# Patient Record
Sex: Female | Born: 1960 | Race: White | Hispanic: No | Smoking: Never smoker
Health system: Southern US, Community
[De-identification: ages and names within clinical notes are randomized; demographics above are authoritative.]

## PROBLEM LIST (undated history)

## (undated) DIAGNOSIS — R32 Unspecified urinary incontinence: Secondary | ICD-10-CM

## (undated) DIAGNOSIS — I499 Cardiac arrhythmia, unspecified: Secondary | ICD-10-CM

## (undated) DIAGNOSIS — R928 Other abnormal and inconclusive findings on diagnostic imaging of breast: Secondary | ICD-10-CM

## (undated) DIAGNOSIS — E039 Hypothyroidism, unspecified: Secondary | ICD-10-CM

## (undated) DIAGNOSIS — I4891 Unspecified atrial fibrillation: Secondary | ICD-10-CM

## (undated) DIAGNOSIS — C801 Malignant (primary) neoplasm, unspecified: Secondary | ICD-10-CM

## (undated) HISTORY — DX: Unspecified urinary incontinence: R32

## (undated) HISTORY — PX: MAXIMUM ACCESS (MAS)POSTERIOR LUMBAR INTERBODY FUSION (PLIF) 3 LEVEL: SHX6370

## (undated) HISTORY — DX: Hypothyroidism, unspecified: E03.9

## (undated) HISTORY — DX: Malignant (primary) neoplasm, unspecified: C80.1

## (undated) HISTORY — PX: COLONOSCOPY: SHX174

## (undated) HISTORY — PX: URETHRAL SLING: SHX2621

## (undated) HISTORY — PX: BASAL CELL CARCINOMA EXCISION: SHX1214

---
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
## 1998-08-10 ENCOUNTER — Ambulatory Visit (HOSPITAL_COMMUNITY): Admission: RE | Admit: 1998-08-10 | Discharge: 1998-08-10 | Payer: Self-pay | Admitting: Gastroenterology

## 1999-02-02 ENCOUNTER — Ambulatory Visit (HOSPITAL_COMMUNITY): Admission: RE | Admit: 1999-02-02 | Discharge: 1999-02-02 | Payer: Self-pay | Admitting: Gynecology

## 2001-02-06 ENCOUNTER — Encounter: Payer: Self-pay | Admitting: Family Medicine

## 2001-02-06 ENCOUNTER — Encounter: Admission: RE | Admit: 2001-02-06 | Discharge: 2001-02-06 | Payer: Self-pay | Admitting: Family Medicine

## 2003-11-26 ENCOUNTER — Other Ambulatory Visit: Admission: RE | Admit: 2003-11-26 | Discharge: 2003-11-26 | Payer: Self-pay | Admitting: Gynecology

## 2003-11-29 HISTORY — PX: ENDOMETRIAL ABLATION: SHX621

## 2004-04-28 HISTORY — PX: PELVIC LAPAROSCOPY: SHX162

## 2004-05-04 ENCOUNTER — Ambulatory Visit (HOSPITAL_COMMUNITY): Admission: RE | Admit: 2004-05-04 | Discharge: 2004-05-04 | Payer: Self-pay | Admitting: Gynecology

## 2004-05-04 ENCOUNTER — Encounter (INDEPENDENT_AMBULATORY_CARE_PROVIDER_SITE_OTHER): Payer: Self-pay | Admitting: Specialist

## 2004-05-04 ENCOUNTER — Ambulatory Visit (HOSPITAL_BASED_OUTPATIENT_CLINIC_OR_DEPARTMENT_OTHER): Admission: RE | Admit: 2004-05-04 | Discharge: 2004-05-04 | Payer: Self-pay | Admitting: Gynecology

## 2004-11-26 ENCOUNTER — Other Ambulatory Visit: Admission: RE | Admit: 2004-11-26 | Discharge: 2004-11-26 | Payer: Self-pay | Admitting: Gynecology

## 2006-06-22 ENCOUNTER — Other Ambulatory Visit: Admission: RE | Admit: 2006-06-22 | Discharge: 2006-06-22 | Payer: Self-pay | Admitting: Gynecology

## 2007-06-27 ENCOUNTER — Other Ambulatory Visit: Admission: RE | Admit: 2007-06-27 | Discharge: 2007-06-27 | Payer: Self-pay | Admitting: Gynecology

## 2008-01-19 ENCOUNTER — Emergency Department (HOSPITAL_COMMUNITY): Admission: EM | Admit: 2008-01-19 | Discharge: 2008-01-19 | Payer: Self-pay | Admitting: Emergency Medicine

## 2008-11-27 ENCOUNTER — Emergency Department (HOSPITAL_BASED_OUTPATIENT_CLINIC_OR_DEPARTMENT_OTHER): Admission: EM | Admit: 2008-11-27 | Discharge: 2008-11-27 | Payer: Self-pay | Admitting: Emergency Medicine

## 2008-11-27 ENCOUNTER — Ambulatory Visit: Payer: Self-pay | Admitting: Diagnostic Radiology

## 2009-06-10 ENCOUNTER — Encounter: Payer: Self-pay | Admitting: Gynecology

## 2009-06-10 ENCOUNTER — Ambulatory Visit: Payer: Self-pay | Admitting: Gynecology

## 2009-06-10 ENCOUNTER — Other Ambulatory Visit: Admission: RE | Admit: 2009-06-10 | Discharge: 2009-06-10 | Payer: Self-pay | Admitting: Gynecology

## 2009-07-02 ENCOUNTER — Ambulatory Visit: Payer: Self-pay | Admitting: Gynecology

## 2010-10-05 ENCOUNTER — Ambulatory Visit: Payer: Self-pay | Admitting: Gynecology

## 2010-10-05 ENCOUNTER — Other Ambulatory Visit: Admission: RE | Admit: 2010-10-05 | Discharge: 2010-10-05 | Payer: Self-pay | Admitting: Gynecology

## 2010-11-28 DIAGNOSIS — C801 Malignant (primary) neoplasm, unspecified: Secondary | ICD-10-CM

## 2010-11-28 HISTORY — DX: Malignant (primary) neoplasm, unspecified: C80.1

## 2010-12-19 ENCOUNTER — Encounter: Payer: Self-pay | Admitting: Gynecology

## 2011-04-15 NOTE — H&P (Signed)
NAME:  Emily Nunez, Emily Nunez                         ACCOUNT NO.:  [id number]   MEDICAL RECORD NO.:  [id number]                   PATIENT TYPE:  AMB   LOCATION:  [facility]                                 FACILITY:  [facility]   PHYSICIAN:  Timothy P. Fontaine, M.D.           DATE OF BIRTH:  Jan 21, 1961   DATE OF ADMISSION:  05/04/2004  DATE OF DISCHARGE:                                HISTORY & PHYSICAL   CHIEF COMPLAINT:  Left-sided pain.   HISTORY OF PRESENT ILLNESS:  A 50 year old, G3, P3 female, vasectomy birth  control, history of endometrial ablation with left-sided pelvic pain.  The  patient notes intermittent sharp stabbing to aching pain since the fall, has  been evaluated with several ultrasounds showing normal pelvic pathology, no  significant ovarian disease.  She underwent abdominopelvic ultrasound which  was normal.  The patient is admitted at this time for diagnostic laparoscopy  for her pain.   PAST MEDICAL HISTORY:  Hypothyroidism.   PAST SURGICAL HISTORY:  1. Cryosurgery of the cervix.  2. Spontaneous vaginal delivery x 2.  3. Hysteroscopic myomectomy.  4. Endometrial ablation.   MEDICATIONS:  Synthroid.   ALLERGIES:  None.   REVIEW OF SYSTEMS:  Noncontributory.   SOCIAL HISTORY:  Noncontributory.   FAMILY HISTORY:  Noncontributory.   ADMISSION PHYSICAL EXAMINATION:  VITAL SIGNS:  Afebrile.  Vital signs are  stable.  HEENT:  Normal.  LUNGS:  Clear.  CARDIAC:  Regular rate without rubs, murmurs, or gallops.  ABDOMEN:  Benign.  PELVIC:  External BUS vagina normal.  Cervix normal.  Uterus grossly normal  size, nontender.  Adnexa without masses or tenderness.   ASSESSMENT AND PLAN:  A 50 year old, G3, P3 female, vasectomy birth control,  status post endometrial ablation, left-sided abdominopelvic pain since the  fall, normal ultrasound, normal CT for diagnostic laparoscopy.  The risks,  benefits, indications, and alternatives for the procedure were reviewed with  the  patient to include the expected intraoperative, postoperative courses.  The patient understands there are no guarantees as far as pain relief is  concerned.  The pain may persist, worsen, or change following the procedure,  and she understands and accepts this.  I reviewed instrumentation,  insufflation, trocar placement, multiple port site, use of sharp, blunt  dissection, electrocautery, and the use of laser.  The risks of infection  both incisional requiring opening and draining of incisions as well as  internal requiring prolonged antibiotics and abscess formation were all  discussed, understood, and accepted.  The risks of hemorrhage necessitating  transfusion and the risk of transfusion including transfusion reaction,  hepatitis, HIV, mad cow disease, and other unknown entities were all  discussed, understood, and accepted.  The risks of inadvertent injury to  internal organs could involve bladder, ureters, vessels, and nerves  necessitating major exploratory reparative surgeries and future reparative  surgeries including ostomy formation were all discussed, understood, and  accepted.  I discussed immediate  recognition versus delayed recognition of  complications and, again, we discussed there were no guarantees as far as  pain relief, and she understands and accepts this.  The patient's questions  are answered to her satisfaction.  She is ready to proceed with surgery.                                               Timothy P. Audie Box, M.D.    TPF/MEDQ  D:  05/02/2004  T:  05/02/2004  Job:  [id number]

## 2011-04-15 NOTE — Op Note (Signed)
NAME:  Emily Nunez, Emily Nunez                         ACCOUNT NO.:  [id number]   MEDICAL RECORD NO.:  [id number]                   PATIENT TYPE:  AMB   LOCATION:  [facility]                                 FACILITY:  [facility]   PHYSICIAN:  Timothy P. Fontaine, M.D.           DATE OF BIRTH:  01/10/1961   DATE OF PROCEDURE:  05/04/2004  DATE OF DISCHARGE:                                 OPERATIVE REPORT   PREOPERATIVE DIAGNOSIS:  Pelvic pain.   POSTOPERATIVE DIAGNOSES:  1. Endometriosis.  2. Right ovarian cyst, rule out endometrioma.   PROCEDURE:  Laparoscopic right ovarian cystectomy, biopsy fulguration of  endometriosis.   SURGEON:  Timothy P. Fontaine, M.D.   ANESTHESIA:  General.   ESTIMATED BLOOD LOSS:  Minimal.   COMPLICATIONS:  None.   SPECIMENS:  1. Peritoneal biopsy.  2. Right ovarian cyst.   FINDINGS:  CUL-DE-SAC:  Anterior cul-de-sac normal.  Posterior cul-de-sac  with four areas classic, shaggy, red superficial endometriosis; right side,  medial to the uterosacral ligament -- represented area of biopsy and  subsequent all visible areas bipolar cauterized.  FALLOPIAN TUBES:  Right and left fallopian tubes normal in caliber to  fimbriated ends.  OVARIES:  Left ovary grossly normal, free and mobile.  Right ovary with 2 cm  dark, hemorrhagic-appearing cyst -- excised and filled with dark clot.  Rule  out endometrioma.  UTERUS:  Normal size, shape and contour.  No pelvic adhesions noted, and no  other areas of endometriosis.  UPPER ABDOMINAL EXAMINATION:  Appendix grossly normal, free and mobile.  Liver smooth with no abnormalities.  Gallbladder not visualized, but no  upper abdominal abnormalities grossly visualized.   DESCRIPTION OF PROCEDURE:  The patient was taken to the operating room and  underwent general anesthesia.  She was placed in the dorsal lithotomy  position and received an abdominal, perineal, vaginal preparation with  Betadine solution.  The bladder emptied  with an in/out Foley  catheterization.  EUA performed.   The Hulka tenaculum was placed on the cervix, and the patient was then  draped in the usual fashion.  A vertical infraumbilical incision was then  made.  The Veress needle placed and position verified with water;  approximately 3 L of carbon dioxide gas were infused without difficulty.  The 10 mm laparoscopic trocar was then placed without difficulty and its  position verified visually.  A midline 5 mm suprapubic port was then placed  under direct visualization, after transillumination for the vessels without  difficulty.  Examination of the pelvic organs and an upper abdominal  examination was carried out, with findings noted above.  A representative  area of the posterior cul-de-sac endometriosis was biopsied, and  subsequently all visible areas were then bipolar coagulated superficially.   The right ovary was then grasped and stabilized, and at the junction of the  cyst and the normal-appearing ovary, the capsule was sharply incised and  subsequently the cyst  was excised.  The cyst was ruptured and noted to be  filled with clot, consistent either with endometrioma versus a clotted  corpus luteal.  The cyst wall was then removed and sent to pathology.  The  pelvis was irrigated and bipolar cautery was applied to the ovarian edge  surrounding the excision area, to achieve hemostasis.  The base of the  ovarian stroma was also bipolar cauterized in a brushing technique -- both  for hemostasis and to ablate any remaining cyst wall.   The pelvis was copiously irrigated.  Adequate hemostasis was visualized at  all areas.  The suprapubic port was then removed.  The gas allowed to  escape.  All sites were reinspected under __________ situation, showing  adequate hemostasis.  The infraumbilical port was backed out under direct  visualization, showing adequate hemostasis and no evidence of hernia  formation.  A 0 Vicryl interrupted  subcutaneous fascial stitch was placed  infraumbilically.  Both port sites were injected with 0.25% Marcaine.  The  skin was reapproximated using Dermabond skin adhesive.  The Hulka tenaculum  was removed.  The patient placed in supine position and awakened without  difficulty.  She went to the recovery room in good condition, having  tolerated the procedure well.                                               Timothy P. Audie Box, M.D.    TPF/MEDQ  D:  05/04/2004  T:  05/04/2004  Job:  [id number]

## 2011-09-02 LAB — BASIC METABOLIC PANEL
BUN: 16 mg/dL (ref 6–23)
CO2: 30 mEq/L (ref 19–32)
Calcium: 9.6 mg/dL (ref 8.4–10.5)
Chloride: 103 mEq/L (ref 96–112)
Creatinine, Ser: 1 mg/dL (ref 0.4–1.2)
GFR calc Af Amer: >60 mL/min (ref >60)
GFR calc non Af Amer: 59 mL/min — ABNORMAL LOW (ref >60)
Glucose, Bld: 79 mg/dL (ref 70–99)
Potassium: 4 mEq/L (ref 3.5–5.1)
Sodium: 142 mEq/L (ref 135–145)

## 2011-09-02 LAB — URINALYSIS, ROUTINE W REFLEX MICROSCOPIC
Bilirubin Urine: NEGATIVE
Glucose, UA: NEGATIVE mg/dL
Ketones, ur: NEGATIVE mg/dL
Nitrite: NEGATIVE
Protein, ur: 30 mg/dL — AB
Specific Gravity, Urine: 1.022 (ref 1.005–1.030)
Urobilinogen, UA: 0.2 mg/dL (ref 0.0–1.0)
pH: 5.5 (ref 5.0–8.0)

## 2011-09-02 LAB — CBC
HCT: 36.9 % (ref 36.0–46.0)
Hemoglobin: 12.5 g/dL (ref 12.0–15.0)
MCHC: 33.8 g/dL (ref 30.0–36.0)
MCV: 87.9 fL (ref 78.0–100.0)
Platelets: 217 10*3/uL (ref 150–400)
RBC: 4.2 MIL/uL (ref 3.87–5.11)
RDW: 12.7 % (ref 11.5–15.5)
WBC: 7.5 10*3/uL (ref 4.0–10.5)

## 2011-09-02 LAB — URINE MICROSCOPIC-ADD ON

## 2011-09-02 LAB — PREGNANCY, URINE: Preg Test, Ur: NEGATIVE

## 2011-09-02 LAB — DIFFERENTIAL
Basophils Absolute: 0 10*3/uL (ref 0.0–0.1)
Basophils Relative: 0 % (ref 0–1)
Eosinophils Absolute: 0 10*3/uL (ref 0.0–0.7)
Eosinophils Relative: 1 % (ref 0–5)
Lymphocytes Relative: 12 % (ref 12–46)
Lymphs Abs: 0.9 10*3/uL (ref 0.7–4.0)
Monocytes Absolute: 0.5 10*3/uL (ref 0.1–1.0)
Monocytes Relative: 7 % (ref 3–12)
Neutro Abs: 6.1 10*3/uL (ref 1.7–7.7)
Neutrophils Relative %: 81 % — ABNORMAL HIGH (ref 43–77)

## 2011-09-02 LAB — URINE CULTURE: Colony Count: >=100000

## 2011-10-10 ENCOUNTER — Other Ambulatory Visit: Payer: Self-pay | Admitting: *Deleted

## 2011-10-10 MED ORDER — ESTRADIOL 0.1 MG/24HR TD PTTW: 1.0000 | MEDICATED_PATCH | TRANSDERMAL | Status: DC

## 2011-11-14 DIAGNOSIS — C801 Malignant (primary) neoplasm, unspecified: Secondary | ICD-10-CM | POA: Insufficient documentation

## 2011-11-14 DIAGNOSIS — E559 Vitamin D deficiency, unspecified: Secondary | ICD-10-CM | POA: Insufficient documentation

## 2011-11-14 DIAGNOSIS — E039 Hypothyroidism, unspecified: Secondary | ICD-10-CM | POA: Insufficient documentation

## 2011-11-23 ENCOUNTER — Ambulatory Visit (INDEPENDENT_AMBULATORY_CARE_PROVIDER_SITE_OTHER): Payer: BC Managed Care – PPO | Admitting: Gynecology

## 2011-11-23 ENCOUNTER — Encounter: Payer: Self-pay | Admitting: Gynecology

## 2011-11-23 VITALS — BP 118/74 | Ht 65.25 in | Wt 224.0 lb

## 2011-11-23 DIAGNOSIS — Z79890 Hormone replacement therapy (postmenopausal): Secondary | ICD-10-CM

## 2011-11-23 DIAGNOSIS — Z01419 Encounter for gynecological examination (general) (routine) without abnormal findings: Secondary | ICD-10-CM

## 2011-11-23 MED ORDER — PROGESTERONE MICRONIZED 100 MG PO CAPS: 100.0000 mg | ORAL_CAPSULE | Freq: Every day | ORAL | Status: DC

## 2011-11-23 MED ORDER — ESTRADIOL 0.1 MG/24HR TD PTTW: 1.0000 | MEDICATED_PATCH | TRANSDERMAL | Status: DC

## 2011-11-23 NOTE — Patient Instructions (Signed)
Continue on hormone replacement as discussed. Increase calcium vitamin D. Follow up in one year or sooner if any issues.

## 2011-11-23 NOTE — Progress Notes (Signed)
Emily Nunez 21-Mar-1961 [id number]        50 y.o.  for annual exam.  Doing well on HRT.  Past medical history,surgical history, medications, allergies, family history and social history were all reviewed and documented in the EPIC chart. ROS:  Was performed and pertinent positives and negatives are included in the history.  Exam: chaperone present Filed Vitals:   11/23/11 1438  BP: 118/74   General appearance  Normal Skin grossly normal Head/Neck normal with no cervical or supraclavicular adenopathy thyroid normal Lungs  clear Cardiac RR, without RMG Abdominal  soft, nontender, without masses, organomegaly or hernia Breasts  examined lying and sitting without masses, retractions, discharge or axillary adenopathy. Pelvic  Ext/BUS/vagina  normal   Cervix  normal    Uterus  A Deaver did, normal size, shape and contour, midline and mobile nontender   Adnexa  Without masses or tenderness    Anus and perineum  normal   Rectovaginal  normal sphincter tone without palpated masses or tenderness.    Assessment/Plan:  50 y.o. female for annual exam.    1. HRT. Patient is doing well on HRT  Vivelle-Dot 0.1 mg and Prometrium 100 mg nightly. I again reviewed the WHI study. Increased risk of stroke, heart attack, and DVT and breast cancer risk reviewed. ACOG and NAMS statements the lowest dose for shortest period of time reviewed. Possibly decreasing her dose was also reviewed. She's not interested in doing it right now andshe'll prefer to continue another year accepting the risks. I refilled her times a year. 2. Breast health. SBE monthly reviewed. She's due for mammogram now and knows to schedule it and agrees to do so. 3. Pap smear. I did not do a Pap smear today. She has no history of significantly abnormal Pap smears with a number of normal reports in her chart the last one November 2011. We'll plan on every three-year screening she's comfortable with this. 4. Bone health. Increase calcium  vitamin D reviewed. She is known to have low vitamin D level and I encouraged her to make sure she's taking her extra vitamin D. I did not recheck her vitamin D level today as she does get blood work done through her primaryand no blood work was done today. I did remind her to follow up on this when they do blood work. 5. Colonoscopy. She had a colonoscopy in 2011 with planned repeat at 5 year interval. 6. Health maintenance. Again no blood work was done today as it is done through her primary she actively sees who follows her for her cholesterol, glucose, thyroid and general health maintenance.Dara Lords MD, 3:38 PM 11/23/2011

## 2011-11-24 ENCOUNTER — Encounter: Payer: Self-pay | Admitting: Gynecology

## 2012-05-29 ENCOUNTER — Other Ambulatory Visit: Payer: Self-pay | Admitting: Gynecology

## 2012-05-29 DIAGNOSIS — Z1231 Encounter for screening mammogram for malignant neoplasm of breast: Secondary | ICD-10-CM

## 2012-06-04 ENCOUNTER — Ambulatory Visit: Payer: BC Managed Care – PPO

## 2012-06-08 ENCOUNTER — Ambulatory Visit
Admission: RE | Admit: 2012-06-08 | Discharge: 2012-06-08 | Disposition: A | Discharge status: Home / Self Care | Payer: BC Managed Care – PPO | Source: Ambulatory Visit | Attending: Gynecology | Admitting: Gynecology

## 2012-06-08 DIAGNOSIS — Z1231 Encounter for screening mammogram for malignant neoplasm of breast: Secondary | ICD-10-CM

## 2012-12-29 ENCOUNTER — Other Ambulatory Visit: Payer: Self-pay | Admitting: Gynecology

## 2012-12-31 NOTE — Telephone Encounter (Signed)
Pt will be called to schedule annual exam. KW

## 2013-02-26 ENCOUNTER — Other Ambulatory Visit (HOSPITAL_COMMUNITY)
Admission: RE | Admit: 2013-02-26 | Discharge: 2013-02-26 | Disposition: A | Discharge status: Home / Self Care | Payer: BC Managed Care – PPO | Source: Ambulatory Visit | Attending: Gynecology | Admitting: Gynecology

## 2013-02-26 ENCOUNTER — Encounter: Payer: Self-pay | Admitting: Gynecology

## 2013-02-26 ENCOUNTER — Ambulatory Visit (INDEPENDENT_AMBULATORY_CARE_PROVIDER_SITE_OTHER): Payer: BC Managed Care – PPO | Admitting: Gynecology

## 2013-02-26 VITALS — BP 124/76 | Ht 65.0 in | Wt 222.0 lb

## 2013-02-26 DIAGNOSIS — Z79890 Hormone replacement therapy (postmenopausal): Secondary | ICD-10-CM

## 2013-02-26 DIAGNOSIS — Z01419 Encounter for gynecological examination (general) (routine) without abnormal findings: Secondary | ICD-10-CM

## 2013-02-26 DIAGNOSIS — Z1151 Encounter for screening for human papillomavirus (HPV): Secondary | ICD-10-CM | POA: Insufficient documentation

## 2013-02-26 DIAGNOSIS — N951 Menopausal and female climacteric states: Secondary | ICD-10-CM

## 2013-02-26 MED ORDER — ESTRADIOL 0.1 MG/24HR TD PTTW: MEDICATED_PATCH | TRANSDERMAL | Status: DC

## 2013-02-26 MED ORDER — PROGESTERONE MICRONIZED 100 MG PO CAPS: 100.0000 mg | ORAL_CAPSULE | Freq: Every day | ORAL | Status: DC

## 2013-02-26 NOTE — Progress Notes (Signed)
Emily Nunez 21-Oct-1961 [id number]        52 y.o.  G3P3003 for annual exam.  Several issues noted below  Past medical history,surgical history, medications, allergies, family history and social history were all reviewed and documented in the EPIC chart. ROS:  Was performed and pertinent positives and negatives are included in the history.  Exam: Kim assistant Filed Vitals:   02/26/13 1603  BP: 124/76  Height: 5\' 5"  (1.651 m)  Weight: 222 lb (100.699 kg)   General appearance  Normal Skin grossly normal Head/Neck normal with no cervical or supraclavicular adenopathy thyroid normal Lungs  clear Cardiac RR, without RMG Abdominal  soft, nontender, without masses, organomegaly or hernia Breasts  examined lying and sitting without masses, retractions, discharge or axillary adenopathy. Pelvic  Ext/BUS/vagina  normal   Cervix  normal Pap/HPV  Uterus  axial, normal size, shape and contour, midline and mobile nontender   Adnexa  Without masses or tenderness    Anus and perineum  normal   Rectovaginal  normal sphincter tone without palpated masses or tenderness.    Assessment/Plan:  52 y.o. G22P3003 female for annual exam.   1. HRT. Patient on Vivelle 0.1 mg patch and Prometrium 100 mg nightly. She ran out of her medication and notes that she's having hot flashes, sweats, fatigue and just overall not feeling as well. Options to stay off the medication versus reinitiating were reviewed. I again discussed the risks to include the WHI study with increased risk of stroke heart attack DVT and breast cancer. ACOG and NAMS statements for lowest dose for shortest period of time reviewed. Patient wants to reinitiate understanding and accepting the risks. Vivelle 0.1 mg patch and from Prometrium 100 mg nightly prescribed times one year. Patient knows to report any bleeding 2. Pap smear 2011. Pap/HPV done today. No history of significant abnormal Pap smears. Assuming negative plan less frequent screening  interval. 3. Mammography 05/2012. Continue with annual mammography. SBE monthly reviewed. 4. Colonoscopy 2 years ago. Repeat at their recommended interval. 5. DEXA. We'll plan DEXA baseline closer to 60. Increase calcium vitamin D reviewed. 6. Health maintenance. Actively sees her primary physician and is due to see them. No blood work done and she will have this done through their office. I did recommend that she have a vitamin D checked when she has her blood drawn she has been noted to be low previously. Patient will followup in one year, sooner as needed.    Dara Lords MD, 5:08 PM 02/26/2013

## 2013-02-26 NOTE — Patient Instructions (Addendum)
Follow up in one year for annual exam Follow up sooner in any issues Have Dr Foy [place] check a Vitamin D level when he does blood work

## 2013-02-27 ENCOUNTER — Encounter: Payer: Self-pay | Admitting: Gynecology

## 2013-02-27 LAB — URINALYSIS W MICROSCOPIC + REFLEX CULTURE
Bilirubin Urine: NEGATIVE
Casts: NONE SEEN
Crystals: NONE SEEN
Glucose, UA: NEGATIVE mg/dL
Hgb urine dipstick: NEGATIVE
Ketones, ur: NEGATIVE mg/dL
Leukocytes, UA: NEGATIVE
Nitrite: NEGATIVE
Protein, ur: NEGATIVE mg/dL
Specific Gravity, Urine: 1.023 (ref 1.005–1.030)
Urobilinogen, UA: 0.2 mg/dL (ref 0.0–1.0)
pH: 5 (ref 5.0–8.0)

## 2013-02-28 LAB — URINE CULTURE
Colony Count: NO GROWTH
Organism ID, Bacteria: NO GROWTH

## 2013-06-03 ENCOUNTER — Other Ambulatory Visit: Payer: Self-pay | Admitting: Family Medicine

## 2013-06-04 ENCOUNTER — Other Ambulatory Visit: Payer: Self-pay | Admitting: Family Medicine

## 2013-06-04 DIAGNOSIS — Z1231 Encounter for screening mammogram for malignant neoplasm of breast: Secondary | ICD-10-CM

## 2013-06-24 ENCOUNTER — Ambulatory Visit: Payer: BC Managed Care – PPO

## 2013-06-26 ENCOUNTER — Ambulatory Visit: Payer: BC Managed Care – PPO

## 2013-07-26 ENCOUNTER — Ambulatory Visit: Payer: BC Managed Care – PPO

## 2013-08-29 ENCOUNTER — Other Ambulatory Visit: Payer: Self-pay | Admitting: Family Medicine

## 2013-08-29 DIAGNOSIS — G930 Cerebral cysts: Secondary | ICD-10-CM

## 2013-09-06 ENCOUNTER — Other Ambulatory Visit: Payer: BC Managed Care – PPO

## 2014-03-26 ENCOUNTER — Other Ambulatory Visit: Payer: Self-pay | Admitting: Gynecology

## 2014-05-19 ENCOUNTER — Encounter: Payer: Self-pay | Admitting: Gynecology

## 2014-05-19 ENCOUNTER — Ambulatory Visit (INDEPENDENT_AMBULATORY_CARE_PROVIDER_SITE_OTHER): Payer: BC Managed Care – PPO | Admitting: Gynecology

## 2014-05-19 VITALS — BP 110/70 | Ht 66.0 in | Wt 214.0 lb

## 2014-05-19 DIAGNOSIS — Z79890 Hormone replacement therapy (postmenopausal): Secondary | ICD-10-CM

## 2014-05-19 DIAGNOSIS — Z01419 Encounter for gynecological examination (general) (routine) without abnormal findings: Secondary | ICD-10-CM

## 2014-05-19 MED ORDER — ESTRADIOL 0.1 MG/24HR TD PTTW: MEDICATED_PATCH | TRANSDERMAL | Status: DC

## 2014-05-19 MED ORDER — PROGESTERONE MICRONIZED 100 MG PO CAPS: 100.0000 mg | ORAL_CAPSULE | Freq: Every day | ORAL | Status: DC

## 2014-05-19 NOTE — Progress Notes (Signed)
Emily Nunez [birth year] [id number]        52 y.o.  G3P3003 for annual exam.  Several issues noted below.  Past medical history,surgical history, problem list, medications, allergies, family history and social history were all reviewed and documented as reviewed in the EPIC chart.  ROS:  12 system ROS performed with pertinent positives and negatives included in the history, assessment and plan.  Included Systems: General, HEENT, Neck, Cardiovascular, Pulmonary, Gastrointestinal, Genitourinary, Musculoskeletal, Dermatologic, Endocrine, Hematological, Neurologic, Psychiatric Additional significant findings :  None   Exam: Kim assistant Filed Vitals:   05/19/14 1144  BP: 110/70  Height: 5\' 6"  (1.676 m)  Weight: 214 lb (97.07 kg)   General appearance:  Normal affect, orientation and appearance. Skin: Grossly normal HEENT: Without gross lesions.  No cervical or supraclavicular adenopathy. Thyroid normal.  Lungs:  Clear without wheezing, rales or rhonchi Cardiac: RR, without RMG Abdominal:  Soft, nontender, without masses, guarding, rebound, organomegaly or hernia Breasts:  Examined lying and sitting without masses, retractions, discharge or axillary adenopathy. Pelvic:  Ext/BUS/vagina normal  Cervix normal  Uterus anteverted, normal size, shape and contour, midline and mobile nontender   Adnexa  Without masses or tenderness    Anus and perineum  Normal   Rectovaginal  Normal sphincter tone without palpated masses or tenderness.    Assessment/Plan:  53 y.o. G55P3003 female for annual exam.   1. Postmenopausal/HRT. Status post endometrial ablation. Patient continues on Vivelle 0.1 mg patches and Prometrium 100 mg nightly. No vaginal bleeding. Good relief of her hot flushes and night sweats.  I again reviewed the whole issue of HRT with her to include the WHI study with increased risk of stroke, heart attack, DVT and breast cancer. The ACOG and NAMS statements for lowest dose for the  shortest period of time reviewed. Options to try to wean now versus continuing reviewed. Patient wants to continue at this point and I refilled her for both medications x1 year. Patient knows to call if any vaginal bleeding. 2. Pap smear/HPV negative 2014. No Pap smear done today. No history of significant abnormal Pap smears. Plan repeat Pap smear in 3-5 year interval per current screening guidelines. 3. Mammography 05/2012. Patient knows she is overdue. Agrees to schedule now. SBE monthly reviewed. 4. Colonoscopy 2013. Repeat at their recommended interval. 5. DEXA never. Will plan at age 3. Increase calcium vitamin D reviewed. 6. Health maintenance. No routine blood work done as patient has this done through her primary physician's office. Followup one year, sooner as needed.   Note: This document was prepared with digital dictation and possible smart phrase technology. Any transcriptional errors that result from this process are unintentional.   Anastasio Auerbach MD, 12:36 PM 05/19/2014

## 2014-05-19 NOTE — Patient Instructions (Signed)
Followup in one year for annual exam. Call if any vaginal bleeding or issues with your hormone replacement.  You may obtain a copy of any labs that were done today by logging onto MyChart as outlined in the instructions provided with your AVS (after visit summary). The office will not call with normal lab results but certainly if there are any significant abnormalities then we will contact you.   Health Maintenance, Female A healthy lifestyle and preventative care can promote health and wellness.  Maintain regular health, dental, and eye exams.  Eat a healthy diet. Foods like vegetables, fruits, whole grains, low-fat dairy products, and lean protein foods contain the nutrients you need without too many calories. Decrease your intake of foods high in solid fats, added sugars, and salt. Get information about a proper diet from your caregiver, if necessary.  Regular physical exercise is one of the most important things you can do for your health. Most adults should get at least 150 minutes of moderate-intensity exercise (any activity that increases your heart rate and causes you to sweat) each week. In addition, most adults need muscle-strengthening exercises on 2 or more days a week.   Maintain a healthy weight. The body mass index (BMI) is a screening tool to identify possible weight problems. It provides an estimate of body fat based on height and weight. Your caregiver can help determine your BMI, and can help you achieve or maintain a healthy weight. For adults 20 years and older:  A BMI below 18.5 is considered underweight.  A BMI of 18.5 to 24.9 is normal.  A BMI of 25 to 29.9 is considered overweight.  A BMI of 30 and above is considered obese.  Maintain normal blood lipids and cholesterol by exercising and minimizing your intake of saturated fat. Eat a balanced diet with plenty of fruits and vegetables. Blood tests for lipids and cholesterol should begin at age 54 and be repeated every  5 years. If your lipid or cholesterol levels are high, you are over 50, or you are a high risk for heart disease, you may need your cholesterol levels checked more frequently.Ongoing high lipid and cholesterol levels should be treated with medicines if diet and exercise are not effective.  If you smoke, find out from your caregiver how to quit. If you do not use tobacco, do not start.  Lung cancer screening is recommended for adults aged 12 80 years who are at high risk for developing lung cancer because of a history of smoking. Yearly low-dose computed tomography (CT) is recommended for people who have at least a 30-pack-year history of smoking and are a current smoker or have quit within the past 15 years. A pack year of smoking is smoking an average of 1 pack of cigarettes a day for 1 year (for example: 1 pack a day for 30 years or 2 packs a day for 15 years). Yearly screening should continue until the smoker has stopped smoking for at least 15 years. Yearly screening should also be stopped for people who develop a health problem that would prevent them from having lung cancer treatment.  If you are pregnant, do not drink alcohol. If you are breastfeeding, be very cautious about drinking alcohol. If you are not pregnant and choose to drink alcohol, do not exceed 1 drink per day. One drink is considered to be 12 ounces (355 mL) of beer, 5 ounces (148 mL) of wine, or 1.5 ounces (44 mL) of liquor.  Avoid use of  street drugs. Do not share needles with anyone. Ask for help if you need support or instructions about stopping the use of drugs.  High blood pressure causes heart disease and increases the risk of stroke. Blood pressure should be checked at least every 1 to 2 years. Ongoing high blood pressure should be treated with medicines, if weight loss and exercise are not effective.  If you are 55 to 53 years old, ask your caregiver if you should take aspirin to prevent strokes.  Diabetes screening  involves taking a blood sample to check your fasting blood sugar level. This should be done once every 3 years, after age 45, if you are within normal weight and without risk factors for diabetes. Testing should be considered at a younger age or be carried out more frequently if you are overweight and have at least 1 risk factor for diabetes.  Breast cancer screening is essential preventative care for women. You should practice "breast self-awareness." This means understanding the normal appearance and feel of your breasts and may include breast self-examination. Any changes detected, no matter how small, should be reported to a caregiver. Women in their 20s and 30s should have a clinical breast exam (CBE) by a caregiver as part of a regular health exam every 1 to 3 years. After age 40, women should have a CBE every year. Starting at age 40, women should consider having a mammogram (breast X-ray) every year. Women who have a family history of breast cancer should talk to their caregiver about genetic screening. Women at a high risk of breast cancer should talk to their caregiver about having an MRI and a mammogram every year.  Breast cancer gene (BRCA)-related cancer risk assessment is recommended for women who have family members with BRCA-related cancers. BRCA-related cancers include breast, ovarian, tubal, and peritoneal cancers. Having family members with these cancers may be associated with an increased risk for harmful changes (mutations) in the breast cancer genes BRCA1 and BRCA2. Results of the assessment will determine the need for genetic counseling and BRCA1 and BRCA2 testing.  The Pap test is a screening test for cervical cancer. Women should have a Pap test starting at age 21. Between ages 21 and 29, Pap tests should be repeated every 2 years. Beginning at age 30, you should have a Pap test every 3 years as long as the past 3 Pap tests have been normal. If you had a hysterectomy for a problem that  was not cancer or a condition that could lead to cancer, then you no longer need Pap tests. If you are between ages 65 and 70, and you have had normal Pap tests going back 10 years, you no longer need Pap tests. If you have had past treatment for cervical cancer or a condition that could lead to cancer, you need Pap tests and screening for cancer for at least 20 years after your treatment. If Pap tests have been discontinued, risk factors (such as a new sexual partner) need to be reassessed to determine if screening should be resumed. Some women have medical problems that increase the chance of getting cervical cancer. In these cases, your caregiver may recommend more frequent screening and Pap tests.  The human papillomavirus (HPV) test is an additional test that may be used for cervical cancer screening. The HPV test looks for the virus that can cause the cell changes on the cervix. The cells collected during the Pap test can be tested for HPV. The HPV test could   be used to screen women aged 67 years and older, and should be used in women of any age who have unclear Pap test results. After the age of 12, women should have HPV testing at the same frequency as a Pap test.  Colorectal cancer can be detected and often prevented. Most routine colorectal cancer screening begins at the age of 26 and continues through age 56. However, your caregiver may recommend screening at an earlier age if you have risk factors for colon cancer. On a yearly basis, your caregiver may provide home test kits to check for hidden blood in the stool. Use of a small camera at the end of a tube, to directly examine the colon (sigmoidoscopy or colonoscopy), can detect the earliest forms of colorectal cancer. Talk to your caregiver about this at age 6, when routine screening begins. Direct examination of the colon should be repeated every 5 to 10 years through age 67, unless early forms of pre-cancerous polyps or small growths are  found.  Hepatitis C blood testing is recommended for all people born from [birth year] through 1965 and any individual with known risks for hepatitis C.  Practice safe sex. Use condoms and avoid high-risk sexual practices to reduce the spread of sexually transmitted infections (STIs). Sexually active women aged 8 and younger should be checked for Chlamydia, which is a common sexually transmitted infection. Older women with new or multiple partners should also be tested for Chlamydia. Testing for other STIs is recommended if you are sexually active and at increased risk.  Osteoporosis is a disease in which the bones lose minerals and strength with aging. This can result in serious bone fractures. The risk of osteoporosis can be identified using a bone density scan. Women ages 69 and over and women at risk for fractures or osteoporosis should discuss screening with their caregivers. Ask your caregiver whether you should be taking a calcium supplement or vitamin D to reduce the rate of osteoporosis.  Menopause can be associated with physical symptoms and risks. Hormone replacement therapy is available to decrease symptoms and risks. You should talk to your caregiver about whether hormone replacement therapy is right for you.  Use sunscreen. Apply sunscreen liberally and repeatedly throughout the day. You should seek shade when your shadow is shorter than you. Protect yourself by wearing long sleeves, pants, a wide-brimmed hat, and sunglasses year round, whenever you are outdoors.  Notify your caregiver of new moles or changes in moles, especially if there is a change in shape or color. Also notify your caregiver if a mole is larger than the size of a pencil eraser.  Stay current with your immunizations. Document Released: 05/30/2011 Document Revised: 03/11/2013 Document Reviewed: 05/30/2011 [facility] Patient Information 2014 [place].

## 2014-05-20 LAB — URINALYSIS W MICROSCOPIC + REFLEX CULTURE
Bilirubin Urine: NEGATIVE
Casts: NONE SEEN
Crystals: NONE SEEN
Glucose, UA: NEGATIVE mg/dL
Hgb urine dipstick: NEGATIVE
Ketones, ur: NEGATIVE mg/dL
Leukocytes, UA: NEGATIVE
Nitrite: NEGATIVE
Protein, ur: NEGATIVE mg/dL
Specific Gravity, Urine: 1.028 (ref 1.005–1.030)
Urobilinogen, UA: 0.2 mg/dL (ref 0.0–1.0)
pH: 6 (ref 5.0–8.0)

## 2014-05-21 ENCOUNTER — Other Ambulatory Visit: Payer: Self-pay | Admitting: Gynecology

## 2014-05-21 MED ORDER — CIPROFLOXACIN HCL 250 MG PO TABS: 250.0000 mg | ORAL_TABLET | Freq: Two times a day (BID) | ORAL | Status: DC

## 2014-05-22 LAB — URINE CULTURE: Colony Count: 30000

## 2014-06-10 ENCOUNTER — Other Ambulatory Visit: Payer: Self-pay

## 2014-06-10 DIAGNOSIS — Z1231 Encounter for screening mammogram for malignant neoplasm of breast: Secondary | ICD-10-CM

## 2014-06-12 ENCOUNTER — Ambulatory Visit: Payer: BC Managed Care – PPO

## 2014-06-16 ENCOUNTER — Other Ambulatory Visit: Payer: Self-pay | Admitting: Physician Assistant

## 2014-06-19 ENCOUNTER — Ambulatory Visit (HOSPITAL_BASED_OUTPATIENT_CLINIC_OR_DEPARTMENT_OTHER)
Admission: RE | Admit: 2014-06-19 | Payer: BC Managed Care – PPO | Source: Ambulatory Visit | Admitting: Orthopedic Surgery

## 2014-06-19 ENCOUNTER — Encounter (HOSPITAL_BASED_OUTPATIENT_CLINIC_OR_DEPARTMENT_OTHER): Admission: RE | Payer: Self-pay | Source: Ambulatory Visit

## 2014-06-19 SURGERY — EXCISION, GANGLION CYST, WRIST
Anesthesia: General | Site: Wrist | Laterality: Right

## 2014-06-27 ENCOUNTER — Ambulatory Visit
Admission: RE | Admit: 2014-06-27 | Discharge: 2014-06-27 | Disposition: A | Discharge status: Home / Self Care | Payer: BC Managed Care – PPO | Source: Ambulatory Visit

## 2014-06-27 ENCOUNTER — Encounter (INDEPENDENT_AMBULATORY_CARE_PROVIDER_SITE_OTHER): Payer: Self-pay

## 2014-06-27 DIAGNOSIS — Z1231 Encounter for screening mammogram for malignant neoplasm of breast: Secondary | ICD-10-CM

## 2014-07-03 ENCOUNTER — Other Ambulatory Visit: Payer: Self-pay | Admitting: Family Medicine

## 2014-09-29 ENCOUNTER — Encounter: Payer: Self-pay | Admitting: Gynecology

## 2015-06-06 ENCOUNTER — Other Ambulatory Visit: Payer: Self-pay | Admitting: Gynecology

## 2015-06-16 ENCOUNTER — Other Ambulatory Visit: Payer: Self-pay

## 2015-06-16 MED ORDER — ESTRADIOL 0.1 MG/24HR TD PTTW: MEDICATED_PATCH | TRANSDERMAL | Status: DC

## 2015-06-16 NOTE — Telephone Encounter (Signed)
Has CE scheduled 08/17/15.

## 2015-08-14 ENCOUNTER — Encounter: Payer: Self-pay | Admitting: Gynecology

## 2015-08-14 ENCOUNTER — Ambulatory Visit (INDEPENDENT_AMBULATORY_CARE_PROVIDER_SITE_OTHER): Payer: BC Managed Care – PPO | Admitting: Gynecology

## 2015-08-14 VITALS — BP 118/76 | Ht 66.0 in | Wt 220.0 lb

## 2015-08-14 DIAGNOSIS — Z01419 Encounter for gynecological examination (general) (routine) without abnormal findings: Secondary | ICD-10-CM | POA: Diagnosis not present

## 2015-08-14 DIAGNOSIS — Z79890 Hormone replacement therapy (postmenopausal): Secondary | ICD-10-CM | POA: Diagnosis not present

## 2015-08-14 MED ORDER — PROGESTERONE MICRONIZED 100 MG PO CAPS: 100.0000 mg | ORAL_CAPSULE | Freq: Every day | ORAL | Status: DC

## 2015-08-14 MED ORDER — ESTRADIOL 0.1 MG/24HR TD PTTW: MEDICATED_PATCH | TRANSDERMAL | Status: DC

## 2015-08-14 NOTE — Patient Instructions (Signed)
Call to Schedule your mammogram  Facilities in [place]: 1)  [facility], [place] Phone: [phone] 2)  [facility]. [address] [place] Phone: [phone] 3)  Dr. Isaiah Blakes at [facility] [address] Phone: [phone]     Mammogram A mammogram is an X-ray test to find changes in a woman's breast. You should get a mammogram if:  You are 54 years of age or older  You have risk factors.   Your doctor recommends that you have one.  BEFORE THE TEST  Do not schedule the test the week before your period, especially if your breasts are sore during this time.  On the day of your mammogram:  Wash your breasts and armpits well. After washing, do not put on any deodorant or talcum powder on until after your test.   Eat and drink as you usually do.   Take your medicines as usual.   If you are diabetic and take insulin, make sure you:   Eat before coming for your test.   Take your insulin as usual.   If you cannot keep your appointment, call before the appointment to cancel. Schedule another appointment.  TEST  You will need to undress from the waist up. You will put on a hospital gown.   Your breast will be put on the mammogram machine, and it will press firmly on your breast with a piece of plastic called a compression paddle. This will make your breast flatter so that the machine can X-ray all parts of your breast.   Both breasts will be X-rayed. Each breast will be X-rayed from above and from the side. An X-ray might need to be taken again if the picture is not good enough.   The mammogram will last about 15 to 30 minutes.  AFTER THE TEST Finding out the results of your test Ask when your test results will be ready. Make sure you get your test results.  Document Released: 02/10/2009 Document Revised: 11/03/2011 Document Reviewed: 02/10/2009 [facility] Patient  Information 2012 [place].  You may obtain a copy of any labs that were done today by logging onto MyChart as outlined in the instructions provided with your AVS (after visit summary). The office will not call with normal lab results but certainly if there are any significant abnormalities then we will contact you.   Health Maintenance Adopting a healthy lifestyle and getting preventive care can go a long way to promote health and wellness. Talk with your health care provider about what schedule of regular examinations is right for you. This is a good chance for you to check in with your provider about disease prevention and staying healthy. In between checkups, there are plenty of things you can do on your own. Experts have done a lot of research about which lifestyle changes and preventive measures are most likely to keep you healthy. Ask your health care provider for more information. WEIGHT AND DIET  Eat a healthy diet  Be sure to include plenty of vegetables, fruits, low-fat dairy products, and lean protein.  Do not eat a lot of foods high in solid fats, added sugars, or salt.  Get regular exercise. This is one of the most important things you can do for your health.  Most adults should exercise for at least 150 minutes each week. The exercise should increase your heart rate and make you sweat (moderate-intensity exercise).  Most adults should also do strengthening exercises at least twice a week. This is in addition to the moderate-intensity exercise.  Maintain a healthy weight  Body mass index (BMI) is a measurement that can be used to identify possible weight problems. It estimates body fat based on height and weight. Your health care provider can help determine your BMI and help you achieve or maintain a healthy weight.  For females 93 years of age and older:   A BMI below 18.5 is considered underweight.  A BMI of 18.5 to 24.9 is normal.  A BMI of 25 to 29.9 is  considered overweight.  A BMI of 30 and above is considered obese.  Watch levels of cholesterol and blood lipids  You should start having your blood tested for lipids and cholesterol at 54 years of age, then have this test every 5 years.  You may need to have your cholesterol levels checked more often if:  Your lipid or cholesterol levels are high.  You are older than 54 years of age.  You are at high risk for heart disease.  CANCER SCREENING   Lung Cancer  Lung cancer screening is recommended for adults 71-65 years old who are at high risk for lung cancer because of a history of smoking.  A yearly low-dose CT scan of the lungs is recommended for people who:  Currently smoke.  Have quit within the past 15 years.  Have at least a 30-pack-year history of smoking. A pack year is smoking an average of one pack of cigarettes a day for 1 year.  Yearly screening should continue until it has been 15 years since you quit.  Yearly screening should stop if you develop a health problem that would prevent you from having lung cancer treatment.  Breast Cancer  Practice breast self-awareness. This means understanding how your breasts normally appear and feel.  It also means doing regular breast self-exams. Let your health care provider know about any changes, no matter how small.  If you are in your 20s or 30s, you should have a clinical breast exam (CBE) by a health care provider every 1-3 years as part of a regular health exam.  If you are 77 or older, have a CBE every year. Also consider having a breast X-ray (mammogram) every year.  If you have a family history of breast cancer, talk to your health care provider about genetic screening.  If you are at high risk for breast cancer, talk to your health care provider about having an MRI and a mammogram every year.  Breast cancer gene (BRCA) assessment is recommended for women who have family members with BRCA-related cancers.  BRCA-related cancers include:  Breast.  Ovarian.  Tubal.  Peritoneal cancers.  Results of the assessment will determine the need for genetic counseling and BRCA1 and BRCA2 testing. Cervical Cancer Routine pelvic examinations to screen for cervical cancer are no longer recommended for nonpregnant women who are considered low risk for cancer of the pelvic organs (ovaries, uterus, and vagina) and who do not have symptoms. A pelvic examination may be necessary if you have symptoms including those associated with pelvic infections. Ask your health care provider if a screening pelvic exam is right for you.   The Pap test is the screening test for cervical cancer for women who are considered at risk.  If you had a hysterectomy for a problem that was not cancer or a condition that could lead to cancer, then you no longer need  Pap tests.  If you are older than 65 years, and you have had normal Pap tests for the past 10 years, you no longer need to have Pap tests.  If you have had past treatment for cervical cancer or a condition that could lead to cancer, you need Pap tests and screening for cancer for at least 20 years after your treatment.  If you no longer get a Pap test, assess your risk factors if they change (such as having a new sexual partner). This can affect whether you should start being screened again.  Some women have medical problems that increase their chance of getting cervical cancer. If this is the case for you, your health care provider may recommend more frequent screening and Pap tests.  The human papillomavirus (HPV) test is another test that may be used for cervical cancer screening. The HPV test looks for the virus that can cause cell changes in the cervix. The cells collected during the Pap test can be tested for HPV.  The HPV test can be used to screen women 71 years of age and older. Getting tested for HPV can extend the interval between normal Pap tests from three to  five years.  An HPV test also should be used to screen women of any age who have unclear Pap test results.  After 54 years of age, women should have HPV testing as often as Pap tests.  Colorectal Cancer  This type of cancer can be detected and often prevented.  Routine colorectal cancer screening usually begins at 54 years of age and continues through 54 years of age.  Your health care provider may recommend screening at an earlier age if you have risk factors for colon cancer.  Your health care provider may also recommend using home test kits to check for hidden blood in the stool.  A small camera at the end of a tube can be used to examine your colon directly (sigmoidoscopy or colonoscopy). This is done to check for the earliest forms of colorectal cancer.  Routine screening usually begins at age 78.  Direct examination of the colon should be repeated every 5-10 years through 54 years of age. However, you may need to be screened more often if early forms of precancerous polyps or small growths are found. Skin Cancer  Check your skin from head to toe regularly.  Tell your health care provider about any new moles or changes in moles, especially if there is a change in a mole's shape or color.  Also tell your health care provider if you have a mole that is larger than the size of a pencil eraser.  Always use sunscreen. Apply sunscreen liberally and repeatedly throughout the day.  Protect yourself by wearing long sleeves, pants, a wide-brimmed hat, and sunglasses whenever you are outside. HEART DISEASE, DIABETES, AND HIGH BLOOD PRESSURE   Have your blood pressure checked at least every 1-2 years. High blood pressure causes heart disease and increases the risk of stroke.  If you are between 52 years and 72 years old, ask your health care provider if you should take aspirin to prevent strokes.  Have regular diabetes screenings. This involves taking a blood sample to check your  fasting blood sugar level.  If you are at a normal weight and have a low risk for diabetes, have this test once every three years after 54 years of age.  If you are overweight and have a high risk for diabetes, consider being tested at  a younger age or more often. PREVENTING INFECTION  Hepatitis B  If you have a higher risk for hepatitis B, you should be screened for this virus. You are considered at high risk for hepatitis B if:  You were born in a country where hepatitis B is common. Ask your health care Ricca Melgarejo which countries are considered high risk.  Your parents were born in a high-risk country, and you have not been immunized against hepatitis B (hepatitis B vaccine).  You have HIV or AIDS.  You use needles to inject street drugs.  You live with someone who has hepatitis B.  You have had sex with someone who has hepatitis B.  You get hemodialysis treatment.  You take certain medicines for conditions, including cancer, organ transplantation, and autoimmune conditions. Hepatitis C  Blood testing is recommended for:  Everyone born from [birth year] through 1965.  Anyone with known risk factors for hepatitis C. Sexually transmitted infections (STIs)  You should be screened for sexually transmitted infections (STIs) including gonorrhea and chlamydia if:  You are sexually active and are younger than 54 years of age.  You are older than 54 years of age and your health care Laveda Demedeiros tells you that you are at risk for this type of infection.  Your sexual activity has changed since you were last screened and you are at an increased risk for chlamydia or gonorrhea. Ask your health care Makyi Ledo if you are at risk.  If you do not have HIV, but are at risk, it may be recommended that you take a prescription medicine daily to prevent HIV infection. This is called pre-exposure prophylaxis (PrEP). You are considered at risk if:  You are sexually active and do not regularly use condoms or  know the HIV status of your partner(s).  You take drugs by injection.  You are sexually active with a partner who has HIV. Talk with your health care Keshana Klemz about whether you are at high risk of being infected with HIV. If you choose to begin PrEP, you should first be tested for HIV. You should then be tested every 3 months for as long as you are taking PrEP.  PREGNANCY   If you are premenopausal and you may become pregnant, ask your health care Robet Crutchfield about preconception counseling.  If you may become pregnant, take 400 to 800 micrograms (mcg) of folic acid every day.  If you want to prevent pregnancy, talk to your health care Nolon Yellin about birth control (contraception). OSTEOPOROSIS AND MENOPAUSE   Osteoporosis is a disease in which the bones lose minerals and strength with aging. This can result in serious bone fractures. Your risk for osteoporosis can be identified using a bone density scan.  If you are 31 years of age or older, or if you are at risk for osteoporosis and fractures, ask your health care Phallon Haydu if you should be screened.  Ask your health care Deshane Cotroneo whether you should take a calcium or vitamin D supplement to lower your risk for osteoporosis.  Menopause may have certain physical symptoms and risks.  Hormone replacement therapy may reduce some of these symptoms and risks. Talk to your health care Khori Underberg about whether hormone replacement therapy is right for you.  HOME CARE INSTRUCTIONS   Schedule regular health, dental, and eye exams.  Stay current with your immunizations.   Do not use any tobacco products including cigarettes, chewing tobacco, or electronic cigarettes.  If you are pregnant, do not drink alcohol.  If you are breastfeeding,  limit how much and how often you drink alcohol.  Limit alcohol intake to no more than 1 drink per day for nonpregnant women. One drink equals 12 ounces of beer, 5 ounces of wine, or 1 ounces of hard liquor.  Do  not use street drugs.  Do not share needles.  Ask your health care Stryder Poitra for help if you need support or information about quitting drugs.  Tell your health care Jacelyn Cuen if you often feel depressed.  Tell your health care Shandon Burlingame if you have ever been abused or do not feel safe at home. Document Released: 05/30/2011 Document Revised: 03/31/2014 Document Reviewed: 10/16/2013 [facility] Patient Information 2015 [place], [place]. This information is not intended to replace advice given to you by your health care Dioselina Brumbaugh. Make sure you discuss any questions you have with your health care Kyona Chauncey.

## 2015-08-14 NOTE — Progress Notes (Signed)
Emily Nunez [birth year] [id number]        54 y.o.  G3P3003 for annual exam.  Doing well.  Past medical history,surgical history, problem list, medications, allergies, family history and social history were all reviewed and documented as reviewed in the EPIC chart.  ROS:  Performed with pertinent positives and negatives included in the history, assessment and plan.   Additional significant findings :  none   Exam: Kim Counsellor Vitals:   08/14/15 1621  BP: 118/76  Height: 5\' 6"  (1.676 m)  Weight: 220 lb (99.791 kg)   General appearance:  Normal affect, orientation and appearance. Skin: Grossly normal HEENT: Without gross lesions.  No cervical or supraclavicular adenopathy. Thyroid normal.  Lungs:  Clear without wheezing, rales or rhonchi Cardiac: RR, without RMG Abdominal:  Soft, nontender, without masses, guarding, rebound, organomegaly or hernia Breasts:  Examined lying and sitting without masses, retractions, discharge or axillary adenopathy. Pelvic:  Ext/BUS/vagina normal with atrophic changes  Cervix normal with atrophic changes  Uterus anteverted, normal size, shape and contour, midline and mobile nontender   Adnexa  Without masses or tenderness    Anus and perineum  Normal   Rectovaginal  Normal sphincter tone without palpated masses or tenderness.    Assessment/Plan:  54 y.o. G84P3003 female for annual exam.   1. Postmenopausal/atrophic genital changes/HRT. Patient continues on Vivelle 0.1 mg patch and Prometrium 100 mg daily. Doing well and wants to continue. No history of bleeding. I again reviewed the whole issue of HRT to include the WHI study with increased risks of stroke heart attack DVT. The ACOG/NAMS recommendations for lowest dose for shortest period of time. Patient understands and wants to continue I refilled her 1 year. 2. Vitamin D deficiency.  Patient has been known to be deficient in vitamin D. Has not had a level checked recently. Check vitamin D  level today.  Plan Baseline DEXA further into the menopause. 3. Mammography 05/2014. Reminded she is overdue and she agrees to schedule. SBE monthly reviewed. 4. Colonoscopy 2013. Repeat at their recommended interval. 5. Pap smear/HPV 2014 negative. No Pap smear done today. No history of abnormal Pap smears previously. Plan repeat Pap smear at 3-5 year interval. 6. Health maintenance. No routine blood work done as this is done at her primary physician's office. Follow up 1 year, sooner as needed.   Anastasio Auerbach MD, 4:37 PM 08/14/2015

## 2015-08-15 LAB — URINALYSIS W MICROSCOPIC + REFLEX CULTURE
Bilirubin Urine: NEGATIVE
Casts: NONE SEEN [LPF]
Crystals: NONE SEEN [HPF]
Glucose, UA: NEGATIVE
Hgb urine dipstick: NEGATIVE
Ketones, ur: NEGATIVE
Leukocytes, UA: NEGATIVE
Nitrite: NEGATIVE
Protein, ur: NEGATIVE
RBC / HPF: NONE SEEN RBC/HPF (ref <=2)
Specific Gravity, Urine: 1.025 (ref 1.001–1.035)
WBC, UA: NONE SEEN WBC/HPF (ref <=5)
Yeast: NONE SEEN [HPF]
pH: 5 (ref 5.0–8.0)

## 2015-08-15 LAB — VITAMIN D 25 HYDROXY (VIT D DEFICIENCY, FRACTURES): Vit D, 25-Hydroxy: 20 ng/mL — ABNORMAL LOW (ref 30–100)

## 2015-08-16 LAB — URINE CULTURE: Colony Count: 45000

## 2015-08-28 ENCOUNTER — Other Ambulatory Visit: Payer: Self-pay | Admitting: Gynecology

## 2015-08-28 ENCOUNTER — Telehealth: Payer: Self-pay

## 2015-08-28 DIAGNOSIS — E559 Vitamin D deficiency, unspecified: Secondary | ICD-10-CM

## 2015-08-28 DIAGNOSIS — R8271 Bacteriuria: Secondary | ICD-10-CM

## 2015-08-28 NOTE — Telephone Encounter (Signed)
Encounter not needed. Documented on result note.

## 2015-09-09 ENCOUNTER — Other Ambulatory Visit: Payer: BC Managed Care – PPO

## 2015-12-25 ENCOUNTER — Other Ambulatory Visit: Payer: Self-pay | Admitting: Family Medicine

## 2015-12-25 DIAGNOSIS — E049 Nontoxic goiter, unspecified: Secondary | ICD-10-CM

## 2016-02-01 ENCOUNTER — Other Ambulatory Visit: Payer: BC Managed Care – PPO

## 2016-02-05 ENCOUNTER — Ambulatory Visit
Admission: RE | Admit: 2016-02-05 | Discharge: 2016-02-05 | Disposition: A | Discharge status: Home / Self Care | Payer: BC Managed Care – PPO | Source: Ambulatory Visit | Attending: Family Medicine | Admitting: Family Medicine

## 2016-02-05 DIAGNOSIS — E049 Nontoxic goiter, unspecified: Secondary | ICD-10-CM

## 2016-06-07 ENCOUNTER — Other Ambulatory Visit: Payer: Self-pay | Admitting: Gynecology

## 2016-06-07 DIAGNOSIS — Z1231 Encounter for screening mammogram for malignant neoplasm of breast: Secondary | ICD-10-CM

## 2016-06-24 ENCOUNTER — Ambulatory Visit
Admission: RE | Admit: 2016-06-24 | Discharge: 2016-06-24 | Disposition: A | Discharge status: Home / Self Care | Payer: BC Managed Care – PPO | Source: Ambulatory Visit | Attending: Gynecology | Admitting: Gynecology

## 2016-06-24 DIAGNOSIS — Z1231 Encounter for screening mammogram for malignant neoplasm of breast: Secondary | ICD-10-CM

## 2016-06-28 ENCOUNTER — Other Ambulatory Visit: Payer: Self-pay | Admitting: Gynecology

## 2016-06-28 DIAGNOSIS — R928 Other abnormal and inconclusive findings on diagnostic imaging of breast: Secondary | ICD-10-CM

## 2016-07-01 ENCOUNTER — Ambulatory Visit
Admission: RE | Admit: 2016-07-01 | Discharge: 2016-07-01 | Disposition: A | Discharge status: Home / Self Care | Payer: BC Managed Care – PPO | Source: Ambulatory Visit | Attending: Gynecology | Admitting: Gynecology

## 2016-07-01 DIAGNOSIS — R928 Other abnormal and inconclusive findings on diagnostic imaging of breast: Secondary | ICD-10-CM

## 2016-08-15 ENCOUNTER — Encounter: Payer: Self-pay | Admitting: Gynecology

## 2016-08-15 ENCOUNTER — Ambulatory Visit (INDEPENDENT_AMBULATORY_CARE_PROVIDER_SITE_OTHER): Payer: BC Managed Care – PPO | Admitting: Gynecology

## 2016-08-15 VITALS — BP 120/76 | Ht 65.0 in | Wt 234.0 lb

## 2016-08-15 DIAGNOSIS — Z1329 Encounter for screening for other suspected endocrine disorder: Secondary | ICD-10-CM | POA: Diagnosis not present

## 2016-08-15 DIAGNOSIS — N841 Polyp of cervix uteri: Secondary | ICD-10-CM | POA: Diagnosis not present

## 2016-08-15 DIAGNOSIS — Z01419 Encounter for gynecological examination (general) (routine) without abnormal findings: Secondary | ICD-10-CM

## 2016-08-15 DIAGNOSIS — Z79890 Hormone replacement therapy (postmenopausal): Secondary | ICD-10-CM | POA: Diagnosis not present

## 2016-08-15 DIAGNOSIS — Z23 Encounter for immunization: Secondary | ICD-10-CM | POA: Diagnosis not present

## 2016-08-15 DIAGNOSIS — Z13220 Encounter for screening for lipoid disorders: Secondary | ICD-10-CM

## 2016-08-15 DIAGNOSIS — E559 Vitamin D deficiency, unspecified: Secondary | ICD-10-CM

## 2016-08-15 MED ORDER — ESTRADIOL 0.1 MG/24HR TD PTTW: MEDICATED_PATCH | TRANSDERMAL | 12 refills | Status: DC

## 2016-08-15 MED ORDER — PROGESTERONE MICRONIZED 100 MG PO CAPS: 100.0000 mg | ORAL_CAPSULE | Freq: Every day | ORAL | 12 refills | Status: DC

## 2016-08-15 NOTE — Addendum Note (Signed)
Addended by: Nelva Nay on: 08/15/2016 04:47 PM   Modules accepted: Orders

## 2016-08-15 NOTE — Patient Instructions (Signed)
Office will call you with biopsy results from the cervix.  You may obtain a copy of any labs that were done today by logging onto MyChart as outlined in the instructions provided with your AVS (after visit summary). The office will not call with normal lab results but certainly if there are any significant abnormalities then we will contact you.   Health Maintenance Adopting a healthy lifestyle and getting preventive care can go a long way to promote health and wellness. Talk with your health care provider about what schedule of regular examinations is right for you. This is a good chance for you to check in with your provider about disease prevention and staying healthy. In between checkups, there are plenty of things you can do on your own. Experts have done a lot of research about which lifestyle changes and preventive measures are most likely to keep you healthy. Ask your health care provider for more information. WEIGHT AND DIET  Eat a healthy diet  Be sure to include plenty of vegetables, fruits, low-fat dairy products, and lean protein.  Do not eat a lot of foods high in solid fats, added sugars, or salt.  Get regular exercise. This is one of the most important things you can do for your health.  Most adults should exercise for at least 150 minutes each week. The exercise should increase your heart rate and make you sweat (moderate-intensity exercise).  Most adults should also do strengthening exercises at least twice a week. This is in addition to the moderate-intensity exercise.  Maintain a healthy weight  Body mass index (BMI) is a measurement that can be used to identify possible weight problems. It estimates body fat based on height and weight. Your health care provider can help determine your BMI and help you achieve or maintain a healthy weight.  For females 8 years of age and older:   A BMI below 18.5 is considered underweight.  A BMI of 18.5 to 24.9 is normal.  A BMI  of 25 to 29.9 is considered overweight.  A BMI of 30 and above is considered obese.  Watch levels of cholesterol and blood lipids  You should start having your blood tested for lipids and cholesterol at 55 years of age, then have this test every 5 years.  You may need to have your cholesterol levels checked more often if:  Your lipid or cholesterol levels are high.  You are older than 55 years of age.  You are at high risk for heart disease.  CANCER SCREENING   Lung Cancer  Lung cancer screening is recommended for adults 38-26 years old who are at high risk for lung cancer because of a history of smoking.  A yearly low-dose CT scan of the lungs is recommended for people who:  Currently smoke.  Have quit within the past 15 years.  Have at least a 30-pack-year history of smoking. A pack year is smoking an average of one pack of cigarettes a day for 1 year.  Yearly screening should continue until it has been 15 years since you quit.  Yearly screening should stop if you develop a health problem that would prevent you from having lung cancer treatment.  Breast Cancer  Practice breast self-awareness. This means understanding how your breasts normally appear and feel.  It also means doing regular breast self-exams. Let your health care provider know about any changes, no matter how small.  If you are in your 20s or 30s, you should have a  clinical breast exam (CBE) by a health care provider every 1-3 years as part of a regular health exam.  If you are 10 or older, have a CBE every year. Also consider having a breast X-ray (mammogram) every year.  If you have a family history of breast cancer, talk to your health care provider about genetic screening.  If you are at high risk for breast cancer, talk to your health care provider about having an MRI and a mammogram every year.  Breast cancer gene (BRCA) assessment is recommended for women who have family members with  BRCA-related cancers. BRCA-related cancers include:  Breast.  Ovarian.  Tubal.  Peritoneal cancers.  Results of the assessment will determine the need for genetic counseling and BRCA1 and BRCA2 testing. Cervical Cancer Routine pelvic examinations to screen for cervical cancer are no longer recommended for nonpregnant women who are considered low risk for cancer of the pelvic organs (ovaries, uterus, and vagina) and who do not have symptoms. A pelvic examination may be necessary if you have symptoms including those associated with pelvic infections. Ask your health care provider if a screening pelvic exam is right for you.   The Pap test is the screening test for cervical cancer for women who are considered at risk.  If you had a hysterectomy for a problem that was not cancer or a condition that could lead to cancer, then you no longer need Pap tests.  If you are older than 65 years, and you have had normal Pap tests for the past 10 years, you no longer need to have Pap tests.  If you have had past treatment for cervical cancer or a condition that could lead to cancer, you need Pap tests and screening for cancer for at least 20 years after your treatment.  If you no longer get a Pap test, assess your risk factors if they change (such as having a new sexual partner). This can affect whether you should start being screened again.  Some women have medical problems that increase their chance of getting cervical cancer. If this is the case for you, your health care provider may recommend more frequent screening and Pap tests.  The human papillomavirus (HPV) test is another test that may be used for cervical cancer screening. The HPV test looks for the virus that can cause cell changes in the cervix. The cells collected during the Pap test can be tested for HPV.  The HPV test can be used to screen women 11 years of age and older. Getting tested for HPV can extend the interval between normal Pap  tests from three to five years.  An HPV test also should be used to screen women of any age who have unclear Pap test results.  After 55 years of age, women should have HPV testing as often as Pap tests.  Colorectal Cancer  This type of cancer can be detected and often prevented.  Routine colorectal cancer screening usually begins at 55 years of age and continues through 55 years of age.  Your health care provider may recommend screening at an earlier age if you have risk factors for colon cancer.  Your health care provider may also recommend using home test kits to check for hidden blood in the stool.  A small camera at the end of a tube can be used to examine your colon directly (sigmoidoscopy or colonoscopy). This is done to check for the earliest forms of colorectal cancer.  Routine screening usually begins at  age 56.  Direct examination of the colon should be repeated every 5-10 years through 55 years of age. However, you may need to be screened more often if early forms of precancerous polyps or small growths are found. Skin Cancer  Check your skin from head to toe regularly.  Tell your health care provider about any new moles or changes in moles, especially if there is a change in a mole's shape or color.  Also tell your health care provider if you have a mole that is larger than the size of a pencil eraser.  Always use sunscreen. Apply sunscreen liberally and repeatedly throughout the day.  Protect yourself by wearing long sleeves, pants, a wide-brimmed hat, and sunglasses whenever you are outside. HEART DISEASE, DIABETES, AND HIGH BLOOD PRESSURE   Have your blood pressure checked at least every 1-2 years. High blood pressure causes heart disease and increases the risk of stroke.  If you are between 58 years and 16 years old, ask your health care provider if you should take aspirin to prevent strokes.  Have regular diabetes screenings. This involves taking a blood sample  to check your fasting blood sugar level.  If you are at a normal weight and have a low risk for diabetes, have this test once every three years after 55 years of age.  If you are overweight and have a high risk for diabetes, consider being tested at a younger age or more often. PREVENTING INFECTION  Hepatitis B  If you have a higher risk for hepatitis B, you should be screened for this virus. You are considered at high risk for hepatitis B if:  You were born in a country where hepatitis B is common. Ask your health care provider which countries are considered high risk.  Your parents were born in a high-risk country, and you have not been immunized against hepatitis B (hepatitis B vaccine).  You have HIV or AIDS.  You use needles to inject street drugs.  You live with someone who has hepatitis B.  You have had sex with someone who has hepatitis B.  You get hemodialysis treatment.  You take certain medicines for conditions, including cancer, organ transplantation, and autoimmune conditions. Hepatitis C  Blood testing is recommended for:  Everyone born from [birth year] through 1965.  Anyone with known risk factors for hepatitis C. Sexually transmitted infections (STIs)  You should be screened for sexually transmitted infections (STIs) including gonorrhea and chlamydia if:  You are sexually active and are younger than 55 years of age.  You are older than 55 years of age and your health care provider tells you that you are at risk for this type of infection.  Your sexual activity has changed since you were last screened and you are at an increased risk for chlamydia or gonorrhea. Ask your health care provider if you are at risk.  If you do not have HIV, but are at risk, it may be recommended that you take a prescription medicine daily to prevent HIV infection. This is called pre-exposure prophylaxis (PrEP). You are considered at risk if:  You are sexually active and do not regularly  use condoms or know the HIV status of your partner(s).  You take drugs by injection.  You are sexually active with a partner who has HIV. Talk with your health care provider about whether you are at high risk of being infected with HIV. If you choose to begin PrEP, you should first be tested for HIV. You  should then be tested every 3 months for as long as you are taking PrEP.  PREGNANCY   If you are premenopausal and you may become pregnant, ask your health care provider about preconception counseling.  If you may become pregnant, take 400 to 800 micrograms (mcg) of folic acid every day.  If you want to prevent pregnancy, talk to your health care provider about birth control (contraception). OSTEOPOROSIS AND MENOPAUSE   Osteoporosis is a disease in which the bones lose minerals and strength with aging. This can result in serious bone fractures. Your risk for osteoporosis can be identified using a bone density scan.  If you are 65 years of age or older, or if you are at risk for osteoporosis and fractures, ask your health care provider if you should be screened.  Ask your health care provider whether you should take a calcium or vitamin D supplement to lower your risk for osteoporosis.  Menopause may have certain physical symptoms and risks.  Hormone replacement therapy may reduce some of these symptoms and risks. Talk to your health care provider about whether hormone replacement therapy is right for you.  HOME CARE INSTRUCTIONS   Schedule regular health, dental, and eye exams.  Stay current with your immunizations.   Do not use any tobacco products including cigarettes, chewing tobacco, or electronic cigarettes.  If you are pregnant, do not drink alcohol.  If you are breastfeeding, limit how much and how often you drink alcohol.  Limit alcohol intake to no more than 1 drink per day for nonpregnant women. One drink equals 12 ounces of beer, 5 ounces of wine, or 1 ounces of hard  liquor.  Do not use street drugs.  Do not share needles.  Ask your health care provider for help if you need support or information about quitting drugs.  Tell your health care provider if you often feel depressed.  Tell your health care provider if you have ever been abused or do not feel safe at home. Document Released: 05/30/2011 Document Revised: 03/31/2014 Document Reviewed: 10/16/2013 ExitCare Patient Information 2015 [employer]. This information is not intended to replace advice given to you by your health care provider. Make sure you discuss any questions you have with your health care provider.  

## 2016-08-15 NOTE — Progress Notes (Signed)
    Emily Nunez Sep 28, 1961 [id number]        55 y.o.  G3P3003  for annual exam.  Several issues noted below.  Past medical history,surgical history, problem list, medications, allergies, family history and social history were all reviewed and documented as reviewed in the EPIC chart.  ROS:  Performed with pertinent positives and negatives included in the history, assessment and plan.   Additional significant findings :  None   Exam: [name] assistant Vitals:   08/15/16 1605  BP: 120/76  Weight: 234 lb (106.1 kg)  Height: 5\' 5"  (1.651 m)   Body mass index is 38.94 kg/m.  General appearance:  Normal affect, orientation and appearance. Skin: Grossly normal HEENT: Without gross lesions.  No cervical or supraclavicular adenopathy. Thyroid normal.  Lungs:  Clear without wheezing, rales or rhonchi Cardiac: RR, without RMG Abdominal:  Soft, nontender, without masses, guarding, rebound, organomegaly or hernia Breasts:  Examined lying and sitting without masses, retractions, discharge or axillary adenopathy. Pelvic:  Ext/BUS/Vagina normal with mild atrophic changes  Cervix with polypoid mass extruding from the endocervical canal. Pap smear done and subsequently mass was biopsied off noting to be filled with mucoid material. Monsel's solution hemostasis applied afterwards. Specimen sent to pathology  Uterus difficult to palpate but grossly normal in size midline mobile nontender  Adnexa without gross masses or tenderness    Anus and perineum normal   Rectovaginal normal sphincter tone without palpated masses or tenderness.    Assessment/Plan:  55 y.o. G10P3003 female for annual exam.   1. Postmenopausal/atrophic changes/HRT. Continues on Vivelle 0.1 mg patch and Prometrium 100 mg daily. I reviewed the most current 2017 NAMS guidelines on HRT. I discussed possible benefits from a symptom relief and cardiovascular/bone health when started early. Risks to include thrombosis such as  stroke heart attack DVT and possible breast cancer was also reviewed. The patient feels she is getting good benefits and wants to continue. Understands and accepts the risks. Refill 1 year provided for both. She is doing no bleeding and knows the importance of calling with bleeding. 2. Mucoid type endocervical polyp. Biopsied off and sent to pathology. I did a Pap smear before hand. Patient will follow up for biopsy results. 3. Vitamin D deficiency. Has history of vitamin D deficiency in the past. Vitamin D level checked today. 4. Mammography with subsequent call back 06/2016. They saw a benign-appearing cyst and recommended follow up mammography in one year. SBE monthly reviewed. 5. Colonoscopy 2013. Repeat at their recommended interval. 6. Pap smear/HPV 2014 negative. Pap smear repeated today given the cervical polyp. No history of abnormal Pap smears previously. 7. Health maintenance. Patient requests a sliding labs. CBC, CMP, lipid profile, TSH, vitamin D, urinalysis ordered. Follow up 1 year, sooner as needed.  Additional time in excess of her routine gynecologic exam was spent in direct face to face counseling and coordination of care in regards to her hormone replacement therapy, review of latest 2017 guidelines and ultimate decision making process to continue.   Anastasio Auerbach MD, 4:35 PM 08/15/2016

## 2016-08-16 LAB — PAP IG W/ RFLX HPV ASCU

## 2016-08-17 LAB — PATHOLOGY

## 2017-06-26 ENCOUNTER — Other Ambulatory Visit: Payer: Self-pay | Admitting: Gynecology

## 2017-06-26 DIAGNOSIS — Z1231 Encounter for screening mammogram for malignant neoplasm of breast: Secondary | ICD-10-CM

## 2017-07-03 ENCOUNTER — Ambulatory Visit
Admission: RE | Admit: 2017-07-03 | Discharge: 2017-07-03 | Disposition: A | Discharge status: Home / Self Care | Payer: BC Managed Care – PPO | Source: Ambulatory Visit | Attending: Gynecology | Admitting: Gynecology

## 2017-07-03 DIAGNOSIS — Z1231 Encounter for screening mammogram for malignant neoplasm of breast: Secondary | ICD-10-CM

## 2017-08-16 ENCOUNTER — Encounter: Payer: BC Managed Care – PPO | Admitting: Gynecology

## 2017-09-11 ENCOUNTER — Other Ambulatory Visit: Payer: Self-pay | Admitting: Gynecology

## 2017-09-12 NOTE — Telephone Encounter (Signed)
Annual scheduled on 10/26/17

## 2017-10-26 ENCOUNTER — Ambulatory Visit: Payer: BC Managed Care – PPO | Admitting: Gynecology

## 2017-10-26 ENCOUNTER — Encounter: Payer: Self-pay | Admitting: Gynecology

## 2017-10-26 VITALS — BP 124/80 | Ht 65.0 in | Wt 240.0 lb

## 2017-10-26 DIAGNOSIS — Z01411 Encounter for gynecological examination (general) (routine) with abnormal findings: Secondary | ICD-10-CM

## 2017-10-26 DIAGNOSIS — N393 Stress incontinence (female) (male): Secondary | ICD-10-CM

## 2017-10-26 DIAGNOSIS — N952 Postmenopausal atrophic vaginitis: Secondary | ICD-10-CM | POA: Diagnosis not present

## 2017-10-26 MED ORDER — ESTRADIOL 0.1 MG/24HR TD PTTW: MEDICATED_PATCH | TRANSDERMAL | 12 refills | Status: DC

## 2017-10-26 MED ORDER — PROGESTERONE MICRONIZED 100 MG PO CAPS: 100.0000 mg | ORAL_CAPSULE | Freq: Every day | ORAL | 12 refills | Status: DC

## 2017-10-26 NOTE — Progress Notes (Signed)
    Emily Nunez January 15, 1961 [id number]        56 y.o.  G3P3003 for annual gynecologic exam.  Several issues noted below.  Past medical history,surgical history, problem list, medications, allergies, family history and social history were all reviewed and documented as reviewed in the EPIC chart.  ROS:  Performed with pertinent positives and negatives included in the history, assessment and plan.   Additional significant findings : None   Exam: [name] assistant Vitals:   10/26/17 1611  BP: 124/80  Weight: 240 lb (108.9 kg)  Height: 5\' 5"  (1.651 m)   Body mass index is 39.94 kg/m.  General appearance:  Normal affect, orientation and appearance. Skin: Grossly normal HEENT: Without gross lesions.  No cervical or supraclavicular adenopathy. Thyroid normal.  Lungs:  Clear without wheezing, rales or rhonchi Cardiac: RR, without RMG Abdominal:  Soft, nontender, without masses, guarding, rebound, organomegaly or hernia Breasts:  Examined lying and sitting without masses, retractions, discharge or axillary adenopathy. Pelvic:  Ext, BUS, Vagina: With atrophic changes  Cervix: Normal  Uterus: Grossly normal size, midline mobile nontender  Adnexa: Without gross masses or tenderness    Anus and perineum: Normal   Rectovaginal: Normal sphincter tone without palpated masses or tenderness.    Assessment/Plan:  56 y.o. G2P3003 female for annual gynecologic exam.   1. Postmenopausal/atrophic genital changes/HRT.  Continues on Vivelle 0.1 mg patches and Prometrium 100 mg nightly.  We again reviewed the issue of HRT, risks versus benefits and when to wean.  Stroke heart attack DVT and breast cancer versus symptom relief, cardiovascular and bone health when started early all discussed.  At this point the patient wants to continue and I refilled her times 1 year.  No bleeding and the patient knows to call if she does any bleeding. 2. Stress urinary incontinence.  Continues to have  significant issues with stress urinary incontinence.  Has been doing Kegel exercises as well as having electrical stimulator.  Has a urologist who has been working with her in reference to this.  Recommended she follow-up with him and discuss treatment options. 3. Mammography 06/2017.  Continue with annual mammography when due.  SBE monthly reviewed.  Breast exam normal today. 4. Pap smear 2017.  No Pap smear done today.  No history of significant abnormal Pap smears.  Plan repeat Pap smear 3-year interval per current screening guidelines. 5. Colonoscopy 2013.  Repeat at their recommended interval. 6. DEXA never.  Will plan further into the menopause. 7. Health maintenance.  No routine lab work done as patient reports this done elsewhere.  Follow-up 1 year, sooner as needed.   Anastasio Auerbach MD, 4:33 PM 10/26/2017

## 2017-10-26 NOTE — Patient Instructions (Signed)
Follow-up in 1 year for annual exam, sooner as needed. 

## 2018-08-09 ENCOUNTER — Other Ambulatory Visit: Payer: Self-pay | Admitting: Gynecology

## 2018-08-09 DIAGNOSIS — Z1231 Encounter for screening mammogram for malignant neoplasm of breast: Secondary | ICD-10-CM

## 2018-09-06 ENCOUNTER — Ambulatory Visit: Payer: BC Managed Care – PPO

## 2018-10-31 ENCOUNTER — Ambulatory Visit: Payer: BC Managed Care – PPO | Admitting: Gynecology

## 2018-10-31 ENCOUNTER — Encounter: Payer: Self-pay | Admitting: Gynecology

## 2018-10-31 VITALS — BP 124/80 | Ht 65.0 in | Wt 241.0 lb

## 2018-10-31 DIAGNOSIS — Z01419 Encounter for gynecological examination (general) (routine) without abnormal findings: Secondary | ICD-10-CM

## 2018-10-31 DIAGNOSIS — Z79890 Hormone replacement therapy (postmenopausal): Secondary | ICD-10-CM

## 2018-10-31 DIAGNOSIS — N952 Postmenopausal atrophic vaginitis: Secondary | ICD-10-CM | POA: Diagnosis not present

## 2018-10-31 MED ORDER — PROGESTERONE MICRONIZED 100 MG PO CAPS: 100.0000 mg | ORAL_CAPSULE | Freq: Every day | ORAL | 12 refills | Status: DC

## 2018-10-31 MED ORDER — ESTRADIOL 0.1 MG/24HR TD PTTW: MEDICATED_PATCH | TRANSDERMAL | 12 refills | Status: DC

## 2018-10-31 NOTE — Addendum Note (Signed)
Addended by: Nelva Nay on: 10/31/2018 05:01 PM   Modules accepted: Orders

## 2018-10-31 NOTE — Progress Notes (Signed)
    Emily Nunez Feb 14, 1961 [id number]        57 y.o.  G3P3003 for annual gynecologic exam.  Without gynecologic complaints  Past medical history,surgical history, problem list, medications, allergies, family history and social history were all reviewed and documented as reviewed in the EPIC chart.  ROS:  Performed with pertinent positives and negatives included in the history, assessment and plan.   Additional significant findings : None   Exam: [name] assistant Vitals:   10/31/18 1620  BP: 124/80  Weight: 241 lb (109.3 kg)  Height: 5\' 5"  (1.651 m)   Body mass index is 40.1 kg/m.  General appearance:  Normal affect, orientation and appearance. Skin: Grossly normal HEENT: Without gross lesions.  No cervical or supraclavicular adenopathy. Thyroid normal.  Lungs:  Clear without wheezing, rales or rhonchi Cardiac: RR, without RMG Abdominal:  Soft, nontender, without masses, guarding, rebound, organomegaly or hernia Breasts:  Examined lying and sitting without masses, retractions, discharge or axillary adenopathy. Pelvic:  Ext, BUS, Vagina: With atrophic changes  Cervix: With atrophic changes.  Pap smear done  Uterus: Grossly normal size, shape and contour, midline and mobile nontender   Adnexa: Without masses or tenderness    Anus and perineum: Normal   Rectovaginal: Normal sphincter tone without palpated masses or tenderness.    Assessment/Plan:  57 y.o. G73P3003 female for annual gynecologic exam.   1. Post menopausal/atrophic genital changes/HRT.  Continues on estradiol 0.1 mg patch and Prometrium 100 mg nightly.  No bleeding.  We again discussed risks versus benefits to include increased risk of thrombosis such as stroke heart attack DVT in the breast cancer issue.  Benefits as far as symptom relief, cardiovascular and bone health also reviewed.  At this point the patient wants to continue and I refilled her x1 year. 2. Mammography 06/2017.  Patient has scheduled and  will follow-up for this.  Breast exam normal today. 3. Colonoscopy 2013.  Repeat at their recommended interval. 4. Pap smear 07/2016.  Pap smear done today.  No history of abnormal Pap smears previously. 5. DEXA never.  Will plan further into the menopause at age 33. 39. Health maintenance.  No routine lab work done as patient does this elsewhere.  Follow-up 1 year, sooner as needed.   Anastasio Auerbach MD, 4:42 PM 10/31/2018

## 2018-10-31 NOTE — Patient Instructions (Signed)
Follow-up in 1 year for annual exam, sooner if any issues. 

## 2018-11-01 LAB — PAP IG W/ RFLX HPV ASCU

## 2018-11-13 ENCOUNTER — Ambulatory Visit: Payer: BC Managed Care – PPO

## 2018-12-20 ENCOUNTER — Ambulatory Visit
Admission: RE | Admit: 2018-12-20 | Discharge: 2018-12-20 | Disposition: A | Discharge status: Home / Self Care | Payer: BC Managed Care – PPO | Source: Ambulatory Visit | Attending: Gynecology | Admitting: Gynecology

## 2018-12-20 DIAGNOSIS — Z1231 Encounter for screening mammogram for malignant neoplasm of breast: Secondary | ICD-10-CM

## 2018-12-24 ENCOUNTER — Other Ambulatory Visit: Payer: Self-pay | Admitting: Gynecology

## 2018-12-24 DIAGNOSIS — R928 Other abnormal and inconclusive findings on diagnostic imaging of breast: Secondary | ICD-10-CM

## 2018-12-29 HISTORY — PX: BREAST BIOPSY: SHX20

## 2019-01-02 ENCOUNTER — Other Ambulatory Visit: Payer: BC Managed Care – PPO

## 2019-01-03 ENCOUNTER — Ambulatory Visit
Admission: RE | Admit: 2019-01-03 | Discharge: 2019-01-03 | Disposition: A | Discharge status: Home / Self Care | Payer: BC Managed Care – PPO | Source: Ambulatory Visit | Attending: Gynecology | Admitting: Gynecology

## 2019-01-03 ENCOUNTER — Other Ambulatory Visit: Payer: Self-pay | Admitting: Gynecology

## 2019-01-03 DIAGNOSIS — N6489 Other specified disorders of breast: Secondary | ICD-10-CM

## 2019-01-03 DIAGNOSIS — R928 Other abnormal and inconclusive findings on diagnostic imaging of breast: Secondary | ICD-10-CM

## 2019-01-04 ENCOUNTER — Encounter: Payer: Self-pay | Admitting: Gynecology

## 2019-01-04 ENCOUNTER — Ambulatory Visit (INDEPENDENT_AMBULATORY_CARE_PROVIDER_SITE_OTHER): Payer: BC Managed Care – PPO | Admitting: Gynecology

## 2019-01-04 VITALS — BP 124/80

## 2019-01-04 DIAGNOSIS — N950 Postmenopausal bleeding: Secondary | ICD-10-CM | POA: Diagnosis not present

## 2019-01-04 NOTE — Progress Notes (Signed)
    ANDREA FERRER 10-31-1961 [id number]        58 y.o.  G3P3003 presents with a 3-week history of vaginal bleeding.  No significant discomfort.  Had been on estradiol 0.1 mg patch and Prometrium 100 mg nightly.  Stopped this several weeks ago.  Is not having menopausal symptoms.  Past medical history,surgical history, problem list, medications, allergies, family history and social history were all reviewed and documented in the EPIC chart.  Directed ROS with pertinent positives and negatives documented in the history of present illness/assessment and plan.  Exam: Wandra Scot assistant Vitals:   01/04/19 0924  BP: 124/80   General appearance:  Normal Abdomen soft nontender without masses guarding rebound Pelvic external BUS vagina with light menses flow.  Cervix grossly normal.  Uterus grossly normal size midline mobile nontender.  Adnexa without masses or tenderness.  Assessment/Plan:  58 y.o. [id number] with postmenopausal bleeding on HRT.  Has stopped the HRT and is not having significant menopausal symptoms.  Will stay off of the HRT for now.  Will schedule sonohysterogram for endometrial assessment.  We discussed the differential to include atrophic, structural such as polyps/myomas, hyperplastic and uterine cancer.  Patient will follow-up for the sonohysterogram.  She also has a breast biopsy scheduled next week due to an area seen on her recent screening mammogram.    Anastasio Auerbach MD, 9:34 AM 01/04/2019

## 2019-01-04 NOTE — Patient Instructions (Signed)
Follow-up for the ultrasound as scheduled. 

## 2019-01-14 ENCOUNTER — Ambulatory Visit
Admission: RE | Admit: 2019-01-14 | Discharge: 2019-01-14 | Disposition: A | Discharge status: Home / Self Care | Payer: BC Managed Care – PPO | Source: Ambulatory Visit | Attending: Gynecology | Admitting: Gynecology

## 2019-01-14 ENCOUNTER — Other Ambulatory Visit: Payer: BC Managed Care – PPO

## 2019-01-14 DIAGNOSIS — N6489 Other specified disorders of breast: Secondary | ICD-10-CM

## 2019-01-16 ENCOUNTER — Ambulatory Visit: Payer: BC Managed Care – PPO | Admitting: Gynecology

## 2019-01-24 ENCOUNTER — Encounter: Payer: Self-pay | Admitting: General Surgery

## 2019-01-24 ENCOUNTER — Other Ambulatory Visit: Payer: Self-pay | Admitting: General Surgery

## 2019-01-24 ENCOUNTER — Other Ambulatory Visit: Payer: Self-pay | Admitting: Gynecology

## 2019-01-24 ENCOUNTER — Ambulatory Visit (INDEPENDENT_AMBULATORY_CARE_PROVIDER_SITE_OTHER): Payer: BC Managed Care – PPO | Admitting: Gynecology

## 2019-01-24 ENCOUNTER — Ambulatory Visit: Payer: BC Managed Care – PPO

## 2019-01-24 VITALS — BP 124/80

## 2019-01-24 DIAGNOSIS — R928 Other abnormal and inconclusive findings on diagnostic imaging of breast: Secondary | ICD-10-CM

## 2019-01-24 DIAGNOSIS — N840 Polyp of corpus uteri: Secondary | ICD-10-CM

## 2019-01-24 DIAGNOSIS — N950 Postmenopausal bleeding: Secondary | ICD-10-CM | POA: Diagnosis not present

## 2019-01-24 NOTE — Addendum Note (Signed)
Addended by: Nelva Nay on: 01/24/2019 12:34 PM   Modules accepted: Orders

## 2019-01-24 NOTE — Patient Instructions (Signed)
Office will call you with biopsy results and to arrange for surgery

## 2019-01-24 NOTE — Progress Notes (Signed)
    Emily Nunez [birth year] [id number]        57 y.o.  G3P3003 presents for sonohysterogram.  Had been on estradiol 0.1 mg patch and Prometrium 100 mg nightly.  Had 3 weeks of vaginal spotting.  Subsequently stopped her HRT.  History of endometrial ablation with hydrothermal ablation 2005.  Past medical history,surgical history, problem list, medications, allergies, family history and social history were all reviewed and documented in the EPIC chart.  Directed ROS with pertinent positives and negatives documented in the history of present illness/assessment and plan.  Exam: Ivin Booty assistant BP 124/80 General appearance:  Normal Abdomen soft nontender without masses guarding rebound Pelvic external BUS vagina with atrophic changes.  Cervix normal.  Uterus grossly normal midline mobile nontender.  Adnexa without masses or tenderness.  Ultrasound transvaginal shows uterus generous in size with several small myomas 17 x 15 mm, 12 x 12 mm and several others less than 1 cm.  Endometrial echo 3.8 mm.  Both ovaries visualized and normal.  Cul-de-sac negative.  Sonohysterogram performed, sterile technique, easy catheter introduction, good distention with 10 mm intracavitary mass posterior wall consistent with endometrial polyp.  Endometrial sample taken.  Patient tolerated well.  Assessment/Plan:  58 y.o. [id number] with postmenopausal bleeding on HRT.  Has now discontinued HRT.  History of ablation in the past with HTA.  Reviewed results of the sonohysterogram with the patient with findings consistent with endometrial polyp.  We did discuss after ablations endometrial cavities can be somewhat difficult to interpret and whether this is a true polyp versus scarring reviewed.  My recommendation is to proceed with hysteroscopy D&C with further evaluation of the cavity and resection of any defects.  I discussed with involved with the procedure to include the expected intraoperative and postoperative courses.   Risks including infection, hemorrhage necessitating transfusion, damage to internal/surrounding tissues such as vagina cervix and uterus reviewed as well as bowel bladder ureters vessels and nerves necessitating reparative surgeries and possible future reparative surgeries including ostomy formation was discussed.  Patient will follow-up for the the biopsy results from the ultrasound today and to arrange for the hysteroscopy D&C.    Anastasio Auerbach MD, 11:51 AM 01/24/2019

## 2019-01-29 ENCOUNTER — Telehealth: Payer: Self-pay | Admitting: *Deleted

## 2019-01-29 NOTE — Telephone Encounter (Signed)
LM for pt to call me back to discuss scheduling surgery. KW CMA

## 2019-02-14 NOTE — Telephone Encounter (Signed)
Due to COVID 19 and precautions to take, no non urgent cases are being posted until May 2020. I will reach out to patient again in a few weeks to discuss scheduling. KW CMA

## 2019-02-18 NOTE — Telephone Encounter (Signed)
Pt called back today and stated she wants a surgery date in July 2020. I advised I do not have our physicians July schedule yet and I would call when I received it to schedule her surgery.  Pt understood. KW CMA

## 2019-03-06 ENCOUNTER — Encounter: Payer: Self-pay | Admitting: *Deleted

## 2019-03-09 ENCOUNTER — Other Ambulatory Visit: Payer: Self-pay | Admitting: Gynecology

## 2019-04-16 ENCOUNTER — Telehealth: Payer: Self-pay

## 2019-04-16 NOTE — Telephone Encounter (Signed)
Patient called and said she is ready to schedule surgery. She said she could do something end of June or July. I explained I have one day that might open on 6/23 and I am awaiting July schedule. I will call her as soon as I know about 6/23.

## 2019-04-17 ENCOUNTER — Other Ambulatory Visit: Payer: Self-pay | Admitting: General Surgery

## 2019-04-17 DIAGNOSIS — R928 Other abnormal and inconclusive findings on diagnostic imaging of breast: Secondary | ICD-10-CM

## 2019-04-23 ENCOUNTER — Telehealth: Payer: Self-pay

## 2019-04-23 ENCOUNTER — Other Ambulatory Visit: Payer: Self-pay | Admitting: Gynecology

## 2019-04-23 MED ORDER — MISOPROSTOL 100 MCG PO TABS: ORAL_TABLET | ORAL | 0 refills | Status: DC

## 2019-04-23 NOTE — Telephone Encounter (Signed)
Should be fine.

## 2019-04-23 NOTE — Telephone Encounter (Signed)
Patient was advised all ok with breast surgery after D&C, Hyst.  Advised patient I have surgery scheduled 05/21/19. We discussed her ins benefits and her est surgery prepymt. Due by one week prior. Pre op appt was scheduled. I advised her regarding need for Cytotec tab vaginally hs before surgery and that she might see some spotting, bleeding and/or cramping when she uses it but no worries just means it is working. Rx was sent to her pharmacy. I mailed her a pamphlet and a Ambulance person.

## 2019-04-23 NOTE — Telephone Encounter (Signed)
I called patient to let her know what June 23rd is now available if she would like to schedule her surgery on that day.  She is scheduled for a breast biopsy on July 8th and wanted to make sure that was okay.

## 2019-04-24 ENCOUNTER — Encounter: Payer: Self-pay | Admitting: Gynecology

## 2019-05-09 ENCOUNTER — Telehealth: Payer: Self-pay

## 2019-05-09 NOTE — Telephone Encounter (Signed)
Patient called stating she needs to cancel her surgery. She is feeling really bad/sick.  She said they had a painter working in their house who has been diagnosed with Covid. Her husband got sick last week with aches/cough and he was tested and result should be back tomorrow.  She is in contact with PCP to see about being tested.  Meantime I will cancel her surgery and she will call me when she is ready to re-schedule.

## 2019-05-10 ENCOUNTER — Ambulatory Visit: Payer: BC Managed Care – PPO | Admitting: Gynecology

## 2019-05-13 ENCOUNTER — Telehealth: Payer: Self-pay

## 2019-05-13 NOTE — Telephone Encounter (Signed)
Patient called to reschedule surgery for July. She said her COVID test was negative as was her husband's. At her request I scheduled her for 7/28 at 11am at [facility].  She already has the Cytotec Tab and instructions.  She will be rescheduled for pre op appt with TF as she was not able to have that yet.

## 2019-05-21 ENCOUNTER — Ambulatory Visit (HOSPITAL_BASED_OUTPATIENT_CLINIC_OR_DEPARTMENT_OTHER): Admit: 2019-05-21 | Payer: BC Managed Care – PPO | Admitting: Gynecology

## 2019-05-23 ENCOUNTER — Other Ambulatory Visit: Payer: Self-pay | Admitting: General Surgery

## 2019-05-23 DIAGNOSIS — R928 Other abnormal and inconclusive findings on diagnostic imaging of breast: Secondary | ICD-10-CM

## 2019-05-28 ENCOUNTER — Encounter (HOSPITAL_BASED_OUTPATIENT_CLINIC_OR_DEPARTMENT_OTHER): Payer: Self-pay | Admitting: *Deleted

## 2019-05-28 ENCOUNTER — Other Ambulatory Visit: Payer: Self-pay

## 2019-05-29 HISTORY — PX: BREAST EXCISIONAL BIOPSY: SUR124

## 2019-06-02 NOTE — H&P (Signed)
Emily Nunez Location: [facility] Patient #: [id number] DOB: [birth year] Married / Language: English / Race: White Female        History of Present Illness      . 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 Imaging was performed at [facility] by Dr. Melanee Spry.     She has no prior history of breast problems. Gets annual mammograms. This year the mammogram showed category B density. Focal architectural distortion upper outer quadrant right breast. Axillary ultrasound negative. He was guided biopsy shows complex worsening lesion and usual ductal hyperplasia. Small amount of bruising.     Past history is significant only for hypothyroidism. Basal cell cancer. BMI 34 Family history reveals mother died of metastatic colon cancer age 14. Father had heart disease. There is no family history of breast cancer ovarian cancer pancreatic cancer or prostate cancer Social history she is married and has 3 children. She is a second grade school Pharmacist, hospital at [employer]. Denies tobacco. Alcohol rarely      We had a long discussion. I told her the risk of this was somewhere between 5 and 10% upgrade to cancer. Most likely not cancer. She wants to have this excised but wants to wait so that she doesn't miss school. I think that's reasonable since the risk is low. She'll be scheduled for right breast lumpectomy radioactive seed localization.   Past Surgical History No pertinent past surgical history   Diagnostic Studies History  Colonoscopy  5-10 years ago Mammogram  within last year Pap Smear  1-5 years ago  Allergies  No Known Drug Allergies  Allergies Reconciled   Medication History  Zolpidem Tartrate (5MG  Tablet, Oral) Active. Vitamin D (Ergocalciferol) (50000UNIT Capsule, Oral) Active. Levothyroxine Sodium (150MCG Tablet, Oral) Active. Medications  Reconciled  Social History Alcohol use  Occasional alcohol use. Caffeine use  Coffee. No drug use  Tobacco use  Never smoker.  Family History Colon Cancer  Mother. Heart Disease  Father.  Pregnancy / Birth History  Age at menarche  89 years. Age of menopause  6-55 Gravida  3 Length (months) of breastfeeding  7-12 Maternal age  1-30 Para  3  Other Problems Back Pain  Melanoma     Review of Systems  General Not Present- Appetite Loss, Chills, Fatigue, Fever, Night Sweats, Weight Gain and Weight Loss. Skin Not Present- Change in Wart/Mole, Dryness, Hives, Jaundice, New Lesions, Non-Healing Wounds, Rash and Ulcer. HEENT Not Present- Earache, Hearing Loss, Hoarseness, Nose Bleed, Oral Ulcers, Ringing in the Ears, Seasonal Allergies, Sinus Pain, Sore Throat, Visual Disturbances, Wears glasses/contact lenses and Yellow Eyes. Respiratory Not Present- Bloody sputum, Chronic Cough, Difficulty Breathing, Snoring and Wheezing. Breast Not Present- Breast Mass, Breast Pain, Nipple Discharge and Skin Changes. Cardiovascular Not Present- Chest Pain, Difficulty Breathing Lying Down, Leg Cramps, Palpitations, Rapid Heart Rate, Shortness of Breath and Swelling of Extremities. Gastrointestinal Not Present- Abdominal Pain, Bloating, Bloody Stool, Change in Bowel Habits, Chronic diarrhea, Constipation, Difficulty Swallowing, Excessive gas, Gets full quickly at meals, Hemorrhoids, Indigestion, Nausea, Rectal Pain and Vomiting. Female Genitourinary Not Present- Frequency, Nocturia, Painful Urination, Pelvic Pain and Urgency. Musculoskeletal Not Present- Back Pain, Joint Pain, Joint Stiffness, Muscle Pain, Muscle Weakness and Swelling of Extremities. Neurological Not Present- Decreased Memory, Fainting, Headaches, Numbness, Seizures, Tingling, Tremor, Trouble walking and Weakness. Psychiatric Not Present- Anxiety, Bipolar, Change in Sleep Pattern, Depression, Fearful and Frequent  crying. Endocrine Not  Present- Cold Intolerance, Excessive Hunger, Hair Changes, Heat Intolerance, Hot flashes and New Diabetes. Hematology Not Present- Blood Thinners, Easy Bruising, Excessive bleeding, Gland problems, HIV and Persistent Infections.  Vitals  Weight: 234 lb Height: 65in Body Surface Area: 2.11 m Body Mass Index: 38.94 kg/m  Temp.: 97.20F  Pulse: 105 (Regular)  BP: 108/72(Sitting, Left Arm, Standard)     Physical Exam  General Mental Status-Alert. General Appearance-Consistent with stated age. Hydration-Well hydrated. Voice-Normal. Note: Pleasant and cooperative. A little frustrated that she is having to go through this but not inappropriate. BMI 38   Head and Neck Head-normocephalic, atraumatic with no lesions or palpable masses. Trachea-midline. Thyroid Gland Characteristics - normal size and consistency.  Eye Eyeball - Bilateral-Extraocular movements intact. Sclera/Conjunctiva - Bilateral-No scleral icterus.  Chest and Lung Exam Chest and lung exam reveals -quiet, even and easy respiratory effort with no use of accessory muscles and on auscultation, normal breath sounds, no adventitious sounds and normal vocal resonance. Inspection Chest Wall - Normal. Back - normal.  Breast Note: Breasts are large. Small area of ecchymoses upper outer right breast. 1 cm palpable density that may just be a hematoma. No other skin changes or mass on either side. No axillary adenopathy.   Cardiovascular Cardiovascular examination reveals -normal heart sounds, regular rate and rhythm with no murmurs and normal pedal pulses bilaterally.  Abdomen Inspection Inspection of the abdomen reveals - No Hernias. Skin - Scar - no surgical scars. Palpation/Percussion Palpation and Percussion of the abdomen reveal - Soft, Non Tender, No Rebound tenderness, No Rigidity (guarding) and No hepatosplenomegaly. Auscultation Auscultation of the abdomen  reveals - Bowel sounds normal.  Neurologic Neurologic evaluation reveals -alert and oriented x 3 with no impairment of recent or remote memory. Mental Status-Normal.  Musculoskeletal Normal Exam - Left-Upper Extremity Strength Normal and Lower Extremity Strength Normal. Normal Exam - Right-Upper Extremity Strength Normal and Lower Extremity Strength Normal.  Lymphatic Head & Neck  General Head & Neck Lymphatics: Bilateral - Description - Normal. Axillary  General Axillary Region: Bilateral - Description - Normal. Tenderness - Non Tender. Femoral & Inguinal  Generalized Femoral & Inguinal Lymphatics: Bilateral - Description - Normal. Tenderness - Non Tender.    Assessment & Plan  ABNORMALITY OF RIGHT BREAST ON SCREENING MAMMOGRAM (R92.8)    Your recent screening mammograms showed a small area of architectural distortion in the upper outer quadrant of the right breast Ultrasound of the right axilla showed normal-appearing lymph nodes Image guided needle biopsy shows an abnormality call complex sclerosing lesion They did not see any cancer, but the risk associated with this is somewhat between 5 and 8%. Excision of this area is recommended to prove that you do not have cancer Most likely you do not have cancer  You have stated you would like to proceed with right breast lumpectomy with radioactive seed localization at the end of the school year and I think that's reasonably safe due to the risk I have discussed the indications, techniques, and risks of the surgery with you in detail   HYPOTHYROIDISM, ADULT (E03.9)  BMI 38.0-38.9,ADULT (Z68.38)    .hgmis

## 2019-06-03 ENCOUNTER — Other Ambulatory Visit (HOSPITAL_COMMUNITY)
Admission: RE | Admit: 2019-06-03 | Discharge: 2019-06-03 | Disposition: A | Discharge status: Home / Self Care | Payer: BC Managed Care – PPO | Source: Ambulatory Visit | Attending: General Surgery | Admitting: General Surgery

## 2019-06-03 DIAGNOSIS — Z1159 Encounter for screening for other viral diseases: Secondary | ICD-10-CM | POA: Insufficient documentation

## 2019-06-03 DIAGNOSIS — Z01812 Encounter for preprocedural laboratory examination: Secondary | ICD-10-CM | POA: Insufficient documentation

## 2019-06-03 LAB — SARS CORONAVIRUS 2 (TAT 6-24 HRS): SARS Coronavirus 2: NEGATIVE

## 2019-06-03 NOTE — Progress Notes (Signed)
Patient given Ensure pre surgery drink and CHG soap with instruction on use. Pt voice understanding without questions.

## 2019-06-04 ENCOUNTER — Other Ambulatory Visit: Payer: Self-pay

## 2019-06-04 ENCOUNTER — Ambulatory Visit
Admission: RE | Admit: 2019-06-04 | Discharge: 2019-06-04 | Disposition: A | Discharge status: Home / Self Care | Payer: BC Managed Care – PPO | Source: Ambulatory Visit | Attending: General Surgery | Admitting: General Surgery

## 2019-06-04 DIAGNOSIS — R928 Other abnormal and inconclusive findings on diagnostic imaging of breast: Secondary | ICD-10-CM

## 2019-06-05 ENCOUNTER — Ambulatory Visit
Admission: RE | Admit: 2019-06-05 | Discharge: 2019-06-05 | Disposition: A | Discharge status: Home / Self Care | Payer: BC Managed Care – PPO | Source: Ambulatory Visit | Attending: General Surgery | Admitting: General Surgery

## 2019-06-05 ENCOUNTER — Encounter (HOSPITAL_BASED_OUTPATIENT_CLINIC_OR_DEPARTMENT_OTHER)
Admission: RE | Disposition: A | Discharge status: Home / Self Care | Payer: Self-pay | Source: Home / Self Care | Attending: General Surgery

## 2019-06-05 ENCOUNTER — Ambulatory Visit (HOSPITAL_BASED_OUTPATIENT_CLINIC_OR_DEPARTMENT_OTHER)
Admission: RE | Admit: 2019-06-05 | Discharge: 2019-06-05 | Disposition: A | Discharge status: Home / Self Care | Payer: BC Managed Care – PPO | Attending: General Surgery | Admitting: General Surgery

## 2019-06-05 ENCOUNTER — Ambulatory Visit (HOSPITAL_BASED_OUTPATIENT_CLINIC_OR_DEPARTMENT_OTHER): Payer: BC Managed Care – PPO | Admitting: Certified Registered"

## 2019-06-05 ENCOUNTER — Encounter (HOSPITAL_BASED_OUTPATIENT_CLINIC_OR_DEPARTMENT_OTHER): Payer: Self-pay | Admitting: Certified Registered"

## 2019-06-05 ENCOUNTER — Other Ambulatory Visit: Payer: Self-pay

## 2019-06-05 DIAGNOSIS — Z8249 Family history of ischemic heart disease and other diseases of the circulatory system: Secondary | ICD-10-CM | POA: Insufficient documentation

## 2019-06-05 DIAGNOSIS — Z79899 Other long term (current) drug therapy: Secondary | ICD-10-CM | POA: Diagnosis not present

## 2019-06-05 DIAGNOSIS — Z800 Family history of malignant neoplasm of digestive organs: Secondary | ICD-10-CM | POA: Diagnosis not present

## 2019-06-05 DIAGNOSIS — N6489 Other specified disorders of breast: Secondary | ICD-10-CM | POA: Diagnosis present

## 2019-06-05 DIAGNOSIS — N6021 Fibroadenosis of right breast: Secondary | ICD-10-CM | POA: Insufficient documentation

## 2019-06-05 DIAGNOSIS — E039 Hypothyroidism, unspecified: Secondary | ICD-10-CM | POA: Insufficient documentation

## 2019-06-05 DIAGNOSIS — N62 Hypertrophy of breast: Secondary | ICD-10-CM | POA: Diagnosis not present

## 2019-06-05 DIAGNOSIS — R928 Other abnormal and inconclusive findings on diagnostic imaging of breast: Secondary | ICD-10-CM

## 2019-06-05 DIAGNOSIS — Z85820 Personal history of malignant melanoma of skin: Secondary | ICD-10-CM | POA: Diagnosis not present

## 2019-06-05 DIAGNOSIS — M549 Dorsalgia, unspecified: Secondary | ICD-10-CM | POA: Diagnosis not present

## 2019-06-05 HISTORY — PX: BREAST LUMPECTOMY WITH RADIOACTIVE SEED LOCALIZATION: SHX6424

## 2019-06-05 HISTORY — DX: Other abnormal and inconclusive findings on diagnostic imaging of breast: R92.8

## 2019-06-05 SURGERY — BREAST LUMPECTOMY WITH RADIOACTIVE SEED LOCALIZATION
Anesthesia: General | Site: Breast | Laterality: Right

## 2019-06-05 MED ORDER — PROPOFOL 500 MG/50ML IV EMUL
INTRAVENOUS | Status: DC | PRN
  Administered 2019-06-05: 25 ug/kg/min via INTRAVENOUS

## 2019-06-05 MED ORDER — DEXAMETHASONE SODIUM PHOSPHATE 10 MG/ML IJ SOLN
INTRAMUSCULAR | Status: AC
  Filled 2019-06-05: qty 1

## 2019-06-05 MED ORDER — GABAPENTIN 300 MG PO CAPS
300.0000 mg | ORAL_CAPSULE | ORAL | Status: AC
  Administered 2019-06-05: 300 mg via ORAL

## 2019-06-05 MED ORDER — SODIUM CHLORIDE 0.9% FLUSH: 3.0000 mL | Freq: Two times a day (BID) | INTRAVENOUS | Status: DC

## 2019-06-05 MED ORDER — CHLORHEXIDINE GLUCONATE CLOTH 2 % EX PADS: 6.0000 | MEDICATED_PAD | Freq: Once | CUTANEOUS | Status: DC

## 2019-06-05 MED ORDER — FENTANYL CITRATE (PF) 100 MCG/2ML IJ SOLN
25.0000 ug | INTRAMUSCULAR | Status: DC | PRN
  Administered 2019-06-05: 50 ug via INTRAVENOUS
  Administered 2019-06-05: 25 ug via INTRAVENOUS

## 2019-06-05 MED ORDER — PROPOFOL 500 MG/50ML IV EMUL
INTRAVENOUS | Status: AC
  Filled 2019-06-05: qty 50

## 2019-06-05 MED ORDER — CELECOXIB 200 MG PO CAPS
200.0000 mg | ORAL_CAPSULE | ORAL | Status: AC
  Administered 2019-06-05: 200 mg via ORAL

## 2019-06-05 MED ORDER — ACETAMINOPHEN 500 MG PO TABS
ORAL_TABLET | ORAL | Status: AC
  Filled 2019-06-05: qty 2

## 2019-06-05 MED ORDER — EPHEDRINE 5 MG/ML INJ
INTRAVENOUS | Status: AC
  Filled 2019-06-05: qty 30

## 2019-06-05 MED ORDER — ONDANSETRON HCL 4 MG/2ML IJ SOLN
INTRAMUSCULAR | Status: AC
  Filled 2019-06-05: qty 4

## 2019-06-05 MED ORDER — PHENYLEPHRINE 40 MCG/ML (10ML) SYRINGE FOR IV PUSH (FOR BLOOD PRESSURE SUPPORT)
PREFILLED_SYRINGE | INTRAVENOUS | Status: AC
  Filled 2019-06-05: qty 30

## 2019-06-05 MED ORDER — LIDOCAINE 2% (20 MG/ML) 5 ML SYRINGE
INTRAMUSCULAR | Status: AC
  Filled 2019-06-05: qty 5

## 2019-06-05 MED ORDER — METOCLOPRAMIDE HCL 5 MG/ML IJ SOLN: 10.0000 mg | Freq: Once | INTRAMUSCULAR | Status: DC | PRN

## 2019-06-05 MED ORDER — LIDOCAINE HCL (CARDIAC) PF 100 MG/5ML IV SOSY
PREFILLED_SYRINGE | INTRAVENOUS | Status: DC | PRN
  Administered 2019-06-05: 60 mg via INTRAVENOUS

## 2019-06-05 MED ORDER — CELECOXIB 200 MG PO CAPS
ORAL_CAPSULE | ORAL | Status: AC
  Filled 2019-06-05: qty 1

## 2019-06-05 MED ORDER — HYDROCODONE-ACETAMINOPHEN 5-325 MG PO TABS: 1.0000 | ORAL_TABLET | Freq: Four times a day (QID) | ORAL | 0 refills | Status: DC | PRN

## 2019-06-05 MED ORDER — MIDAZOLAM HCL 2 MG/2ML IJ SOLN
1.0000 mg | INTRAMUSCULAR | Status: DC | PRN
  Administered 2019-06-05: 2 mg via INTRAVENOUS

## 2019-06-05 MED ORDER — SCOPOLAMINE 1 MG/3DAYS TD PT72: 1.0000 | MEDICATED_PATCH | Freq: Once | TRANSDERMAL | Status: DC

## 2019-06-05 MED ORDER — LACTATED RINGERS IV SOLN
INTRAVENOUS | Status: DC
  Administered 2019-06-05 (×2): via INTRAVENOUS

## 2019-06-05 MED ORDER — EPHEDRINE 5 MG/ML INJ
INTRAVENOUS | Status: AC
  Filled 2019-06-05: qty 10

## 2019-06-05 MED ORDER — CEFAZOLIN SODIUM-DEXTROSE 2-4 GM/100ML-% IV SOLN
INTRAVENOUS | Status: AC
  Filled 2019-06-05: qty 100

## 2019-06-05 MED ORDER — ONDANSETRON HCL 4 MG/2ML IJ SOLN
INTRAMUSCULAR | Status: DC | PRN
  Administered 2019-06-05: 4 mg via INTRAVENOUS

## 2019-06-05 MED ORDER — PROPOFOL 10 MG/ML IV BOLUS
INTRAVENOUS | Status: DC | PRN
  Administered 2019-06-05: 200 mg via INTRAVENOUS

## 2019-06-05 MED ORDER — DEXAMETHASONE SODIUM PHOSPHATE 4 MG/ML IJ SOLN
INTRAMUSCULAR | Status: DC | PRN
  Administered 2019-06-05: 4 mg via INTRAVENOUS

## 2019-06-05 MED ORDER — MIDAZOLAM HCL 2 MG/2ML IJ SOLN
INTRAMUSCULAR | Status: AC
  Filled 2019-06-05: qty 2

## 2019-06-05 MED ORDER — FENTANYL CITRATE (PF) 100 MCG/2ML IJ SOLN
INTRAMUSCULAR | Status: AC
  Filled 2019-06-05: qty 2

## 2019-06-05 MED ORDER — ACETAMINOPHEN 500 MG PO TABS
1000.0000 mg | ORAL_TABLET | ORAL | Status: AC
  Administered 2019-06-05: 1000 mg via ORAL

## 2019-06-05 MED ORDER — BUPIVACAINE-EPINEPHRINE (PF) 0.5% -1:200000 IJ SOLN
INTRAMUSCULAR | Status: DC | PRN
  Administered 2019-06-05: 10 mL

## 2019-06-05 MED ORDER — FENTANYL CITRATE (PF) 100 MCG/2ML IJ SOLN
50.0000 ug | INTRAMUSCULAR | Status: AC | PRN
  Administered 2019-06-05: 25 ug via INTRAVENOUS
  Administered 2019-06-05: 50 ug via INTRAVENOUS
  Administered 2019-06-05: 25 ug via INTRAVENOUS

## 2019-06-05 MED ORDER — GABAPENTIN 300 MG PO CAPS
ORAL_CAPSULE | ORAL | Status: AC
  Filled 2019-06-05: qty 1

## 2019-06-05 MED ORDER — LACTATED RINGERS IV SOLN: INTRAVENOUS | Status: DC

## 2019-06-05 MED ORDER — MEPERIDINE HCL 25 MG/ML IJ SOLN: 6.2500 mg | INTRAMUSCULAR | Status: DC | PRN

## 2019-06-05 MED ORDER — CEFAZOLIN SODIUM-DEXTROSE 2-4 GM/100ML-% IV SOLN
2.0000 g | INTRAVENOUS | Status: AC
  Administered 2019-06-05: 2 g via INTRAVENOUS

## 2019-06-05 SURGICAL SUPPLY — 58 items
APPLIER CLIP 9.375 MED OPEN (MISCELLANEOUS) ×2
BENZOIN TINCTURE PRP APPL 2/3 (GAUZE/BANDAGES/DRESSINGS) IMPLANT
BINDER BREAST LRG (GAUZE/BANDAGES/DRESSINGS) IMPLANT
BINDER BREAST MEDIUM (GAUZE/BANDAGES/DRESSINGS) IMPLANT
BINDER BREAST XLRG (GAUZE/BANDAGES/DRESSINGS) IMPLANT
BINDER BREAST XXLRG (GAUZE/BANDAGES/DRESSINGS) ×1 IMPLANT
BLADE HEX COATED 2.75 (ELECTRODE) ×2 IMPLANT
BLADE SURG 10 STRL SS (BLADE) IMPLANT
BLADE SURG 15 STRL LF DISP TIS (BLADE) ×1 IMPLANT
BLADE SURG 15 STRL SS (BLADE) ×2
CANISTER SUC SOCK COL 7IN (MISCELLANEOUS) IMPLANT
CANISTER SUCT 1200ML W/VALVE (MISCELLANEOUS) ×2 IMPLANT
CHLORAPREP W/TINT 26 (MISCELLANEOUS) ×2 IMPLANT
CLIP APPLIE 9.375 MED OPEN (MISCELLANEOUS) IMPLANT
COVER BACK TABLE REUSABLE LG (DRAPES) ×2 IMPLANT
COVER MAYO STAND REUSABLE (DRAPES) ×2 IMPLANT
COVER PROBE W GEL 5X96 (DRAPES) ×2 IMPLANT
COVER WAND RF STERILE (DRAPES) IMPLANT
DECANTER SPIKE VIAL GLASS SM (MISCELLANEOUS) ×1 IMPLANT
DERMABOND ADVANCED (GAUZE/BANDAGES/DRESSINGS) ×1
DERMABOND ADVANCED .7 DNX12 (GAUZE/BANDAGES/DRESSINGS) ×1 IMPLANT
DRAPE LAPAROSCOPIC ABDOMINAL (DRAPES) ×2 IMPLANT
DRAPE UTILITY XL STRL (DRAPES) ×2 IMPLANT
DRSG PAD ABDOMINAL 8X10 ST (GAUZE/BANDAGES/DRESSINGS) ×2 IMPLANT
ELECT REM PT RETURN 9FT ADLT (ELECTROSURGICAL) ×2
ELECTRODE REM PT RTRN 9FT ADLT (ELECTROSURGICAL) ×1 IMPLANT
GAUZE SPONGE 4X4 12PLY STRL LF (GAUZE/BANDAGES/DRESSINGS) ×2 IMPLANT
GLOVE EUDERMIC 7 POWDERFREE (GLOVE) ×3 IMPLANT
GOWN STRL REUS W/ TWL LRG LVL3 (GOWN DISPOSABLE) ×1 IMPLANT
GOWN STRL REUS W/ TWL XL LVL3 (GOWN DISPOSABLE) ×1 IMPLANT
GOWN STRL REUS W/TWL LRG LVL3 (GOWN DISPOSABLE) ×2
GOWN STRL REUS W/TWL XL LVL3 (GOWN DISPOSABLE) ×2
ILLUMINATOR WAVEGUIDE N/F (MISCELLANEOUS) IMPLANT
KIT MARKER MARGIN INK (KITS) ×2 IMPLANT
LIGHT WAVEGUIDE WIDE FLAT (MISCELLANEOUS) IMPLANT
NDL HYPO 25X1 1.5 SAFETY (NEEDLE) ×1 IMPLANT
NEEDLE HYPO 25X1 1.5 SAFETY (NEEDLE) ×2 IMPLANT
NS IRRIG 1000ML POUR BTL (IV SOLUTION) ×2 IMPLANT
PACK BASIN DAY SURGERY FS (CUSTOM PROCEDURE TRAY) ×2 IMPLANT
PENCIL BUTTON HOLSTER BLD 10FT (ELECTRODE) ×2 IMPLANT
SHEET MEDIUM DRAPE 40X70 STRL (DRAPES) IMPLANT
SLEEVE SCD COMPRESS KNEE MED (MISCELLANEOUS) ×2 IMPLANT
SPONGE LAP 18X18 RF (DISPOSABLE) IMPLANT
SPONGE LAP 4X18 RFD (DISPOSABLE) ×2 IMPLANT
STRIP CLOSURE SKIN 1/2X4 (GAUZE/BANDAGES/DRESSINGS) IMPLANT
SUT ETHILON 3 0 FSL (SUTURE) IMPLANT
SUT MNCRL AB 4-0 PS2 18 (SUTURE) ×2 IMPLANT
SUT SILK 2 0 SH (SUTURE) ×2 IMPLANT
SUT VIC AB 2-0 CT1 27 (SUTURE)
SUT VIC AB 2-0 CT1 TAPERPNT 27 (SUTURE) IMPLANT
SUT VIC AB 3-0 SH 27 (SUTURE)
SUT VIC AB 3-0 SH 27X BRD (SUTURE) IMPLANT
SUT VICRYL 3-0 CR8 SH (SUTURE) ×2 IMPLANT
SYR 10ML LL (SYRINGE) ×1 IMPLANT
TOWEL GREEN STERILE FF (TOWEL DISPOSABLE) ×2 IMPLANT
TRAY FAXITRON CT DISP (TRAY / TRAY PROCEDURE) ×2 IMPLANT
TUBE CONNECTING 20X1/4 (TUBING) ×2 IMPLANT
YANKAUER SUCT BULB TIP NO VENT (SUCTIONS) ×2 IMPLANT

## 2019-06-05 NOTE — Anesthesia Procedure Notes (Signed)
Procedure Name: LMA Insertion Date/Time: 06/05/2019 11:14 AM Performed by: Signe Colt, CRNA Pre-anesthesia Checklist: Patient identified, Emergency Drugs available, Suction available and Patient being monitored Patient Re-evaluated:Patient Re-evaluated prior to induction Oxygen Delivery Method: Circle system utilized Preoxygenation: Pre-oxygenation with 100% oxygen Induction Type: IV induction Ventilation: Mask ventilation without difficulty LMA: LMA inserted LMA Size: 4.0 Number of attempts: 1 Airway Equipment and Method: Bite block Placement Confirmation: positive ETCO2 Tube secured with: Tape Dental Injury: Teeth and Oropharynx as per pre-operative assessment

## 2019-06-05 NOTE — Anesthesia Postprocedure Evaluation (Signed)
Anesthesia Post Note  Patient: Emily Nunez  Procedure(s) Performed: RIGHT BREAST LUMPECTOMY WITH RADIOACTIVE SEED LOCALIZATION (Right Breast)     Patient location during evaluation: PACU Anesthesia Type: General Level of consciousness: awake and alert Pain management: pain level controlled Vital Signs Assessment: post-procedure vital signs reviewed and stable Respiratory status: spontaneous breathing, nonlabored ventilation, respiratory function stable and patient connected to nasal cannula oxygen Cardiovascular status: blood pressure returned to baseline and stable Postop Assessment: no apparent nausea or vomiting Anesthetic complications: no    Last Vitals:  Vitals:   06/05/19 1300 06/05/19 1345  BP: 101/62 99/63  Pulse: 74 74  Resp: 16 16  Temp:  36.6 C  SpO2: 95% 96%    Last Pain:  Vitals:   06/05/19 1345  TempSrc:   PainSc: 2                  Montez Hageman

## 2019-06-05 NOTE — Op Note (Signed)
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 Imaging was performed at [facility] by Dr. Melanee Spry.     She has no prior history of breast problems. Gets annual mammograms. This year the mammogram showed category B density. Focal architectural distortion upper outer quadrant right breast. Axillary ultrasound negative. Image guided biopsy shows complex sclerosing lesion and usual ductal hyperplasia. Small amount of bruising.  There is no family history of breast cancer ovarian cancer pancreatic cancer or prostate cancer      We had a long discussion. She'll be scheduled for right breast lumpectomy radioactive seed localization.  Operative Findings:       The original titanium biopsy clip and the radioactive seed were immediately adjacent to each other.  They were in the upper outer quadrant of the right breast, deep, lateral, posterior third.  Specimen mammogram looked good and contained the seed and the marker clip in the center of the specimen.  Procedure in Detail:          Following the induction of general LMA anesthesia the patient's right breast was prepped and draped in a sterile fashion.  Surgical timeout was performed.  Intravenous antibiotics were given.  0.25% Marcaine with epinephrine was used as a local infiltration anesthetic.     Using the neoprobe identified the area of radioactivity.  I planned a curvilinear incision in the lateral right breast in the skin lines.  The incision was  made with a knife.  The lumpectomy was performed with electrocautery and the neoprobe.  The specimen was removed and marked with silk sutures and a 6 color ink kit to orient the pathologist.  The specimen mammogram looked good as described above.  The specimen was sent to the lab where the seed was retrieved.  Hemostasis was excellent.  The wound was irrigated.  I placed 5 metal marker clips in the walls of the lumpectomy cavity.  The breast tissues were reapproximated in layers with interrupted sutures of 3-0 Vicryl and the skin closed with a running subcuticular 4-0 Monocryl and Dermabond.  Breast binder was placed.  The patient tolerated the procedure well and was taken to PACU in stable condition.  EBL 15 cc.  Counts correct.  Complications none.    Addendum: I logged onto the PMP aware website and reviewed her prescription medication history.     Edsel Petrin. Dalbert Batman, M.D., FACS General and Minimally Invasive Surgery Breast and Colorectal Surgery  06/05/2019 12:12 PM

## 2019-06-05 NOTE — Interval H&P Note (Signed)
History and Physical Interval Note:  06/05/2019 10:54 AM  Emily Nunez  has presented today for surgery, with the diagnosis of RIGHT BREAST COMPLEX SCLEROSING LESION.  The various methods of treatment have been discussed with the patient and family. After consideration of risks, benefits and other options for treatment, the patient has consented to  Procedure(s): RIGHT BREAST LUMPECTOMY WITH RADIOACTIVE SEED LOCALIZATION (Right) as a surgical intervention.  The patient's history has been reviewed, patient examined, no change in status, stable for surgery.  I have reviewed the patient's chart and labs.  Questions were answered to the patient's satisfaction.     Adin Hector

## 2019-06-05 NOTE — Discharge Instructions (Signed)
NO TYLENOL BEFORE 4pm TODAY !   [place] Office Phone Number [phone]  BREAST BIOPSY/ PARTIAL MASTECTOMY: POST OP INSTRUCTIONS  Always review your discharge instruction sheet given to you by the facility where your surgery was performed.  IF YOU HAVE DISABILITY OR FAMILY LEAVE FORMS, YOU MUST BRING THEM TO THE OFFICE FOR PROCESSING.  DO NOT GIVE THEM TO YOUR DOCTOR.  1. A prescription for pain medication may be given to you upon discharge.  Take your pain medication as prescribed, if needed.  If narcotic pain medicine is not needed, then you may take acetaminophen (Tylenol) or ibuprofen (Advil) as needed. 2. Take your usually prescribed medications unless otherwise directed 3. If you need a refill on your pain medication, please contact your pharmacy.  They will contact our office to request authorization.  Prescriptions will not be filled after 5pm or on week-ends. 4. You should eat very light the first 24 hours after surgery, such as soup, crackers, pudding, etc.  Resume your normal diet the day after surgery. 5. Most patients will experience some swelling and bruising in the breast.  Ice packs and a good support bra will help.  Swelling and bruising can take several days to resolve.  6. It is common to experience some constipation if taking pain medication after surgery.  Increasing fluid intake and taking a stool softener will usually help or prevent this problem from occurring.  A mild laxative (Milk of Magnesia or Miralax) should be taken according to package directions if there are no bowel movements after 48 hours. 7. Unless discharge instructions indicate otherwise, you may remove your bandages 24-48 hours after surgery, and you may shower at that time.  You may have steri-strips (small skin tapes) in place directly over the incision.  These strips should be left on the skin for 7-10 days.  If your surgeon used skin glue on the incision, you may shower in 24  hours.  The glue will flake off over the next 2-3 weeks.  Any sutures or staples will be removed at the office during your follow-up visit. 8. ACTIVITIES:  You may resume regular daily activities (gradually increasing) beginning the next day.  Wearing a good support bra or sports bra minimizes pain and swelling.  You may have sexual intercourse when it is comfortable. a. You may drive when you no longer are taking prescription pain medication, you can comfortably wear a seatbelt, and you can safely maneuver your car and apply brakes. b. RETURN TO WORK:  ______________________________________________________________________________________ 9. You should see your doctor in the office for a follow-up appointment approximately two weeks after your surgery.  Your doctors nurse will typically make your follow-up appointment when she calls you with your pathology report.  Expect your pathology report 2-3 business days after your surgery.  You may call to check if you do not hear from [place] after three days. 10. OTHER INSTRUCTIONS: _______________________________________________________________________________________________ _____________________________________________________________________________________________________________________________________ _____________________________________________________________________________________________________________________________________ _____________________________________________________________________________________________________________________________________  WHEN TO CALL YOUR DOCTOR: 1. Fever over 101.0 2. Nausea and/or vomiting. 3. Extreme swelling or bruising. 4. Continued bleeding from incision. 5. Increased pain, redness, or drainage from the incision.  The clinic staff is available to answer your questions during regular business hours.  Please dont hesitate to call and ask to speak to one of the nurses for clinical concerns.  If you have a  medical emergency, go to the nearest emergency room or call 911.  A surgeon from [facility] is always on call at the  hospital.  For further questions, please visit [email or web address]              Managing Your Pain After Surgery Without Opioids    Thank you for participating in our program to help patients manage their pain after surgery without opioids. This is part of our effort to provide you with the best care possible, without exposing you or your family to the risk that opioids pose.  What pain can I expect after surgery? You can expect to have some pain after surgery. This is normal. The pain is typically worse the day after surgery, and quickly begins to get better. Many studies have found that many patients are able to manage their pain after surgery with Over-the-Counter (OTC) medications such as Tylenol and Motrin. If you have a condition that does not allow you to take Tylenol or Motrin, notify your surgical team.  How will I manage my pain? The best strategy for controlling your pain after surgery is around the clock pain control with Tylenol (acetaminophen) and Motrin (ibuprofen or Advil). Alternating these medications with each other allows you to maximize your pain control. In addition to Tylenol and Motrin, you can use heating pads or ice packs on your incisions to help reduce your pain.  How will I alternate your regular strength over-the-counter pain medication? You will take a dose of pain medication every three hours. ; Start by taking 650 mg of Tylenol (2 pills of 325 mg) ; 3 hours later take 600 mg of Motrin (3 pills of 200 mg) ; 3 hours after taking the Motrin take 650 mg of Tylenol ; 3 hours after that take 600 mg of Motrin.   - 1 -  See example - if your first dose of Tylenol is at 12:00 PM   12:00 PM Tylenol 650 mg (2 pills of 325 mg)  3:00 PM Motrin 600 mg (3 pills of 200 mg)  6:00 PM Tylenol 650 mg (2 pills of 325  mg)  9:00 PM Motrin 600 mg (3 pills of 200 mg)  Continue alternating every 3 hours   We recommend that you follow this schedule around-the-clock for at least 3 days after surgery, or until you feel that it is no longer needed. Use the table on the last page of this handout to keep track of the medications you are taking. Important: Do not take more than 3000mg  of Tylenol or 3200mg  of Motrin in a 24-hour period. Do not take ibuprofen/Motrin if you have a history of bleeding stomach ulcers, severe kidney disease, &/or actively taking a blood thinner  What if I still have pain? If you have pain that is not controlled with the over-the-counter pain medications (Tylenol and Motrin or Advil) you might have what we call breakthrough pain. You will receive a prescription for a small amount of an opioid pain medication such as Oxycodone, Tramadol, or Tylenol with Codeine. Use these opioid pills in the first 24 hours after surgery if you have breakthrough pain. Do not take more than 1 pill every 4-6 hours.  If you still have uncontrolled pain after using all opioid pills, don't hesitate to call our staff using the number provided. We will help make sure you are managing your pain in the best way possible, and if necessary, we can provide a prescription for additional pain medication.   Day 1    Time  Name of Medication Number of pills taken  Amount of Acetaminophen  Pain Level   Comments  AM PM       AM PM       AM PM       AM PM       AM PM       AM PM       AM PM       AM PM       Total Daily amount of Acetaminophen Do not take more than  3,000 mg per day      Day 2    Time  Name of Medication Number of pills taken  Amount of Acetaminophen  Pain Level   Comments  AM PM       AM PM       AM PM       AM PM       AM PM       AM PM       AM PM       AM PM       Total Daily amount of Acetaminophen Do not take more than  3,000 mg per day      Day 3    Time  Name of  Medication Number of pills taken  Amount of Acetaminophen  Pain Level   Comments  AM PM       AM PM       AM PM       AM PM          AM PM       AM PM       AM PM       AM PM       Total Daily amount of Acetaminophen Do not take more than  3,000 mg per day      Day 4    Time  Name of Medication Number of pills taken  Amount of Acetaminophen  Pain Level   Comments  AM PM       AM PM       AM PM       AM PM       AM PM       AM PM       AM PM       AM PM       Total Daily amount of Acetaminophen Do not take more than  3,000 mg per day      Day 5    Time  Name of Medication Number of pills taken  Amount of Acetaminophen  Pain Level   Comments  AM PM       AM PM       AM PM       AM PM       AM PM       AM PM       AM PM       AM PM       Total Daily amount of Acetaminophen Do not take more than  3,000 mg per day       Day 6    Time  Name of Medication Number of pills taken  Amount of Acetaminophen  Pain Level  Comments  AM PM       AM PM       AM PM       AM PM       AM PM       AM PM       AM PM  AM PM       Total Daily amount of Acetaminophen Do not take more than  3,000 mg per day      Day 7    Time  Name of Medication Number of pills taken  Amount of Acetaminophen  Pain Level   Comments  AM PM       AM PM       AM PM       AM PM       AM PM       AM PM       AM PM       AM PM       Total Daily amount of Acetaminophen Do not take more than  3,000 mg per day        For additional information about how and where to safely dispose of unused opioid medications - [employer]  Disclaimer: This document contains information and/or instructional materials adapted from [place] for the typical patient with your condition. It does not replace medical advice from your health care provider because your experience may differ from that of the typical patient. Talk to your health care  provider if you have any questions about this document, your condition or your treatment plan. Adapted from [place] Instructions  Activity: Get plenty of rest for the remainder of the day. A responsible individual must stay with you for 24 hours following the procedure.  For the next 24 hours, DO NOT: -Drive a car -Paediatric nurse -Drink alcoholic beverages -Take any medication unless instructed by your physician -Make any legal decisions or sign important papers.  Meals: Start with liquid foods such as gelatin or soup. Progress to regular foods as tolerated. Avoid greasy, spicy, heavy foods. If nausea and/or vomiting occur, drink only clear liquids until the nausea and/or vomiting subsides. Call your physician if vomiting continues.  Special Instructions/Symptoms: Your throat may feel dry or sore from the anesthesia or the breathing tube placed in your throat during surgery. If this causes discomfort, gargle with warm salt water. The discomfort should disappear within 24 hours.  If you had a scopolamine patch placed behind your ear for the management of post- operative nausea and/or vomiting:  1. The medication in the patch is effective for 72 hours, after which it should be removed.  Wrap patch in a tissue and discard in the trash. Wash hands thoroughly with soap and water. 2. You may remove the patch earlier than 72 hours if you experience unpleasant side effects which may include dry mouth, dizziness or visual disturbances. 3. Avoid touching the patch. Wash your hands with soap and water after contact with the patch.

## 2019-06-05 NOTE — Transfer of Care (Signed)
Immediate Anesthesia Transfer of Care Note  Patient: JACKLIN ZWICK  Procedure(s) Performed: RIGHT BREAST LUMPECTOMY WITH RADIOACTIVE SEED LOCALIZATION (Right Breast)  Patient Location: PACU  Anesthesia Type:General  Level of Consciousness: awake, alert , oriented and patient cooperative  Airway & Oxygen Therapy: Patient Spontanous Breathing and Patient connected to nasal cannula oxygen  Post-op Assessment: Report given to RN and Post -op Vital signs reviewed and stable  Post vital signs: Reviewed and stable  Last Vitals:  Vitals Value Taken Time  BP    Temp    Pulse 94 06/05/19 1218  Resp 15 06/05/19 1218  SpO2 99 % 06/05/19 1218  Vitals shown include unvalidated device data.  Last Pain:  Vitals:   06/05/19 0955  TempSrc: Oral  PainSc: 0-No pain      Patients Stated Pain Goal: 0 (06/77/03 4035)  Complications: No apparent anesthesia complications

## 2019-06-05 NOTE — Anesthesia Preprocedure Evaluation (Signed)
Anesthesia Evaluation  Patient identified by MRN, date of birth, ID band Patient awake    Reviewed: Allergy & Precautions, NPO status , Patient's Chart, lab work & pertinent test results  Airway Mallampati: II  TM Distance: >3 FB Neck ROM: Full    Dental no notable dental hx.    Pulmonary neg pulmonary ROS,    Pulmonary exam normal breath sounds clear to auscultation       Cardiovascular negative cardio ROS Normal cardiovascular exam Rhythm:Regular Rate:Normal     Neuro/Psych negative neurological ROS  negative psych ROS   GI/Hepatic negative GI ROS, Neg liver ROS,   Endo/Other  Hypothyroidism   Renal/GU negative Renal ROS  negative genitourinary   Musculoskeletal negative musculoskeletal ROS (+)   Abdominal   Peds negative pediatric ROS (+)  Hematology negative hematology ROS (+)   Anesthesia Other Findings   Reproductive/Obstetrics negative OB ROS                             Anesthesia Physical Anesthesia Plan  ASA: II  Anesthesia Plan: General   Post-op Pain Management:    Induction: Intravenous  PONV Risk Score and Plan: 3 and Ondansetron, Dexamethasone, Treatment may vary due to age or medical condition and Midazolam  Airway Management Planned: LMA  Additional Equipment:   Intra-op Plan:   Post-operative Plan: Extubation in OR  Informed Consent: I have reviewed the patients History and Physical, chart, labs and discussed the procedure including the risks, benefits and alternatives for the proposed anesthesia with the patient or authorized representative who has indicated his/her understanding and acceptance.     Dental advisory given  Plan Discussed with: CRNA  Anesthesia Plan Comments:        Anesthesia Quick Evaluation

## 2019-06-06 ENCOUNTER — Encounter (HOSPITAL_BASED_OUTPATIENT_CLINIC_OR_DEPARTMENT_OTHER): Payer: Self-pay | Admitting: General Surgery

## 2019-06-07 NOTE — Progress Notes (Signed)
Inform patient of Pathology report,. 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 There is no evidence of malignancy or premalignant changes Nothing further needs to be done, and this is obviously excellent news Let me know that you were able to contact her.  She can have a postop visit with Puja if she desires  Thanks, Dalbert Batman

## 2019-06-14 ENCOUNTER — Other Ambulatory Visit: Payer: Self-pay

## 2019-06-17 ENCOUNTER — Other Ambulatory Visit: Payer: Self-pay

## 2019-06-17 ENCOUNTER — Ambulatory Visit (INDEPENDENT_AMBULATORY_CARE_PROVIDER_SITE_OTHER): Payer: BC Managed Care – PPO | Admitting: Gynecology

## 2019-06-17 ENCOUNTER — Encounter: Payer: Self-pay | Admitting: Gynecology

## 2019-06-17 VITALS — BP 124/80

## 2019-06-17 DIAGNOSIS — N950 Postmenopausal bleeding: Secondary | ICD-10-CM | POA: Diagnosis not present

## 2019-06-17 DIAGNOSIS — N840 Polyp of corpus uteri: Secondary | ICD-10-CM

## 2019-06-17 NOTE — H&P (Signed)
Emily Nunez September 20, 1961 [id number]   History and Physical  Chief complaint: Postmenopausal bleeding, endometrial polyp  History of present illness: 58 y.o. G3P3003 with history of postmenopausal bleeding on HRT.  Sonohysterogram showed a 10 mm intracavitary mass consistent with endometrial polyp.  Endometrial biopsy showed endometrial polyp.  Ultrasound showed several small myomas with the endometrial echo 3.8 mm.  Both right and left ovaries visualized and normal.  The patient is planned for hysteroscopy with resection of any residual polyp.  Past Medical History:  Diagnosis Date  . Abnormality of right breast on screening mammogram 06/05/2019  . Cancer (HCC)    BASAL CELL /NOSE  . Cancer ([place]) 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  . Hypothyroid   . Urinary incontinence   . Vitamin D deficiency 09/2010   VIT D = 22    Past Surgical History:  Procedure Laterality Date  . BASAL CELL CARCINOMA EXCISION    . BREAST LUMPECTOMY WITH RADIOACTIVE SEED LOCALIZATION Right 06/05/2019   Procedure: RIGHT BREAST LUMPECTOMY WITH RADIOACTIVE SEED LOCALIZATION;  Surgeon: Fanny Skates, MD;  Location: [facility];  Service: General;  Laterality: Right;  . COLONOSCOPY    . ENDOMETRIAL ABLATION  2005   HYDROTHERMAL ABLATION  . PELVIC LAPAROSCOPY  04/2004   ENDO/CYST    Family History  Problem Relation Age of Onset  . Cancer Mother        COLON  . Heart disease Father     Social History:  reports that she has never smoked. She has never used smokeless tobacco. She reports current alcohol use. She reports that she does not use drugs.  Allergies:  Allergies  Allergen Reactions  . Ciprofloxacin Nausea Only  . Macrobid [Nitrofurantoin Monohydrate Macrocrystals] Nausea Only  . Septra [Bactrim] Nausea Only    Medications: See Epic for the most current list of medications.  ROS:  Was performed and pertinent positives and negatives are included in the history of present illness.  Exam:    [name] assistant Vitals:   06/17/19 1537  BP: 124/80   General appearance:  Normal HEENT normal Lungs clear Cardiac regular rate no rubs murmurs or gallops Abdomen soft nontender without masses guarding rebound Pelvic external BUS vagina normal.  Cervix normal.  Uterus difficult to palpate but no gross masses or tenderness.  Adnexa without masses or tenderness.    Assessment/Plan:  58 y.o. [id number] with history of postmenopausal bleeding where ultrasound and endometrial biopsy are consistent with endometrial polyp for hysteroscopy D&C with resection of any residual polyp.  I reviewed the proposed surgery with the patient to include the expected intraoperative and postoperative courses as well as the recovery period. The use of the hysteroscope, resectoscope and the D&C portion were all discussed. The risks of surgery to include infection, prolonged antibiotics, hemorrhage necessitating transfusion and the risks of transfusion, including transfusion reaction, hepatitis, HIV, mad cow disease and other unknown entities were all discussed understood and accepted. The risk of damage to internal organs during the procedure, either immediately recognized or delay recognized, including vagina, cervix, uterus, possible perforation causing damage to bowel, bladder, ureters, vessels and nerves necessitating major exploratory reparative surgery and future reparative surgeries including bladder repair, ureteral damage repair, bowel resection, ostomy formation was also discussed understood and accepted. The patient's questions were answered to her satisfaction and she is ready to proceed with surgery.    Anastasio Auerbach MD, 3:59 PM 06/17/2019

## 2019-06-17 NOTE — Patient Instructions (Signed)
Followup for surgery as scheduled. 

## 2019-06-17 NOTE — Progress Notes (Signed)
    TA FAIR [birth year] [id number]        Emily y.o.  G3P3003 presents for her preoperative appointment for upcoming hysteroscopy D&C.  History of postmenopausal bleeding on HRT.  Sonohysterogram showed a 10 mm intracavitary mass consistent with endometrial polyp.  Endometrial biopsy showed endometrial polyp.  Ultrasound showed several small myomas within the endometrial echo with 3.8 mm.  Both right and left ovaries visualized and normal.  The patient is planned for hysteroscopy with resection of any residual polyp.  Past medical history,surgical history, problem list, medications, allergies, family history and social history were all reviewed and documented in the EPIC chart.  Directed ROS with pertinent positives and negatives documented in the history of present illness/assessment and plan.  Exam: Emily Nunez assistant Vitals:   06/17/19 1537  BP: 124/80   General appearance:  Normal HEENT normal Lungs clear Cardiac regular rate no rubs murmurs or gallops Abdomen soft nontender without masses guarding rebound Pelvic external BUS vagina normal.  Cervix normal.  Uterus difficult to palpate but no gross masses or tenderness.  Adnexa without masses or tenderness.  Assessment/Plan:  Emily y.o. [id number] with history of postmenopausal bleeding where ultrasound and endometrial biopsy are consistent with endometrial polyp for hysteroscopy D&C with resection of any residual polyp.  I reviewed the proposed surgery with the patient to include the expected intraoperative and postoperative courses as well as the recovery period. The use of the hysteroscope, resectoscope and the D&C portion were all discussed. The risks of surgery to include infection, prolonged antibiotics, hemorrhage necessitating transfusion and the risks of transfusion, including transfusion reaction, hepatitis, HIV, mad cow disease and other unknown entities were all discussed understood and accepted. The risk of damage to internal  organs during the procedure, either immediately recognized or delay recognized, including vagina, cervix, uterus, possible perforation causing damage to bowel, bladder, ureters, vessels and nerves necessitating major exploratory reparative surgery and future reparative surgeries including bladder repair, ureteral damage repair, bowel resection, ostomy formation was also discussed understood and accepted. The patient's questions were answered to her satisfaction and she is ready to proceed with surgery.   Emily Auerbach MD, 3:55 PM 06/17/2019

## 2019-06-21 ENCOUNTER — Other Ambulatory Visit (HOSPITAL_COMMUNITY)
Admission: RE | Admit: 2019-06-21 | Discharge: 2019-06-21 | Disposition: A | Discharge status: Home / Self Care | Payer: BC Managed Care – PPO | Source: Ambulatory Visit | Attending: Gynecology | Admitting: Gynecology

## 2019-06-21 DIAGNOSIS — Z1159 Encounter for screening for other viral diseases: Secondary | ICD-10-CM | POA: Diagnosis not present

## 2019-06-22 LAB — SARS CORONAVIRUS 2 (TAT 6-24 HRS): SARS Coronavirus 2: NEGATIVE

## 2019-06-24 ENCOUNTER — Encounter (HOSPITAL_BASED_OUTPATIENT_CLINIC_OR_DEPARTMENT_OTHER): Payer: Self-pay | Admitting: *Deleted

## 2019-06-24 ENCOUNTER — Telehealth: Payer: Self-pay

## 2019-06-24 ENCOUNTER — Other Ambulatory Visit: Payer: Self-pay

## 2019-06-24 NOTE — Progress Notes (Signed)
Spoke with patient via telephone for pre op interview. NPO after MN. Patient to take synthroid with a sip of water AM of surgery. Arrival time 0800.

## 2019-06-24 NOTE — Telephone Encounter (Signed)
D&C Hysteroscopy with Myosure resection of polyp scheduled in the morning. Nurse said there are no orders in for the case.  Please place your orders ASAP.  THanks!

## 2019-06-25 ENCOUNTER — Ambulatory Visit (HOSPITAL_BASED_OUTPATIENT_CLINIC_OR_DEPARTMENT_OTHER): Payer: BC Managed Care – PPO | Admitting: Anesthesiology

## 2019-06-25 ENCOUNTER — Encounter (HOSPITAL_BASED_OUTPATIENT_CLINIC_OR_DEPARTMENT_OTHER): Payer: Self-pay

## 2019-06-25 ENCOUNTER — Encounter (HOSPITAL_BASED_OUTPATIENT_CLINIC_OR_DEPARTMENT_OTHER)
Admission: RE | Disposition: A | Discharge status: Home / Self Care | Payer: Self-pay | Source: Home / Self Care | Attending: Gynecology

## 2019-06-25 ENCOUNTER — Ambulatory Visit (HOSPITAL_BASED_OUTPATIENT_CLINIC_OR_DEPARTMENT_OTHER)
Admission: RE | Admit: 2019-06-25 | Discharge: 2019-06-25 | Disposition: A | Discharge status: Home / Self Care | Payer: BC Managed Care – PPO | Attending: Gynecology | Admitting: Gynecology

## 2019-06-25 DIAGNOSIS — Z85828 Personal history of other malignant neoplasm of skin: Secondary | ICD-10-CM | POA: Diagnosis not present

## 2019-06-25 DIAGNOSIS — E039 Hypothyroidism, unspecified: Secondary | ICD-10-CM | POA: Insufficient documentation

## 2019-06-25 DIAGNOSIS — Z6838 Body mass index (BMI) 38.0-38.9, adult: Secondary | ICD-10-CM | POA: Diagnosis not present

## 2019-06-25 DIAGNOSIS — Z881 Allergy status to other antibiotic agents status: Secondary | ICD-10-CM | POA: Diagnosis not present

## 2019-06-25 DIAGNOSIS — N950 Postmenopausal bleeding: Secondary | ICD-10-CM | POA: Insufficient documentation

## 2019-06-25 DIAGNOSIS — N840 Polyp of corpus uteri: Secondary | ICD-10-CM | POA: Diagnosis not present

## 2019-06-25 DIAGNOSIS — Z800 Family history of malignant neoplasm of digestive organs: Secondary | ICD-10-CM | POA: Insufficient documentation

## 2019-06-25 DIAGNOSIS — Z882 Allergy status to sulfonamides status: Secondary | ICD-10-CM | POA: Insufficient documentation

## 2019-06-25 DIAGNOSIS — Z8249 Family history of ischemic heart disease and other diseases of the circulatory system: Secondary | ICD-10-CM | POA: Diagnosis not present

## 2019-06-25 DIAGNOSIS — E669 Obesity, unspecified: Secondary | ICD-10-CM | POA: Diagnosis not present

## 2019-06-25 HISTORY — PX: DILATATION & CURETTAGE/HYSTEROSCOPY WITH MYOSURE: SHX6511

## 2019-06-25 LAB — CBC
HCT: 38.9 % (ref 36.0–46.0)
Hemoglobin: 12.8 g/dL (ref 12.0–15.0)
MCH: 30.3 pg (ref 26.0–34.0)
MCHC: 32.9 g/dL (ref 30.0–36.0)
MCV: 92.2 fL (ref 80.0–100.0)
Platelets: 225 10*3/uL (ref 150–400)
RBC: 4.22 MIL/uL (ref 3.87–5.11)
RDW: 13.2 % (ref 11.5–15.5)
WBC: 5.9 10*3/uL (ref 4.0–10.5)
nRBC: 0 % (ref 0.0–0.2)

## 2019-06-25 SURGERY — DILATATION & CURETTAGE/HYSTEROSCOPY WITH MYOSURE
Anesthesia: General

## 2019-06-25 MED ORDER — DEXAMETHASONE SODIUM PHOSPHATE 10 MG/ML IJ SOLN
INTRAMUSCULAR | Status: AC
  Filled 2019-06-25: qty 1

## 2019-06-25 MED ORDER — LACTATED RINGERS IV SOLN
INTRAVENOUS | Status: DC
  Administered 2019-06-25: 09:00:00 via INTRAVENOUS
  Filled 2019-06-25: qty 1000

## 2019-06-25 MED ORDER — LIDOCAINE 2% (20 MG/ML) 5 ML SYRINGE
INTRAMUSCULAR | Status: DC | PRN
  Administered 2019-06-25: 60 mg via INTRAVENOUS

## 2019-06-25 MED ORDER — OXYCODONE HCL 5 MG/5ML PO SOLN
5.0000 mg | Freq: Once | ORAL | Status: DC | PRN
  Filled 2019-06-25: qty 5

## 2019-06-25 MED ORDER — FENTANYL CITRATE (PF) 100 MCG/2ML IJ SOLN
INTRAMUSCULAR | Status: AC
  Filled 2019-06-25: qty 2

## 2019-06-25 MED ORDER — KETOROLAC TROMETHAMINE 30 MG/ML IJ SOLN
INTRAMUSCULAR | Status: DC | PRN
  Administered 2019-06-25: 30 mg via INTRAVENOUS

## 2019-06-25 MED ORDER — LIDOCAINE 2% (20 MG/ML) 5 ML SYRINGE
INTRAMUSCULAR | Status: AC
  Filled 2019-06-25: qty 5

## 2019-06-25 MED ORDER — SODIUM CHLORIDE 0.9 % IV SOLN
2.0000 g | INTRAVENOUS | Status: AC
  Administered 2019-06-25: 2 g via INTRAVENOUS
  Filled 2019-06-25: qty 2

## 2019-06-25 MED ORDER — OXYCODONE HCL 5 MG PO TABS
5.0000 mg | ORAL_TABLET | Freq: Once | ORAL | Status: DC | PRN
  Filled 2019-06-25: qty 1

## 2019-06-25 MED ORDER — ONDANSETRON HCL 4 MG/2ML IJ SOLN
INTRAMUSCULAR | Status: DC | PRN
  Administered 2019-06-25: 4 mg via INTRAVENOUS

## 2019-06-25 MED ORDER — SODIUM CHLORIDE 0.9 % IV SOLN
INTRAVENOUS | Status: AC
  Filled 2019-06-25: qty 2

## 2019-06-25 MED ORDER — FENTANYL CITRATE (PF) 100 MCG/2ML IJ SOLN
INTRAMUSCULAR | Status: DC | PRN
  Administered 2019-06-25 (×2): 50 ug via INTRAVENOUS

## 2019-06-25 MED ORDER — ONDANSETRON HCL 4 MG/2ML IJ SOLN
INTRAMUSCULAR | Status: AC
  Filled 2019-06-25: qty 2

## 2019-06-25 MED ORDER — SODIUM CHLORIDE 0.9 % IR SOLN
Status: DC | PRN
  Administered 2019-06-25: 3000 mL

## 2019-06-25 MED ORDER — PROPOFOL 10 MG/ML IV BOLUS
INTRAVENOUS | Status: DC | PRN
  Administered 2019-06-25: 170 mg via INTRAVENOUS

## 2019-06-25 MED ORDER — FENTANYL CITRATE (PF) 100 MCG/2ML IJ SOLN
25.0000 ug | INTRAMUSCULAR | Status: DC | PRN
  Filled 2019-06-25: qty 1

## 2019-06-25 MED ORDER — DEXAMETHASONE SODIUM PHOSPHATE 10 MG/ML IJ SOLN
INTRAMUSCULAR | Status: DC | PRN
  Administered 2019-06-25: 10 mg via INTRAVENOUS

## 2019-06-25 MED ORDER — LIDOCAINE HCL 1 % IJ SOLN
INTRAMUSCULAR | Status: DC | PRN
  Administered 2019-06-25: 10 mL

## 2019-06-25 MED ORDER — PROPOFOL 10 MG/ML IV BOLUS
INTRAVENOUS | Status: AC
  Filled 2019-06-25: qty 20

## 2019-06-25 MED ORDER — MIDAZOLAM HCL 2 MG/2ML IJ SOLN
INTRAMUSCULAR | Status: AC
  Filled 2019-06-25: qty 2

## 2019-06-25 MED ORDER — ONDANSETRON HCL 4 MG/2ML IJ SOLN
4.0000 mg | Freq: Four times a day (QID) | INTRAMUSCULAR | Status: DC | PRN
  Filled 2019-06-25: qty 2

## 2019-06-25 MED ORDER — MIDAZOLAM HCL 2 MG/2ML IJ SOLN
INTRAMUSCULAR | Status: DC | PRN
  Administered 2019-06-25: 2 mg via INTRAVENOUS

## 2019-06-25 MED ORDER — KETOROLAC TROMETHAMINE 30 MG/ML IJ SOLN
INTRAMUSCULAR | Status: AC
  Filled 2019-06-25: qty 1

## 2019-06-25 SURGICAL SUPPLY — 20 items
CANISTER SUCT 3000ML PPV (MISCELLANEOUS) ×1 IMPLANT
CATH ROBINSON RED A/P 16FR (CATHETERS) ×2 IMPLANT
COVER WAND RF STERILE (DRAPES) ×2 IMPLANT
DEVICE MYOSURE LITE (MISCELLANEOUS) IMPLANT
DEVICE MYOSURE REACH (MISCELLANEOUS) ×1 IMPLANT
DILATOR CANAL MILEX (MISCELLANEOUS) IMPLANT
GAUZE 4X4 16PLY RFD (DISPOSABLE) ×2 IMPLANT
GLOVE BIO SURGEON STRL SZ7.5 (GLOVE) ×3 IMPLANT
GOWN STRL REUS W/TWL XL LVL3 (GOWN DISPOSABLE) ×2 IMPLANT
IV NS IRRIG 3000ML ARTHROMATIC (IV SOLUTION) ×2 IMPLANT
KIT PROCEDURE FLUENT (KITS) ×2 IMPLANT
KIT TURNOVER CYSTO (KITS) ×2 IMPLANT
MYOSURE XL FIBROID (MISCELLANEOUS)
PACK VAGINAL MINOR WOMEN LF (CUSTOM PROCEDURE TRAY) ×2 IMPLANT
PAD OB MATERNITY 4.3X12.25 (PERSONAL CARE ITEMS) ×2 IMPLANT
SEAL CERVICAL OMNI LOK (ABLATOR) IMPLANT
SEAL ROD LENS SCOPE MYOSURE (ABLATOR) ×2 IMPLANT
SYSTEM TISS REMOVAL MYOSURE XL (MISCELLANEOUS) IMPLANT
TOWEL OR 17X26 10 PK STRL BLUE (TOWEL DISPOSABLE) ×3 IMPLANT
WATER STERILE IRR 500ML POUR (IV SOLUTION) IMPLANT

## 2019-06-25 NOTE — Anesthesia Procedure Notes (Signed)
Procedure Name: LMA Insertion Date/Time: 06/25/2019 9:35 AM Performed by: Wanita Chamberlain, CRNA Pre-anesthesia Checklist: Patient identified, Emergency Drugs available, Suction available, Patient being monitored and Timeout performed Patient Re-evaluated:Patient Re-evaluated prior to induction Oxygen Delivery Method: Circle system utilized Preoxygenation: Pre-oxygenation with 100% oxygen Induction Type: IV induction Ventilation: Mask ventilation without difficulty LMA: LMA inserted LMA Size: 4.0 Number of attempts: 1 Placement Confirmation: positive ETCO2,  CO2 detector and breath sounds checked- equal and bilateral Tube secured with: Tape Dental Injury: Teeth and Oropharynx as per pre-operative assessment

## 2019-06-25 NOTE — H&P (Signed)
The patient was examined.  I reviewed the proposed surgery and consent form with the patient.  The dictated history and physical is current and accurate and all questions were answered. The patient is ready to proceed with surgery and has a realistic understanding and expectation for the outcome.   Anastasio Auerbach MD, 8:47 AM 06/25/2019

## 2019-06-25 NOTE — Discharge Instructions (Signed)
Postoperative Instructions Hysteroscopy D & C  Dr. Phineas Real and the nursing staff have discussed postoperative instructions with you.  If you have any questions please ask them before you leave the hospital, or call Dr Elisabeth Most office at [phone].    We would like to emphasize the following instructions:   ? Call the office to make your follow-up appointment as recommended by Dr Phineas Real (usually 1-2 weeks).  ? You were given a prescription, or one was ordered for you at the pharmacy you designated.  Get that prescription filled and take the medication according to instructions.  ? You may eat a regular diet, but slowly until you start having bowel movements.  ? Drink plenty of water daily.  ? Nothing in the vagina (intercourse, douching, objects of any kind) for two weeks.  When reinitiating intercourse, if it is uncomfortable, stop and make an appointment with Dr Phineas Real to be evaluated.  ? No driving for one to two days until the effects of anesthesia has worn off.  No traveling out of town for several days.  ? You may shower, but no baths for one week.  Walking up and down stairs is ok.  No heavy lifting, prolonged standing, repeated bending or any working out until your post op check.  ? Rest frequently, listen to your body and do not push yourself and overdo it.  ? Call if:  o Your pain medication does not seem strong enough. o Worsening pain or abdominal bloating o Persistent nausea or vomiting o Difficulty with urination or bowel movements. o Temperature of 101 degrees or higher. o Heavy vaginal bleeding.  If your period is due, you may use tampons. o You have any questions or concerns  NO ADVIL, ALEVE, MOTRIN, IBUPROFEN UNTIL 4 PM TODAY   Post Anesthesia Home Care Instructions  Activity: Get plenty of rest for the remainder of the day. A responsible individual must stay with you for 24 hours following the procedure.  For the next 24 hours, DO NOT: -Drive a  car -Paediatric nurse -Drink alcoholic beverages -Take any medication unless instructed by your physician -Make any legal decisions or sign important papers.  Meals: Start with liquid foods such as gelatin or soup. Progress to regular foods as tolerated. Avoid greasy, spicy, heavy foods. If nausea and/or vomiting occur, drink only clear liquids until the nausea and/or vomiting subsides. Call your physician if vomiting continues.  Special Instructions/Symptoms: Your throat may feel dry or sore from the anesthesia or the breathing tube placed in your throat during surgery. If this causes discomfort, gargle with warm salt water. The discomfort should disappear within 24 hours.  If you had a scopolamine patch placed behind your ear for the management of post- operative nausea and/or vomiting:  1. The medication in the patch is effective for 72 hours, after which it should be removed.  Wrap patch in a tissue and discard in the trash. Wash hands thoroughly with soap and water. 2. You may remove the patch earlier than 72 hours if you experience unpleasant side effects which may include dry mouth, dizziness or visual disturbances. 3. Avoid touching the patch. Wash your hands with soap and water after contact with the patch.

## 2019-06-25 NOTE — Transfer of Care (Signed)
Immediate Anesthesia Transfer of Care Note  Patient: Emily Nunez  Procedure(s) Performed: DILATATION & CURETTAGE/HYSTEROSCOPY WITH MYOSURE (N/A )  Patient Location: PACU  Anesthesia Type:General  Level of Consciousness: awake, alert , oriented and patient cooperative  Airway & Oxygen Therapy: Patient Spontanous Breathing and Patient connected to nasal cannula oxygen  Post-op Assessment: Report given to RN and Post -op Vital signs reviewed and stable  Post vital signs: Reviewed and stable  Last Vitals:  Vitals Value Taken Time  BP 118/82 06/25/19 1008  Temp    Pulse    Resp 14 06/25/19 1010  SpO2    Vitals shown include unvalidated device data.  Last Pain:  Vitals:   06/25/19 0817  TempSrc: Oral  PainSc: 0-No pain      Patients Stated Pain Goal: 4 (29/03/79 5583)  Complications: No apparent anesthesia complications

## 2019-06-25 NOTE — Anesthesia Preprocedure Evaluation (Signed)
Anesthesia Evaluation  Patient identified by MRN, date of birth, ID band Patient awake    Reviewed: Allergy & Precautions, H&P , NPO status , Patient's Chart, lab work & pertinent test results  Airway Mallampati: II   Neck ROM: full    Dental   Pulmonary neg pulmonary ROS,    breath sounds clear to auscultation       Cardiovascular negative cardio ROS   Rhythm:regular Rate:Normal     Neuro/Psych    GI/Hepatic   Endo/Other  Hypothyroidism obese  Renal/GU      Musculoskeletal   Abdominal   Peds  Hematology   Anesthesia Other Findings   Reproductive/Obstetrics                             Anesthesia Physical Anesthesia Plan  ASA: II  Anesthesia Plan: General   Post-op Pain Management:    Induction: Intravenous  PONV Risk Score and Plan: 3 and Ondansetron, Dexamethasone, Midazolam and Treatment may vary due to age or medical condition  Airway Management Planned: LMA  Additional Equipment:   Intra-op Plan:   Post-operative Plan:   Informed Consent: I have reviewed the patients History and Physical, chart, labs and discussed the procedure including the risks, benefits and alternatives for the proposed anesthesia with the patient or authorized representative who has indicated his/her understanding and acceptance.       Plan Discussed with: CRNA, Anesthesiologist and Surgeon  Anesthesia Plan Comments:         Anesthesia Quick Evaluation

## 2019-06-25 NOTE — Op Note (Signed)
Emily Nunez 1961/05/02 [id number]   Post Operative Note   Date of surgery:  06/25/2019  Pre Op Dx: Postmenopausal bleeding, endometrial polyp  Post Op Dx: Postmenopausal bleeding, endometrial polyp  Procedure: Hysteroscopy, D&C, MyoSure resection endometrial polyp  Surgeon:  Belinda Block Jeryl Wilbourn  Anesthesia:  General  EBL: 5 cc  Distended media discrepancy: 440 cc saline  Complications:  None  Specimen: #1 endometrial curetting #2 endometrial polyp to pathology  Findings: EUA: External BUS vagina normal with atrophic changes.  Cervix normal with atrophic changes.  Uterus normal size midline mobile.  Adnexa without masses   Hysteroscopy: With fleshy midline septum like connection between anterior and posterior endometrial surfaces.  Resected in its entirety.  Right and left tubal ostia not clearly identified.  Cavity otherwise normal in appearance.  Lower uterine segment and endocervical canal also normal in appearance.  Procedure:  The patient was taken to the operating room, was placed in the low dorsal lithotomy position, underwent general anesthesia, received a perineal/vaginal preparation per nursing personnel and the bladder was emptied with an in and out Foley catheterization. The timeout was performed by the surgical team. An EUA was performed. The patient was draped in the usual fashion. The cervix was visualized with a speculum, anterior lip grasped with a single-tooth tenaculum and a paracervical block was placed using 10 cc's of 1% lidocaine. The cervix was gently dilated to admit the Myosure hysteroscope and hysteroscopy was performed with findings noted above. Using the Myosure Reach resectoscopic wand the fleshy midline septal like tissue was resected in it's entirety to the level the surrounding endometrium. A gentle sharp curettage was performed. Both specimens were sent separately to pathology.  Repeat hysteroscopy showed an empty cavity with good distention and no  evidence of perforation. The instruments were removed and adequate hemostasis was visualized at the tenaculum site and external cervical os.  The specimens were identified for pathology.  The sponge, needle and instrument count were verified correct.  The patient was wanded per protocol.  The patient was awakened without difficulty and was taken to the recovery room in good condition having tolerated the procedure well.   Anastasio Auerbach MD, 10:47 AM 06/25/2019

## 2019-06-26 ENCOUNTER — Encounter (HOSPITAL_BASED_OUTPATIENT_CLINIC_OR_DEPARTMENT_OTHER): Payer: Self-pay | Admitting: Gynecology

## 2019-06-26 NOTE — Anesthesia Postprocedure Evaluation (Signed)
Anesthesia Post Note  Patient: Emily Nunez  Procedure(s) Performed: DILATATION & CURETTAGE/HYSTEROSCOPY WITH MYOSURE (N/A )     Patient location during evaluation: PACU Anesthesia Type: General Level of consciousness: awake and alert Pain management: pain level controlled Vital Signs Assessment: post-procedure vital signs reviewed and stable Respiratory status: spontaneous breathing, nonlabored ventilation, respiratory function stable and patient connected to nasal cannula oxygen Cardiovascular status: blood pressure returned to baseline and stable Postop Assessment: no apparent nausea or vomiting Anesthetic complications: no    Last Vitals:  Vitals:   06/25/19 1045 06/25/19 1130  BP:  103/79  Pulse: 77 62  Resp: 17 12  Temp:  (!) 35.6 C  SpO2: 95% 99%    Last Pain:  Vitals:   06/26/19 1213  TempSrc:   PainSc: 0-No pain                 Tameika Heckmann S

## 2019-08-27 ENCOUNTER — Encounter: Payer: Self-pay | Admitting: Gynecology

## 2019-11-05 ENCOUNTER — Other Ambulatory Visit: Payer: Self-pay

## 2019-11-06 ENCOUNTER — Encounter: Payer: BC Managed Care – PPO | Admitting: Gynecology

## 2020-06-03 ENCOUNTER — Encounter: Payer: Self-pay | Admitting: Obstetrics and Gynecology

## 2020-06-03 ENCOUNTER — Ambulatory Visit (INDEPENDENT_AMBULATORY_CARE_PROVIDER_SITE_OTHER): Payer: BC Managed Care – PPO | Admitting: Obstetrics and Gynecology

## 2020-06-03 ENCOUNTER — Other Ambulatory Visit: Payer: Self-pay

## 2020-06-03 VITALS — BP 122/78 | Ht 65.0 in | Wt 235.0 lb

## 2020-06-03 DIAGNOSIS — Z79890 Hormone replacement therapy (postmenopausal): Secondary | ICD-10-CM

## 2020-06-03 DIAGNOSIS — Z01419 Encounter for gynecological examination (general) (routine) without abnormal findings: Secondary | ICD-10-CM

## 2020-06-03 DIAGNOSIS — L918 Other hypertrophic disorders of the skin: Secondary | ICD-10-CM

## 2020-06-03 DIAGNOSIS — G479 Sleep disorder, unspecified: Secondary | ICD-10-CM

## 2020-06-03 MED ORDER — ESTRADIOL 0.075 MG/24HR TD PTTW: 1.0000 | MEDICATED_PATCH | TRANSDERMAL | 3 refills | Status: DC

## 2020-06-03 MED ORDER — PROGESTERONE MICRONIZED 100 MG PO CAPS: 100.0000 mg | ORAL_CAPSULE | Freq: Every day | ORAL | 3 refills | Status: DC

## 2020-06-03 NOTE — Progress Notes (Signed)
Emily Nunez March 26, 1961 [id number]  SUBJECTIVE:  59 y.o. G3P3003 female for annual routine gynecologic exam. She has no gynecologic concerns.  Had a right breast lumpectomy in the last year for benign lesion.  Also had a hysteroscopy D&C for postmenopausal bleeding with a benign endometrial polyp resected.  She has not had any further vaginal bleeding.  She does complain of increased fatigue and sleep disturbance since discontinuing hormone therapy.  Concerned about a skin tag on her left inner thigh.  Current Outpatient Medications  Medication Sig Dispense Refill  . levothyroxine (SYNTHROID, LEVOTHROID) 125 MCG tablet Take 125 mcg by mouth daily.      Marland Kitchen zolpidem (AMBIEN) 5 MG tablet Take 5 mg by mouth at bedtime as needed for sleep.     No current facility-administered medications for this visit.   Allergies: Ciprofloxacin, Macrobid [nitrofurantoin monohydrate macrocrystals], and Septra [bactrim]  No LMP recorded. Patient has had an ablation.  Past medical history,surgical history, problem list, medications, allergies, family history and social history were all reviewed and documented as reviewed in the EPIC chart.  ROS:  Feeling well. No dyspnea or chest pain on exertion.  No abdominal pain, change in bowel habits, black or bloody stools.  No urinary tract symptoms. GYN ROS: no abnormal bleeding, pelvic pain or discharge, no breast pain or new or enlarging lumps on self exam. No neurological complaints.   OBJECTIVE:  BP 122/78   Ht 5\' 5"  (1.651 m)   Wt 235 lb (106.6 kg)   BMI 39.11 kg/m  The patient appears well, alert, oriented x 3, in no distress. ENT normal.  Neck supple. No cervical or supraclavicular adenopathy or thyromegaly.  Lungs are clear, good air entry, no wheezes, rhonchi or rales. S1 and S2 normal, no murmurs, regular rate and rhythm.  Abdomen soft without tenderness, guarding, mass or organomegaly.  Neurological is normal, no focal findings.  BREAST EXAM:  breasts appear normal, no suspicious masses, no skin or nipple changes or axillary nodes well-healed lumpectomy scar on right breast  PELVIC EXAM: VULVA: normal appearing vulva with no masses, tenderness or lesions, left inner thigh with a fleshy 1 cm skin tag that appears benign, VAGINA: normal appearing vagina with normal color and discharge, no lesions, CERVIX: normal appearing cervix without discharge or lesions, UTERUS and ADNEXA: No palpable abnormality, limited examination due to body habitus  Chaperone: [name] present during the examination  ASSESSMENT:  59 y.o. [id number] here for annual gynecologic exam  PLAN:   1. Postmenopausal.  Postmenopausal bleeding episode on HRT worked up last year with hysteroscopy D&C which revealed a benign endometrial polyp.  She did discontinue HRT on her own but has found recurrence of fatigue and sleep disturbance side effects that are unacceptable for her.  She inquires about going back on HRT.  We first discussed the benefits to include hopeful relief of her symptoms, positive bone health effects.  Risks include thrombotic diseases to include heart attack, stroke, DVT, PE, increased risk for endometrial cancer and/or breast cancer.  In light of the recent endometrial polypectomy, she may be at increased risk for development of overstimulation of the endometrial tissue leading to hyperplasia or cancer.  Standing these risks she would like to go back on HRT.  I suggested going back on the Vivelle-Dot at a lower dose than previously 0.075 mg along with Prometrium 100 mg nightly for endometrial protection.  I gave her a prescription for both with 1 year of refills and she will let  us know if there are any problems or concerns.   2. Pap smear 10/2018.  No significant history of abnormal Pap smears.  Next Pap smear due 2022 following the current guidelines recommending the 3 year interval.  3.  Skin tag of left inner thigh.  Since this lesion is bothersome and  symptomatic for her we could remove it.  I recommended she make another appointment for excision. 4. Mammogram 11/2018.  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  Normal breast exam today.  Encouraged to schedule a mammogram as soon as possible as she is overdue.  She says she will plan to schedule soon. 5. Colonoscopy 2013.  Recommended that she follow up at the recommended interval.  6. DEXA will be recommended as she approaches age 76 at her next exam. 7. Health maintenance.  No labs today as she normally has these completed elsewhere.  Return annually or sooner, prn.  Joseph Pierini MD 06/03/20

## 2020-06-11 ENCOUNTER — Ambulatory Visit: Payer: BC Managed Care – PPO | Admitting: Obstetrics and Gynecology

## 2020-06-11 ENCOUNTER — Other Ambulatory Visit: Payer: Self-pay

## 2020-06-11 ENCOUNTER — Encounter: Payer: Self-pay | Admitting: Obstetrics and Gynecology

## 2020-06-11 VITALS — BP 124/82

## 2020-06-11 DIAGNOSIS — L918 Other hypertrophic disorders of the skin: Secondary | ICD-10-CM

## 2020-06-11 NOTE — Progress Notes (Signed)
   Emily Nunez 14-Apr-1961 [id number]  59 y.o. G3P3003 female presents for a skin tag removal from her left inner thigh.  We discussed the procedure at the last visit and she has no further questions.  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  OBJECTIVE:  BP 124/82   Procedure note Skin tag excision The skin tag area was cleansed with a Betadine swab.  1 mL 1% lidocaine was injected at the base.  Adequate anesthesia was confirmed.  An 11 blade scalpel was used to excise the lesion from the attachment site.  Hemostasis was achieved with application of pressure and silver nitrate cautery.  Bandages applied over the excision site.  The patient tolerated the procedure well.  Skin tag will be sent to pathology for analysis.  Chaperone: [name] present during the procedure    Joseph Pierini MD 06/11/20

## 2020-06-15 LAB — TISSUE SPECIMEN

## 2020-06-15 LAB — PATHOLOGY REPORT

## 2020-06-29 ENCOUNTER — Other Ambulatory Visit: Payer: Self-pay | Admitting: Obstetrics and Gynecology

## 2020-06-29 DIAGNOSIS — Z1231 Encounter for screening mammogram for malignant neoplasm of breast: Secondary | ICD-10-CM

## 2020-07-02 ENCOUNTER — Ambulatory Visit
Admission: RE | Admit: 2020-07-02 | Discharge: 2020-07-02 | Disposition: A | Discharge status: Home / Self Care | Payer: BC Managed Care – PPO | Source: Ambulatory Visit | Attending: Obstetrics and Gynecology | Admitting: Obstetrics and Gynecology

## 2020-07-02 ENCOUNTER — Other Ambulatory Visit: Payer: Self-pay

## 2020-07-02 DIAGNOSIS — Z1231 Encounter for screening mammogram for malignant neoplasm of breast: Secondary | ICD-10-CM

## 2021-05-17 ENCOUNTER — Encounter (HOSPITAL_BASED_OUTPATIENT_CLINIC_OR_DEPARTMENT_OTHER): Payer: Self-pay

## 2021-05-17 ENCOUNTER — Emergency Department (HOSPITAL_BASED_OUTPATIENT_CLINIC_OR_DEPARTMENT_OTHER): Payer: BC Managed Care – PPO

## 2021-05-17 ENCOUNTER — Other Ambulatory Visit: Payer: Self-pay

## 2021-05-17 ENCOUNTER — Emergency Department (HOSPITAL_BASED_OUTPATIENT_CLINIC_OR_DEPARTMENT_OTHER)
Admission: EM | Admit: 2021-05-17 | Discharge: 2021-05-17 | Disposition: A | Discharge status: Home / Self Care | Payer: BC Managed Care – PPO | Attending: Emergency Medicine | Admitting: Emergency Medicine

## 2021-05-17 DIAGNOSIS — E039 Hypothyroidism, unspecified: Secondary | ICD-10-CM | POA: Insufficient documentation

## 2021-05-17 DIAGNOSIS — Z85828 Personal history of other malignant neoplasm of skin: Secondary | ICD-10-CM | POA: Insufficient documentation

## 2021-05-17 DIAGNOSIS — R002 Palpitations: Secondary | ICD-10-CM | POA: Diagnosis present

## 2021-05-17 DIAGNOSIS — Z79899 Other long term (current) drug therapy: Secondary | ICD-10-CM | POA: Diagnosis not present

## 2021-05-17 DIAGNOSIS — I4891 Unspecified atrial fibrillation: Secondary | ICD-10-CM | POA: Insufficient documentation

## 2021-05-17 LAB — BASIC METABOLIC PANEL
Anion gap: 7 (ref 5–15)
BUN: 21 mg/dL — ABNORMAL HIGH (ref 6–20)
CO2: 26 mmol/L (ref 22–32)
Calcium: 9 mg/dL (ref 8.9–10.3)
Chloride: 105 mmol/L (ref 98–111)
Creatinine, Ser: 0.88 mg/dL (ref 0.44–1.00)
GFR, Estimated: >60 mL/min (ref >60)
Glucose, Bld: 100 mg/dL — ABNORMAL HIGH (ref 70–99)
Potassium: 3.8 mmol/L (ref 3.5–5.1)
Sodium: 138 mmol/L (ref 135–145)

## 2021-05-17 LAB — CBC
HCT: 36.5 % (ref 36.0–46.0)
Hemoglobin: 12 g/dL (ref 12.0–15.0)
MCH: 29.6 pg (ref 26.0–34.0)
MCHC: 32.9 g/dL (ref 30.0–36.0)
MCV: 89.9 fL (ref 80.0–100.0)
Platelets: 263 10*3/uL (ref 150–400)
RBC: 4.06 MIL/uL (ref 3.87–5.11)
RDW: 13.7 % (ref 11.5–15.5)
WBC: 5.9 10*3/uL (ref 4.0–10.5)
nRBC: 0 % (ref 0.0–0.2)

## 2021-05-17 LAB — PROTIME-INR
INR: 0.9 (ref 0.8–1.2)
Prothrombin Time: 12.5 seconds (ref 11.4–15.2)

## 2021-05-17 LAB — MAGNESIUM: Magnesium: 2 mg/dL (ref 1.7–2.4)

## 2021-05-17 LAB — TSH: TSH: 0.331 u[IU]/mL — ABNORMAL LOW (ref 0.350–4.500)

## 2021-05-17 MED ORDER — METOPROLOL TARTRATE 25 MG PO TABS: 50.0000 mg | ORAL_TABLET | Freq: Two times a day (BID) | ORAL | 0 refills | Status: DC

## 2021-05-17 MED ORDER — METOPROLOL TARTRATE 5 MG/5ML IV SOLN
2.5000 mg | Freq: Once | INTRAVENOUS | Status: AC
  Administered 2021-05-17: 2.5 mg via INTRAVENOUS
  Filled 2021-05-17: qty 5

## 2021-05-17 MED ORDER — APIXABAN 5 MG PO TABS: 5.0000 mg | ORAL_TABLET | Freq: Two times a day (BID) | ORAL | 0 refills | Status: DC

## 2021-05-17 NOTE — Discharge Instructions (Addendum)
You are seen in the emergency department for evaluation of a rapid heart rate.  You are in atrial fibrillation.  Your lab work chest x-ray and EKG did not show an obvious explanation for why you are in this rhythm.  Cardiology is recommending a blood thinner and some medication to slow your heart rate.  The atrial fibrillation clinic should call you with confirmation of your appointment on Thursday at [facility] [place], [place] [address].  Return to the emergency department if any worsening or concerning symptoms

## 2021-05-17 NOTE — ED Provider Notes (Signed)
[facility] EMERGENCY DEPARTMENT Provider Note   CSN: [id number] Arrival date & time: 05/17/21  1217     History Chief Complaint  Patient presents with   Tachycardia    Emily Nunez is a 60 y.o. female.  He has a history of skin cancer.  She was going to have a bladder suspension surgery today when she was noted to be tachycardic.  She was referred here for further evaluation.  No prior cardiac history.  She does take thyroid medication.  No chest pain shortness of breath lightheadedness dizziness.  She does not know how long her heart rates been elevated.  Does not drink much caffeine.  No alcohol or tobacco.  The history is provided by the patient.  Palpitations Palpitations quality:  Fast Onset quality:  Unable to specify Progression:  Unchanged Chronicity:  New Context: not caffeine, not nicotine and not stimulant use   Relieved by:  None tried Worsened by:  Nothing Ineffective treatments:  None tried Associated symptoms: no chest pain, no chest pressure, no cough, no diaphoresis, no dizziness, no hemoptysis, no lower extremity edema, no nausea, no shortness of breath and no vomiting   Risk factors: hx of thyroid disease   Risk factors: no hx of atrial fibrillation       Past Medical History:  Diagnosis Date   Abnormality of right breast on screening mammogram 06/05/2019   Cancer ([place])    BASAL CELL /NOSE   Cancer ([place]) 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   Hypothyroid    Urinary incontinence    Vitamin D deficiency 09/2010   VIT D = 22    Patient Active Problem List   Diagnosis Date Noted   Abnormality of right breast on screening mammogram 06/05/2019   Hypothyroid    Cancer ([place])    Vitamin D deficiency     Past Surgical History:  Procedure Laterality Date   BASAL CELL CARCINOMA EXCISION     BREAST LUMPECTOMY WITH RADIOACTIVE SEED LOCALIZATION Right 06/05/2019   Procedure: RIGHT BREAST LUMPECTOMY WITH RADIOACTIVE SEED LOCALIZATION;  Surgeon: Fanny Skates,  MD;  Location: [facility];  Service: General;  Laterality: Right;   COLONOSCOPY     DILATATION & CURETTAGE/HYSTEROSCOPY WITH MYOSURE N/A 06/25/2019   Procedure: DILATATION & CURETTAGE/HYSTEROSCOPY WITH MYOSURE;  Surgeon: Anastasio Auerbach, MD;  Location: [facility];  Service: Gynecology;  Laterality: N/A;   ENDOMETRIAL ABLATION  2005   HYDROTHERMAL ABLATION   PELVIC LAPAROSCOPY  04/2004   ENDO/CYST     OB History     Gravida  3   Para  3   Term  3   Preterm      AB      Living  3      SAB      IAB      Ectopic      Multiple      Live Births  3           Family History  Problem Relation Age of Onset   Cancer Mother        COLON   Heart disease Father     Social History   Tobacco Use   Smoking status: Never   Smokeless tobacco: Never  Vaping Use   Vaping Use: Never used  Substance Use Topics   Alcohol use: Yes    Alcohol/week: 0.0 standard drinks    Comment: social   Drug use: No    Home Medications  Prior to Admission medications   Medication Sig Start Date End Date Taking? Authorizing Provider  estradiol (VIVELLE-DOT) 0.075 MG/24HR Place 1 patch onto the skin 2 (two) times a week. 06/04/20   Joseph Pierini, MD  levothyroxine (SYNTHROID, LEVOTHROID) 125 MCG tablet Take 125 mcg by mouth daily.      [provider]  progesterone (PROMETRIUM) 100 MG capsule Take 1 capsule (100 mg total) by mouth daily. 06/03/20   Joseph Pierini, MD  zolpidem (AMBIEN) 5 MG tablet Take 5 mg by mouth at bedtime as needed for sleep.    [provider]    Allergies    Ciprofloxacin, Macrobid [nitrofurantoin monohydrate macrocrystals], and Septra [bactrim]  Review of Systems   Review of Systems  Constitutional:  Negative for diaphoresis and fever.  HENT:  Negative for sore throat.   Eyes:  Negative for visual disturbance.  Respiratory:  Negative for cough, hemoptysis and shortness of breath.   Cardiovascular:   Positive for palpitations. Negative for chest pain.  Gastrointestinal:  Negative for abdominal pain, nausea and vomiting.  Genitourinary:  Positive for frequency. Negative for dysuria.  Musculoskeletal:  Negative for neck pain.  Skin:  Negative for rash.  Neurological:  Negative for dizziness.   Physical Exam Updated Vital Signs BP (!) 112/96 (BP Location: Right Arm)   Pulse (!) 121   Temp 97.8 F (36.6 C) (Oral)   Resp 18   Ht 5\' 6"  (1.676 m)   Wt 107 kg   SpO2 99%   BMI 38.09 kg/m   Physical Exam Vitals and nursing note reviewed.  Constitutional:      General: She is not in acute distress.    Appearance: Normal appearance. She is well-developed.  HENT:     Head: Normocephalic and atraumatic.  Eyes:     Conjunctiva/sclera: Conjunctivae normal.  Cardiovascular:     Rate and Rhythm: Tachycardia present. Rhythm irregular.     Heart sounds: No murmur heard. Pulmonary:     Effort: Pulmonary effort is normal. No respiratory distress.     Breath sounds: Normal breath sounds.  Abdominal:     Palpations: Abdomen is soft.     Tenderness: There is no abdominal tenderness. There is no guarding or rebound.  Musculoskeletal:        General: Normal range of motion.     Cervical back: Neck supple.     Right lower leg: No edema.     Left lower leg: No edema.  Skin:    General: Skin is warm and dry.  Neurological:     General: No focal deficit present.     Mental Status: She is alert and oriented to person, place, and time.    ED Results / Procedures / Treatments   Labs (all labs ordered are listed, but only abnormal results are displayed) Labs Reviewed  BASIC METABOLIC PANEL - Abnormal; Notable for the following components:      Result Value   Glucose, Bld 100 (*)    BUN 21 (*)    All other components within normal limits  TSH - Abnormal; Notable for the following components:   TSH 0.331 (*)    All other components within normal limits  MAGNESIUM  CBC  PROTIME-INR     EKG EKG Interpretation  Date/Time:  Monday May 17 2021 12:37:59 EDT Ventricular Rate:  114 PR Interval:    QRS Duration: 106 QT Interval:  324 QTC Calculation: 445 R Axis:   56 Text Interpretation: Atrial fibrillation No old  tracing to compare Confirmed by Aletta Edouard [phone]) on 05/17/2021 12:42:39 PM  Radiology DG Chest Port 1 View  Result Date: 05/17/2021 CLINICAL DATA:  Tachycardia, preop. EXAM: PORTABLE CHEST 1 VIEW COMPARISON:  None. FINDINGS: Trachea is midline. Heart size is accentuated by AP semi upright technique. Lungs are clear. No pleural fluid. IMPRESSION: No acute findings. Electronically Signed   By: Lorin Picket M.D.   On: 05/17/2021 13:30    Procedures Procedures   Medications Ordered in ED Medications  metoprolol tartrate (LOPRESSOR) injection 2.5 mg (2.5 mg Intravenous Given 05/17/21 1303)    ED Course  I have reviewed the triage vital signs and the nursing notes.  Pertinent labs & imaging results that were available during my care of the patient were reviewed by me and considered in my medical decision making (see chart for details).  Clinical Course as of 05/17/21 1741  Mon May 17, 2021  1330 Chest x-ray does not show any acute infiltrate.  Awaiting radiology reading [MB]  1436 Discussed with Dr. March Rummage cardiology.  She is recommended the patient start on Eliquis 5 twice daily and 25 of metoprolol.  To have an appointment with an A. fib clinic this week.  I reviewed this with the patient and she is comfortable plan for discharge. [MB]    Clinical Course User Index [MB] Hayden Rasmussen, MD   MDM Rules/Calculators/A&P                         This patient complains of tachycardia; this involves an extensive number of treatment Options and is a complaint that carries with it a high risk of complications and Morbidity. The differential includes sinus tach, A. fib, a flutter, metabolic derangement, hyperthyroidism  I ordered, reviewed and  interpreted labs, which included CBC with normal white count normal hemoglobin, chemistries fairly normal, INR normal, TSH pending at time of discharge I ordered medication IV Lopressor with some improvement in her heart rate I ordered imaging studies which included chest x-ray and I independently    visualized and interpreted imaging which showed no acute findings Previous records obtained and reviewed in epic including [facility] visit today where they identified her to be in A. fib I consulted Dr. Ezzie Dural cardiology and discussed lab and imaging findings  Critical Interventions: None  After the interventions stated above, I reevaluated the patient and found patient to be hemodynamically stable.  I reviewed cardiology recommendations with her and she is comfortable plan for anticoagulation and beta-blocker.  Return instructions discussed.  This patients CHA2DS2-VASc Score and unadjusted Ischemic Stroke Rate (% per year) is equal to 0.6 % stroke rate/year from a score of 1  Above score calculated as 1 point each if present [CHF, HTN, DM, Vascular=MI/PAD/Aortic Plaque, Age if 65-74, or Female] Above score calculated as 2 points each if present [Age > 75, or Stroke/TIA/TE]  Final Clinical Impression(s) / ED Diagnoses Final diagnoses:  New onset atrial fibrillation ([place])    Rx / DC Orders ED Discharge Orders          Ordered    apixaban (ELIQUIS) 5 MG TABS tablet  2 times daily        05/17/21 1438    metoprolol tartrate (LOPRESSOR) 25 MG tablet  2 times daily        05/17/21 1438             Hayden Rasmussen, MD 05/17/21 1743

## 2021-05-17 NOTE — ED Triage Notes (Signed)
Pt states she was told at preop today that her HR was too high for surgery-"130s"-denies hx of same-denies CP-NAD-steady gait

## 2021-05-20 ENCOUNTER — Ambulatory Visit (HOSPITAL_COMMUNITY)
Admit: 2021-05-20 | Discharge: 2021-05-20 | Disposition: A | Discharge status: Home / Self Care | Payer: BC Managed Care – PPO | Source: Ambulatory Visit | Attending: Nurse Practitioner | Admitting: Nurse Practitioner

## 2021-05-20 ENCOUNTER — Other Ambulatory Visit: Payer: Self-pay

## 2021-05-20 ENCOUNTER — Encounter (HOSPITAL_COMMUNITY): Payer: Self-pay | Admitting: Nurse Practitioner

## 2021-05-20 VITALS — BP 130/84 | HR 110 | Ht 66.0 in | Wt 237.8 lb

## 2021-05-20 DIAGNOSIS — Z7901 Long term (current) use of anticoagulants: Secondary | ICD-10-CM | POA: Diagnosis not present

## 2021-05-20 DIAGNOSIS — I4891 Unspecified atrial fibrillation: Secondary | ICD-10-CM | POA: Insufficient documentation

## 2021-05-20 DIAGNOSIS — Z881 Allergy status to other antibiotic agents status: Secondary | ICD-10-CM | POA: Insufficient documentation

## 2021-05-20 DIAGNOSIS — Z882 Allergy status to sulfonamides status: Secondary | ICD-10-CM | POA: Diagnosis not present

## 2021-05-20 DIAGNOSIS — Z79899 Other long term (current) drug therapy: Secondary | ICD-10-CM | POA: Insufficient documentation

## 2021-05-20 DIAGNOSIS — Z8249 Family history of ischemic heart disease and other diseases of the circulatory system: Secondary | ICD-10-CM | POA: Diagnosis not present

## 2021-05-20 MED ORDER — METOPROLOL TARTRATE 50 MG PO TABS: 75.0000 mg | ORAL_TABLET | Freq: Two times a day (BID) | ORAL | 1 refills | Status: DC

## 2021-05-20 NOTE — Patient Instructions (Signed)
Increase metoprolol to 75mg twice a day 

## 2021-05-20 NOTE — Progress Notes (Addendum)
Primary Care Physician: Messamer, Norberta Keens, NP Referring Physician: Dr. Alfredo Batty is a 60 y.o. female that is in the afib clinic for f/u of ER visit where pt was evaluated after finding of  new onset afib at time of bladder suspension surgery. She was unaware of elevated HR's.  She was started on metoprolol 50 mg bid and eliquis 5 mg bid. She was d/c in rate controlled afib.   In the afib clinic, pt is still  unaware of afib. She has RVR today at 110 bpm. She has been taking the 50 mg  bid of metoprolol. First full day of eliquis 5 mg bid was yesterday, 6/22. No alcohol, caffeine, tobacco or snoring history, no regular exercise program.     Today, she denies symptoms of palpitations, chest pain, shortness of breath, orthopnea, PND, lower extremity edema, dizziness, presyncope, syncope, or neurologic sequela. The patient is tolerating medications without difficulties and is otherwise without complaint today.   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   Hypothyroid    Urinary incontinence    Vitamin D deficiency 09/2010   VIT D = 22   Past Surgical History:  Procedure Laterality Date   BASAL CELL CARCINOMA EXCISION     BREAST LUMPECTOMY WITH RADIOACTIVE SEED LOCALIZATION Right 06/05/2019   Procedure: RIGHT BREAST LUMPECTOMY WITH RADIOACTIVE SEED LOCALIZATION;  Surgeon: Fanny Skates, MD;  Location: [facility];  Service: General;  Laterality: Right;   COLONOSCOPY     DILATATION & CURETTAGE/HYSTEROSCOPY WITH MYOSURE N/A 06/25/2019   Procedure: [place];  Surgeon: Anastasio Auerbach, MD;  Location: [facility];  Service: Gynecology;  Laterality: N/A;   ENDOMETRIAL ABLATION  2005   HYDROTHERMAL ABLATION   PELVIC LAPAROSCOPY  04/2004   ENDO/CYST    Current Outpatient  Medications  Medication Sig Dispense Refill   apixaban (ELIQUIS) 5 MG TABS tablet Take 1 tablet (5 mg total) by mouth 2 (two) times daily. 60 tablet 0   estradiol (VIVELLE-DOT) 0.075 MG/24HR Place 1 patch onto the skin 2 (two) times a week. 24 patch 3   levothyroxine (SYNTHROID, LEVOTHROID) 125 MCG tablet Take 125 mcg by mouth daily.       metoprolol tartrate (LOPRESSOR) 25 MG tablet Take 2 tablets (50 mg total) by mouth 2 (two) times daily. 60 tablet 0   progesterone (PROMETRIUM) 100 MG capsule Take 1 capsule (100 mg total) by mouth daily. 90 capsule 3   zolpidem (AMBIEN) 5 MG tablet Take 5 mg by mouth at bedtime as needed for sleep.     No current facility-administered medications for this encounter.    Allergies  Allergen Reactions   Ciprofloxacin Nausea Only   Macrobid [Nitrofurantoin Monohydrate Macrocrystals] Nausea Only   Septra [Bactrim] Nausea Only    Social History   Socioeconomic History   Marital status: Married    Spouse name: Not on file   Number of children: Not on file   Years of education: Not on file   Highest education level: Not on file  Occupational History   Not on file  Tobacco Use   Smoking status: Never   Smokeless tobacco: Never  Vaping Use   Vaping Use: Never used  Substance and Sexual Activity   Alcohol use: Yes  Alcohol/week: 0.0 standard drinks    Comment: social   Drug use: No   Sexual activity: Yes    Partners: Male    Birth control/protection: Surgical    Comment:  VASECTOMY-1st intercourse 60 yo-Fewer than 5 partners  Other Topics Concern   Not on file  Social History Narrative   Not on file   Social Determinants of Health   Financial Resource Strain: Not on file  Food Insecurity: Not on file  Transportation Needs: Not on file  Physical Activity: Not on file  Stress: Not on file  Social Connections: Not on file  Intimate Partner Violence: Not on file    Family History  Problem Relation Age of Onset   Cancer Mother         COLON   Heart disease Father     ROS- All systems are reviewed and negative except as per the HPI above  Physical Exam: There were no vitals filed for this visit. Wt Readings from Last 3 Encounters:  05/17/21 107 kg  06/03/20 106.6 kg  06/25/19 108.8 kg    Labs: Lab Results  Component Value Date   NA 138 05/17/2021   K 3.8 05/17/2021   CL 105 05/17/2021   CO2 26 05/17/2021   GLUCOSE 100 (H) 05/17/2021   BUN 21 (H) 05/17/2021   CREATININE 0.88 05/17/2021   CALCIUM 9.0 05/17/2021   MG 2.0 05/17/2021   Lab Results  Component Value Date   INR 0.9 05/17/2021   No results found for: CHOL, HDL, LDLCALC, TRIG   GEN- The patient is well appearing, alert and oriented x 3 today.   Head- normocephalic, atraumatic Eyes-  Sclera clear, conjunctiva pink Ears- hearing intact Oropharynx- clear Neck- supple, no JVP Lymph- no cervical lymphadenopathy Lungs- Clear to ausculation bilaterally, normal work of breathing Heart- irregular rate and rhythm, no murmurs, rubs or gallops, PMI not laterally displaced GI- soft, NT, ND, + BS Extremities- no clubbing, cyanosis, or edema MS- no significant deformity or atrophy Skin- no rash or lesion Psych- euthymic mood, full affect Neuro- strength and sensation are intact  EKG-afib at 110 bpm, qrs int 90 ms, qtc 465 ms  Echo - will need echo when returns to [facility]     Assessment and Plan:  1. New onset afib Asymptomatic but not well rate controlled.  General education re afib  Triggers discussed  Increase metoprolol to 75 mg bid  Stress from surgery and her husband's health may have triggered afib  Encouraged to check BP/HR at home until I see next with BB increase   2. CHA2DS2VASc of 1 Eliquis 5 mg bid  Bleeding precautions discussed   F/u in afib clinic in one week for EKG/BP check If remains in afib, will plan on cardioversion after 21 days of uninterrupted  anticoagulation.   Geroge Baseman Arbor Cohen, [facility] [address] [place], [place] [address] [phone]   wi

## 2021-05-26 NOTE — Addendum Note (Signed)
Encounter addended by: Sherran Needs, NP on: 05/26/2021 11:30 AM  Actions taken: Clinical Note Signed

## 2021-05-27 ENCOUNTER — Encounter (HOSPITAL_COMMUNITY): Payer: BC Managed Care – PPO | Admitting: Nurse Practitioner

## 2021-05-28 ENCOUNTER — Ambulatory Visit (HOSPITAL_COMMUNITY)
Admission: RE | Admit: 2021-05-28 | Discharge: 2021-05-28 | Disposition: A | Discharge status: Home / Self Care | Payer: BC Managed Care – PPO | Source: Ambulatory Visit | Attending: Nurse Practitioner | Admitting: Nurse Practitioner

## 2021-05-28 ENCOUNTER — Other Ambulatory Visit: Payer: Self-pay

## 2021-05-28 VITALS — BP 104/78 | HR 98

## 2021-05-28 DIAGNOSIS — I4819 Other persistent atrial fibrillation: Secondary | ICD-10-CM | POA: Insufficient documentation

## 2021-05-28 NOTE — Progress Notes (Signed)
Patient returns for ECG after increasing metoprolol. ECG shows rate has improved although she remains in afib. HR 98, QRS 90, Qtc 439. We discussed timing of her urologic procedure and DCCV. She states her urologic procedure "cannot wait". Will continue in rate controlled afib until after surgery and 3 weeks of uninterrupted anticoagulation. F/u in AF clinic post surgery to arrange DCCV.

## 2021-06-02 ENCOUNTER — Other Ambulatory Visit: Payer: Self-pay | Admitting: Family Medicine

## 2021-06-02 DIAGNOSIS — Z1231 Encounter for screening mammogram for malignant neoplasm of breast: Secondary | ICD-10-CM

## 2021-06-03 ENCOUNTER — Telehealth (HOSPITAL_COMMUNITY): Payer: Self-pay | Admitting: *Deleted

## 2021-06-03 NOTE — Telephone Encounter (Signed)
Pt called in stating she has been more fatigued on the higher dose of metoprolol and self reduced it this morning to 50mg  - her HR is 86 on the phone. She will continue the metoprolol 50mg  BID and follow her heart rates/symptoms over the next few days if she continues feeling fatigued or not rate controlled will look at medication changes. Pt is pending surgery on the 18th.

## 2021-06-04 ENCOUNTER — Encounter: Payer: BC Managed Care – PPO | Admitting: Obstetrics and Gynecology

## 2021-06-04 MED ORDER — DILTIAZEM HCL ER COATED BEADS 120 MG PO CP24: 120.0000 mg | ORAL_CAPSULE | Freq: Every day | ORAL | 1 refills | Status: DC

## 2021-06-04 NOTE — Telephone Encounter (Signed)
Pt states symptoms continue despite decreased dose of metoprolol HR in the 80-90s.  Discussed with Roderic Palau NP will stop metoprolol and start cardizem 120mg  once a day. Pt to call Monday with update of HR/symptoms.

## 2021-06-07 ENCOUNTER — Other Ambulatory Visit (HOSPITAL_COMMUNITY): Payer: Self-pay | Admitting: *Deleted

## 2021-06-07 MED ORDER — DILTIAZEM HCL ER COATED BEADS 120 MG PO CP24: 120.0000 mg | ORAL_CAPSULE | Freq: Two times a day (BID) | ORAL | 1 refills | Status: DC

## 2021-06-07 MED ORDER — APIXABAN 5 MG PO TABS: 5.0000 mg | ORAL_TABLET | Freq: Two times a day (BID) | ORAL | 3 refills | Status: DC

## 2021-06-07 NOTE — Telephone Encounter (Signed)
Pt states her energy level is greatly improved on cardizem but rates are in the 110s. Discussed with Adline Peals PA will increase cardizem to 120mg  BID. She will let us know if rates are not controlled before her surgery next week.

## 2021-06-09 ENCOUNTER — Other Ambulatory Visit (HOSPITAL_COMMUNITY): Payer: Self-pay | Admitting: *Deleted

## 2021-06-09 MED ORDER — DILTIAZEM HCL ER COATED BEADS 120 MG PO CP24: ORAL_CAPSULE | ORAL | 2 refills | Status: DC

## 2021-06-09 NOTE — Telephone Encounter (Signed)
Pt continues with elevated HRs in the evenings. BP 134/86 HR 90-120s. Discussed with Roderic Palau NP will increase cardizem to 120mg  in the AM and 240mg  in the PM. Pt will call if further issues.

## 2021-06-21 ENCOUNTER — Telehealth (HOSPITAL_COMMUNITY): Payer: Self-pay | Admitting: *Deleted

## 2021-06-21 MED ORDER — DILTIAZEM HCL ER COATED BEADS 120 MG PO CP24: 120.0000 mg | ORAL_CAPSULE | Freq: Two times a day (BID) | ORAL | 2 refills | Status: DC

## 2021-06-21 NOTE — Telephone Encounter (Signed)
Pt called in stating she does not feel she is tolerating diltiazem as she is very fatigued by the end of the day. Pt will try decrease cardizem to '120mg'$  BID until follow up on Thursday. Pt in agreement.

## 2021-06-24 ENCOUNTER — Encounter (HOSPITAL_COMMUNITY): Payer: Self-pay | Admitting: Physician Assistant

## 2021-06-24 ENCOUNTER — Other Ambulatory Visit: Payer: Self-pay

## 2021-06-24 ENCOUNTER — Ambulatory Visit (HOSPITAL_COMMUNITY)
Admission: RE | Admit: 2021-06-24 | Discharge: 2021-06-24 | Disposition: A | Discharge status: Home / Self Care | Payer: BC Managed Care – PPO | Source: Ambulatory Visit | Attending: Physician Assistant | Admitting: Physician Assistant

## 2021-06-24 VITALS — BP 116/84 | HR 128 | Ht 66.0 in | Wt 237.2 lb

## 2021-06-24 DIAGNOSIS — Z79899 Other long term (current) drug therapy: Secondary | ICD-10-CM | POA: Diagnosis not present

## 2021-06-24 DIAGNOSIS — Z888 Allergy status to other drugs, medicaments and biological substances status: Secondary | ICD-10-CM | POA: Diagnosis not present

## 2021-06-24 DIAGNOSIS — Z7901 Long term (current) use of anticoagulants: Secondary | ICD-10-CM | POA: Insufficient documentation

## 2021-06-24 DIAGNOSIS — Z881 Allergy status to other antibiotic agents status: Secondary | ICD-10-CM | POA: Diagnosis not present

## 2021-06-24 DIAGNOSIS — I4819 Other persistent atrial fibrillation: Secondary | ICD-10-CM | POA: Insufficient documentation

## 2021-06-24 DIAGNOSIS — Z882 Allergy status to sulfonamides status: Secondary | ICD-10-CM | POA: Insufficient documentation

## 2021-06-24 LAB — BASIC METABOLIC PANEL
Anion gap: 8 (ref 5–15)
BUN: 22 mg/dL — ABNORMAL HIGH (ref 6–20)
CO2: 23 mmol/L (ref 22–32)
Calcium: 9.5 mg/dL (ref 8.9–10.3)
Chloride: 108 mmol/L (ref 98–111)
Creatinine, Ser: 0.94 mg/dL (ref 0.44–1.00)
GFR, Estimated: >60 mL/min (ref >60)
Glucose, Bld: 116 mg/dL — ABNORMAL HIGH (ref 70–99)
Potassium: 3.9 mmol/L (ref 3.5–5.1)
Sodium: 139 mmol/L (ref 135–145)

## 2021-06-24 LAB — CBC
HCT: 35.8 % — ABNORMAL LOW (ref 36.0–46.0)
Hemoglobin: 11.6 g/dL — ABNORMAL LOW (ref 12.0–15.0)
MCH: 29.5 pg (ref 26.0–34.0)
MCHC: 32.4 g/dL (ref 30.0–36.0)
MCV: 91.1 fL (ref 80.0–100.0)
Platelets: 354 10*3/uL (ref 150–400)
RBC: 3.93 MIL/uL (ref 3.87–5.11)
RDW: 13 % (ref 11.5–15.5)
WBC: 6.6 10*3/uL (ref 4.0–10.5)
nRBC: 0 % (ref 0.0–0.2)

## 2021-06-24 MED ORDER — PROGESTERONE MICRONIZED 100 MG PO CAPS: 100.0000 mg | ORAL_CAPSULE | Freq: Every day | ORAL | 1 refills | Status: DC

## 2021-06-24 MED ORDER — ESTRADIOL 0.075 MG/24HR TD PTTW: 1.0000 | MEDICATED_PATCH | TRANSDERMAL | 1 refills | Status: DC

## 2021-06-24 NOTE — H&P (View-Only) (Signed)
Primary Care Physician: Messamer, Norberta Keens, NP Referring Physician: Dr. Alfredo Batty is a 60 y.o. female that is in the afib clinic for f/u of ER visit where pt was evaluated after finding of  new onset afib at time of bladder suspension surgery. She was unaware of elevated HR's.  She was started on metoprolol 50 mg bid and eliquis 5 mg bid. She was d/c in rate controlled afib.   In the afib clinic, pt is still  unaware of afib. She has RVR today at 110 bpm. She has been taking the 50 mg  bid of metoprolol. First full day of eliquis 5 mg bid was yesterday, 6/22. No alcohol, caffeine, tobacco or snoring history, no regular exercise program.     Follow up in the AF clinic 06/24/21. Patient is now s/p sling for urinary incontinence. She resumed Eliquis on 06/15/21. She did not tolerate higher doses of diltiazem due to fatigue. She feels well today despite being in afib.   Today, she denies symptoms of palpitations, chest pain, shortness of breath, orthopnea, PND, lower extremity edema, dizziness, presyncope, syncope, or neurologic sequela. The patient is tolerating medications without difficulties and is otherwise without complaint today.   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   Hypothyroid    Urinary incontinence    Vitamin D deficiency 09/2010   VIT D = 22   Past Surgical History:  Procedure Laterality Date   BASAL CELL CARCINOMA EXCISION     BREAST LUMPECTOMY WITH RADIOACTIVE SEED LOCALIZATION Right 06/05/2019   Procedure: RIGHT BREAST LUMPECTOMY WITH RADIOACTIVE SEED LOCALIZATION;  Surgeon: Fanny Skates, MD;  Location: [facility];  Service: General;  Laterality: Right;   COLONOSCOPY     DILATATION & CURETTAGE/HYSTEROSCOPY WITH MYOSURE N/A 06/25/2019   Procedure: [place];  Surgeon:  Anastasio Auerbach, MD;  Location: [facility];  Service: Gynecology;  Laterality: N/A;   ENDOMETRIAL ABLATION  2005   HYDROTHERMAL ABLATION   PELVIC LAPAROSCOPY  04/2004   ENDO/CYST    Current Outpatient Medications  Medication Sig Dispense Refill   apixaban (ELIQUIS) 5 MG TABS tablet Take 1 tablet (5 mg total) by mouth 2 (two) times daily. 60 tablet 3   diltiazem (CARDIZEM CD) 120 MG 24 hr capsule Take 1 capsule (120 mg total) by mouth 2 (two) times daily. 90 capsule 2   GEMTESA 75 MG TABS Take 1 tablet by mouth daily.     levothyroxine (SYNTHROID) 150 MCG tablet take 1.5 tablets by mouth every mondays and tuesdays then take 1 tablet by mouth all other days of week     Vitamin D, Ergocalciferol, (DRISDOL) 1.25 MG (50000 UNIT) CAPS capsule Take 1 capsule by mouth once a week.     zolpidem (AMBIEN) 5 MG tablet Take 5 mg by mouth at bedtime as needed for sleep.     estradiol (VIVELLE-DOT) 0.075 MG/24HR Place 1 patch onto the skin 2 (two) times a week. (Patient not taking: Reported on 06/24/2021) 24 patch 1   progesterone (PROMETRIUM) 100 MG capsule Take 1 capsule (100 mg total) by mouth at bedtime. (Patient not taking: Reported on 06/24/2021) 90 capsule 1   No current facility-administered medications for this encounter.    Allergies  Allergen Reactions   Ciprofloxacin Nausea  Only   Macrobid [Nitrofurantoin Monohydrate Macrocrystals] Nausea Only   Septra [Bactrim] Nausea Only   Sertraline Other (See Comments)   Sulfamethoxazole Nausea Only   Sulfamethoxazole-Trimethoprim Other (See Comments) and Nausea Only    Social History   Socioeconomic History   Marital status: Married    Spouse name: Not on file   Number of children: Not on file   Years of education: Not on file   Highest education level: Not on file  Occupational History   Not on file  Tobacco Use   Smoking status: Never   Smokeless tobacco: Never  Vaping Use   Vaping Use: Never used  Substance and  Sexual Activity   Alcohol use: Yes    Comment: social   Drug use: No   Sexual activity: Yes    Partners: Male    Birth control/protection: Surgical    Comment:  VASECTOMY-1st intercourse 60 yo-Fewer than 5 partners  Other Topics Concern   Not on file  Social History Narrative   Not on file   Social Determinants of Health   Financial Resource Strain: Not on file  Food Insecurity: Not on file  Transportation Needs: Not on file  Physical Activity: Not on file  Stress: Not on file  Social Connections: Not on file  Intimate Partner Violence: Not on file    Family History  Problem Relation Age of Onset   Cancer Mother        COLON   Heart disease Father     ROS- All systems are reviewed and negative except as per the HPI above  Physical Exam: Vitals:   06/24/21 1132  BP: 116/84  Pulse: (!) 128  Weight: 107.6 kg  Height: '5\' 6"'$  (1.676 m)   Wt Readings from Last 3 Encounters:  06/24/21 107.6 kg  05/20/21 107.9 kg  05/17/21 107 kg    Labs: Lab Results  Component Value Date   NA 138 05/17/2021   K 3.8 05/17/2021   CL 105 05/17/2021   CO2 26 05/17/2021   GLUCOSE 100 (H) 05/17/2021   BUN 21 (H) 05/17/2021   CREATININE 0.88 05/17/2021   CALCIUM 9.0 05/17/2021   MG 2.0 05/17/2021   Lab Results  Component Value Date   INR 0.9 05/17/2021   No results found for: CHOL, HDL, LDLCALC, TRIG   GEN- The patient is a well appearing obese female, alert and oriented x 3 today.   HEENT-head normocephalic, atraumatic, sclera clear, conjunctiva pink, hearing intact, trachea midline. Lungs- Clear to ausculation bilaterally, normal work of breathing Heart- irregular rate and rhythm, tachycardia, no murmurs, rubs or gallops  GI- soft, NT, ND, + BS Extremities- no clubbing, cyanosis, or edema MS- no significant deformity or atrophy Skin- no rash or lesion Psych- euthymic mood, full affect Neuro- strength and sensation are intact   EKG- afib RVR Vent. rate 128 BPM PR  interval * ms QRS duration 94 ms QT/QTcB 330/481 ms   Assessment and Plan:  1. Persistent atrial fibrillation  Patient remains in afib with rapid rates.  We discussed therapeutic options today. Will plan for TEE/DCCV instead of waiting for 3 weeks of uninterrupted anticoagulation given rapid rates. She did have to hold Eliquis x 2 days for urologic procedure, resumed on 06/15/21.  She has not tolerated higher doses of rate control. HR on physical exam 100-110 bpm. Continue diltiazem 120 mg BID until DCCV, then decrease to once daily. Check cbc/bmet  2. CHA2DS2VASc of 1 Continue Eliquis 5 mg BID  Follow up in the AF clinic post DCCV.    [facility] [address] [place], [place] [address] [phone]

## 2021-06-24 NOTE — Telephone Encounter (Signed)
Last AEX 06/03/20 with Dr. Delilah Shan. Scheduled AEX 10/01/21 with Dr. Marguerita Merles.  Mammo Scheduled 07/29/21.

## 2021-06-24 NOTE — Progress Notes (Signed)
Primary Care Physician: Messamer, Norberta Keens, NP Referring Physician: Dr. Alfredo Batty is a 60 y.o. female that is in the afib clinic for f/u of ER visit where pt was evaluated after finding of  new onset afib at time of bladder suspension surgery. She was unaware of elevated HR's.  She was started on metoprolol 50 mg bid and eliquis 5 mg bid. She was d/c in rate controlled afib.   In the afib clinic, pt is still  unaware of afib. She has RVR today at 110 bpm. She has been taking the 50 mg  bid of metoprolol. First full day of eliquis 5 mg bid was yesterday, 6/22. No alcohol, caffeine, tobacco or snoring history, no regular exercise program.     Follow up in the AF clinic 06/24/21. Patient is now s/p sling for urinary incontinence. She resumed Eliquis on 06/15/21. She did not tolerate higher doses of diltiazem due to fatigue. She feels well today despite being in afib.   Today, she denies symptoms of palpitations, chest pain, shortness of breath, orthopnea, PND, lower extremity edema, dizziness, presyncope, syncope, or neurologic sequela. The patient is tolerating medications without difficulties and is otherwise without complaint today.   Past Medical History:  Diagnosis Date   Abnormality of right breast on screening mammogram 06/05/2019   Cancer ([place])    BASAL CELL /NOSE   Cancer ([place]) 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   Hypothyroid    Urinary incontinence    Vitamin D deficiency 09/2010   VIT D = 22   Past Surgical History:  Procedure Laterality Date   BASAL CELL CARCINOMA EXCISION     BREAST LUMPECTOMY WITH RADIOACTIVE SEED LOCALIZATION Right 06/05/2019   Procedure: RIGHT BREAST LUMPECTOMY WITH RADIOACTIVE SEED LOCALIZATION;  Surgeon: Fanny Skates, MD;  Location: [facility];  Service: General;  Laterality: Right;   COLONOSCOPY     DILATATION & CURETTAGE/HYSTEROSCOPY WITH MYOSURE N/A 06/25/2019   Procedure: [place];  Surgeon:  Anastasio Auerbach, MD;  Location: [facility];  Service: Gynecology;  Laterality: N/A;   ENDOMETRIAL ABLATION  2005   HYDROTHERMAL ABLATION   PELVIC LAPAROSCOPY  04/2004   ENDO/CYST    Current Outpatient Medications  Medication Sig Dispense Refill   apixaban (ELIQUIS) 5 MG TABS tablet Take 1 tablet (5 mg total) by mouth 2 (two) times daily. 60 tablet 3   diltiazem (CARDIZEM CD) 120 MG 24 hr capsule Take 1 capsule (120 mg total) by mouth 2 (two) times daily. 90 capsule 2   GEMTESA 75 MG TABS Take 1 tablet by mouth daily.     levothyroxine (SYNTHROID) 150 MCG tablet take 1.5 tablets by mouth every mondays and tuesdays then take 1 tablet by mouth all other days of week     Vitamin D, Ergocalciferol, (DRISDOL) 1.25 MG (50000 UNIT) CAPS capsule Take 1 capsule by mouth once a week.     zolpidem (AMBIEN) 5 MG tablet Take 5 mg by mouth at bedtime as needed for sleep.     estradiol (VIVELLE-DOT) 0.075 MG/24HR Place 1 patch onto the skin 2 (two) times a week. (Patient not taking: Reported on 06/24/2021) 24 patch 1   progesterone (PROMETRIUM) 100 MG capsule Take 1 capsule (100 mg total) by mouth at bedtime. (Patient not taking: Reported on 06/24/2021) 90 capsule 1   No current facility-administered medications for this encounter.    Allergies  Allergen Reactions   Ciprofloxacin Nausea  Only   Macrobid [Nitrofurantoin Monohydrate Macrocrystals] Nausea Only   Septra [Bactrim] Nausea Only   Sertraline Other (See Comments)   Sulfamethoxazole Nausea Only   Sulfamethoxazole-Trimethoprim Other (See Comments) and Nausea Only    Social History   Socioeconomic History   Marital status: Married    Spouse name: Not on file   Number of children: Not on file   Years of education: Not on file   Highest education level: Not on file  Occupational History   Not on file  Tobacco Use   Smoking status: Never   Smokeless tobacco: Never  Vaping Use   Vaping Use: Never used  Substance and  Sexual Activity   Alcohol use: Yes    Comment: social   Drug use: No   Sexual activity: Yes    Partners: Male    Birth control/protection: Surgical    Comment:  VASECTOMY-1st intercourse 60 yo-Fewer than 5 partners  Other Topics Concern   Not on file  Social History Narrative   Not on file   Social Determinants of Health   Financial Resource Strain: Not on file  Food Insecurity: Not on file  Transportation Needs: Not on file  Physical Activity: Not on file  Stress: Not on file  Social Connections: Not on file  Intimate Partner Violence: Not on file    Family History  Problem Relation Age of Onset   Cancer Mother        COLON   Heart disease Father     ROS- All systems are reviewed and negative except as per the HPI above  Physical Exam: Vitals:   06/24/21 1132  BP: 116/84  Pulse: (!) 128  Weight: 107.6 kg  Height: '5\' 6"'$  (1.676 m)   Wt Readings from Last 3 Encounters:  06/24/21 107.6 kg  05/20/21 107.9 kg  05/17/21 107 kg    Labs: Lab Results  Component Value Date   NA 138 05/17/2021   K 3.8 05/17/2021   CL 105 05/17/2021   CO2 26 05/17/2021   GLUCOSE 100 (H) 05/17/2021   BUN 21 (H) 05/17/2021   CREATININE 0.88 05/17/2021   CALCIUM 9.0 05/17/2021   MG 2.0 05/17/2021   Lab Results  Component Value Date   INR 0.9 05/17/2021   No results found for: CHOL, HDL, LDLCALC, TRIG   GEN- The patient is a well appearing obese female, alert and oriented x 3 today.   HEENT-head normocephalic, atraumatic, sclera clear, conjunctiva pink, hearing intact, trachea midline. Lungs- Clear to ausculation bilaterally, normal work of breathing Heart- irregular rate and rhythm, tachycardia, no murmurs, rubs or gallops  GI- soft, NT, ND, + BS Extremities- no clubbing, cyanosis, or edema MS- no significant deformity or atrophy Skin- no rash or lesion Psych- euthymic mood, full affect Neuro- strength and sensation are intact   EKG- afib RVR Vent. rate 128 BPM PR  interval * ms QRS duration 94 ms QT/QTcB 330/481 ms   Assessment and Plan:  1. Persistent atrial fibrillation  Patient remains in afib with rapid rates.  We discussed therapeutic options today. Will plan for TEE/DCCV instead of waiting for 3 weeks of uninterrupted anticoagulation given rapid rates. She did have to hold Eliquis x 2 days for urologic procedure, resumed on 06/15/21.  She has not tolerated higher doses of rate control. HR on physical exam 100-110 bpm. Continue diltiazem 120 mg BID until DCCV, then decrease to once daily. Check cbc/bmet  2. CHA2DS2VASc of 1 Continue Eliquis 5 mg BID  Follow up in the AF clinic post DCCV.    [facility] [address] [place], [place] [address] [phone]

## 2021-06-24 NOTE — Patient Instructions (Signed)
Day of cardioversion skip morning dose of diltiazem then only take 1 tablet at bedtime going forward  Cardioversion scheduled for Monday, August 1st  - Arrive at the [facility] and go to admitting at 1030AM  - Do not eat or drink anything after midnight the night prior to your procedure.  - Take all your morning medication (except diabetic medications) with a sip of water prior to arrival.  - You will not be able to drive home after your procedure.  - Do NOT miss any doses of your blood thinner - if you should miss a dose please notify our office immediately.  - If you feel as if you go back into normal rhythm prior to scheduled cardioversion, please notify our office immediately. If your procedure is canceled in the cardioversion suite you will be charged a cancellation fee.  Patients will be asked to: to mask in public and hand hygiene (no longer quarantine) in the 3 days prior to surgery, to report if any COVID-19-like illness or household contacts to COVID-19 to determine need for testing

## 2021-06-28 ENCOUNTER — Encounter (HOSPITAL_COMMUNITY)
Admission: RE | Disposition: A | Discharge status: Home / Self Care | Payer: Self-pay | Source: Home / Self Care | Attending: Internal Medicine

## 2021-06-28 ENCOUNTER — Ambulatory Visit (HOSPITAL_COMMUNITY): Payer: BC Managed Care – PPO | Admitting: Certified Registered Nurse Anesthetist

## 2021-06-28 ENCOUNTER — Other Ambulatory Visit: Payer: Self-pay

## 2021-06-28 ENCOUNTER — Encounter (HOSPITAL_COMMUNITY): Payer: Self-pay | Admitting: Internal Medicine

## 2021-06-28 ENCOUNTER — Ambulatory Visit (HOSPITAL_COMMUNITY)
Admission: RE | Admit: 2021-06-28 | Discharge: 2021-06-28 | Disposition: A | Discharge status: Home / Self Care | Payer: BC Managed Care – PPO | Attending: Internal Medicine | Admitting: Internal Medicine

## 2021-06-28 ENCOUNTER — Ambulatory Visit (HOSPITAL_BASED_OUTPATIENT_CLINIC_OR_DEPARTMENT_OTHER): Payer: BC Managed Care – PPO

## 2021-06-28 DIAGNOSIS — Z85828 Personal history of other malignant neoplasm of skin: Secondary | ICD-10-CM | POA: Insufficient documentation

## 2021-06-28 DIAGNOSIS — Z800 Family history of malignant neoplasm of digestive organs: Secondary | ICD-10-CM | POA: Insufficient documentation

## 2021-06-28 DIAGNOSIS — Z888 Allergy status to other drugs, medicaments and biological substances status: Secondary | ICD-10-CM | POA: Insufficient documentation

## 2021-06-28 DIAGNOSIS — Z79890 Hormone replacement therapy (postmenopausal): Secondary | ICD-10-CM | POA: Diagnosis not present

## 2021-06-28 DIAGNOSIS — Z7901 Long term (current) use of anticoagulants: Secondary | ICD-10-CM | POA: Insufficient documentation

## 2021-06-28 DIAGNOSIS — Z881 Allergy status to other antibiotic agents status: Secondary | ICD-10-CM | POA: Diagnosis not present

## 2021-06-28 DIAGNOSIS — Z79899 Other long term (current) drug therapy: Secondary | ICD-10-CM | POA: Diagnosis not present

## 2021-06-28 DIAGNOSIS — Z882 Allergy status to sulfonamides status: Secondary | ICD-10-CM | POA: Insufficient documentation

## 2021-06-28 DIAGNOSIS — Z8249 Family history of ischemic heart disease and other diseases of the circulatory system: Secondary | ICD-10-CM | POA: Insufficient documentation

## 2021-06-28 DIAGNOSIS — I4891 Unspecified atrial fibrillation: Secondary | ICD-10-CM | POA: Diagnosis present

## 2021-06-28 HISTORY — PX: BUBBLE STUDY: SHX6837

## 2021-06-28 HISTORY — PX: TEE WITHOUT CARDIOVERSION: SHX5443

## 2021-06-28 HISTORY — PX: CARDIOVERSION: SHX1299

## 2021-06-28 SURGERY — ECHOCARDIOGRAM, TRANSESOPHAGEAL
Anesthesia: General

## 2021-06-28 MED ORDER — PROPOFOL 500 MG/50ML IV EMUL
INTRAVENOUS | Status: DC | PRN
  Administered 2021-06-28: 125 ug/kg/min via INTRAVENOUS

## 2021-06-28 MED ORDER — PROPOFOL 10 MG/ML IV BOLUS
INTRAVENOUS | Status: DC | PRN
  Administered 2021-06-28: 20 mg via INTRAVENOUS
  Administered 2021-06-28: 10 mg via INTRAVENOUS
  Administered 2021-06-28: 20 mg via INTRAVENOUS

## 2021-06-28 MED ORDER — SODIUM CHLORIDE 0.9 % IV SOLN: INTRAVENOUS | Status: DC

## 2021-06-28 MED ORDER — BUTAMBEN-TETRACAINE-BENZOCAINE 2-2-14 % EX AERO
INHALATION_SPRAY | CUTANEOUS | Status: DC | PRN
  Administered 2021-06-28: 1 via TOPICAL

## 2021-06-28 MED ORDER — SODIUM CHLORIDE 0.9 % IV SOLN: INTRAVENOUS | Status: DC | PRN

## 2021-06-28 MED ORDER — LIDOCAINE 2% (20 MG/ML) 5 ML SYRINGE
INTRAMUSCULAR | Status: DC | PRN
  Administered 2021-06-28: 60 mg via INTRAVENOUS

## 2021-06-28 NOTE — Progress Notes (Signed)
  Echocardiogram Echocardiogram Transesophageal has been performed.  Emily Nunez 06/28/2021, 11:56 AM

## 2021-06-28 NOTE — CV Procedure (Signed)
CARDIOVERSION   PT already sedated for TEE  WIth pads in front/back position, cardioversion attempted with 200 J synchronized biphasic energy   Unsuccessful  WIth pads in front/back position with added compression, cardioversion again attempted with 200 J synchronized biphasic energy   Not successful  Pads placed in apex / base position.  Again, cardioversion attempted wit h200 J synchronized biphasic energy   SUccessful at cardioversion  12 lead EKG pending  Procedure was without complication.  Dorris Carnes MD

## 2021-06-28 NOTE — CV Procedure (Signed)
TEE  Patient's throat anesthetized with Cetacaine spray Mouth guard placed PT anesthetized with Propofol intravenously by anesthesia TEE probe advanced to mid esophagus without difficulty   LA, LAA without masses No PFO by color doppler or with injection of agitated saline  TV normal MV normal  Triv MR AV normal    PV normal  Trace PI  LVEF is moderately depressed  Normal thoracic aorta     Ths is a preliminary report   FUll report to follow in CV section of chart  Procedure was without complications  Dorris Carnes MD

## 2021-06-28 NOTE — Interval H&P Note (Signed)
History and Physical Interval Note:  06/28/2021 10:50 AM  Emily Nunez  has presented today for surgery, with the diagnosis of AFIB.  The various methods of treatment have been discussed with the patient and family. After consideration of risks, benefits and other options for treatment, the patient has consented to  Procedure(s): TRANSESOPHAGEAL ECHOCARDIOGRAM (TEE) (N/A) CARDIOVERSION (N/A) as a surgical intervention.  The patient's history has been reviewed, patient examined, no change in status, stable for surgery.  I have reviewed the patient's chart and labs.  Questions were answered to the patient's satisfaction.     Dorris Carnes

## 2021-06-28 NOTE — Discharge Instructions (Signed)
TEE  YOU HAD AN CARDIAC PROCEDURE TODAY: Refer to the procedure report and other information in the discharge instructions given to you for any specific questions about what was found during the examination. If this information does not answer your questions, please call [facility] office at [phone] to clarify.   DIET: Your first meal following the procedure should be a light meal and then it is ok to progress to your normal diet. A half-sandwich or bowl of soup is an example of a good first meal. Heavy or fried foods are harder to digest and may make you feel nauseous or bloated. Drink plenty of fluids but you should avoid alcoholic beverages for 24 hours. If you had a esophageal dilation, please see attached instructions for diet.   ACTIVITY: Your care partner should take you home directly after the procedure. You should plan to take it easy, moving slowly for the rest of the day. You can resume normal activity the day after the procedure however YOU SHOULD NOT DRIVE, use power tools, machinery or perform tasks that involve climbing or major physical exertion for 24 hours (because of the sedation medicines used during the test).   SYMPTOMS TO REPORT IMMEDIATELY: A cardiologist can be reached at any hour. Please call [phone] for any of the following symptoms:  Vomiting of blood or coffee ground material  New, significant abdominal pain  New, significant chest pain or pain under the shoulder blades  Painful or persistently difficult swallowing  New shortness of breath  Black, tarry-looking or red, bloody stools  FOLLOW UP:  Please also call with any specific questions about appointments or follow up tests.  Electrical Cardioversion Electrical cardioversion is the delivery of a jolt of electricity to restore a normal rhythm to the heart. A rhythm that is too fast or is not regular keeps the heart from pumping well. In this procedure, sticky patches or metal paddles are placed on the  chest to deliver electricity to the heart from a device. This procedure may be done in an emergency if: There is low or no blood pressure as a result of the heart rhythm. Normal rhythm must be restored as fast as possible to protect the brain and heart from further damage. It may save a life. This may also be a scheduled procedure for irregular or fast heart rhythms that are not immediately life-threatening.  What can I expect after the procedure? Your blood pressure, heart rate, breathing rate, and blood oxygen level will be monitored until you leave the hospital or clinic. Your heart rhythm will be watched to make sure it does not change. You may have some redness on the skin where the shocks were given. Over the counter cortizone cream may be helpful.  Follow these instructions at home: Do not drive for 24 hours if you were given a sedative during your procedure. Take over-the-counter and prescription medicines only as told by your health care provider. Ask your health care provider how to check your pulse. Check it often. Rest for 48 hours after the procedure or as told by your health care provider. Avoid or limit your caffeine use as told by your health care provider. Keep all follow-up visits as told by your health care provider. This is important. Contact a health care provider if: You feel like your heart is beating too quickly or your pulse is not regular. You have a serious muscle cramp that does not go away. Get help right away if: You have discomfort in  your chest. You are dizzy or you feel faint. You have trouble breathing or you are short of breath. Your speech is slurred. You have trouble moving an arm or leg on one side of your body. Your fingers or toes turn cold or blue. Summary Electrical cardioversion is the delivery of a jolt of electricity to restore a normal rhythm to the heart. This procedure may be done right away in an emergency or may be a scheduled procedure  if the condition is not an emergency. Generally, this is a safe procedure. After the procedure, check your pulse often as told by your health care provider. This information is not intended to replace advice given to you by your health care provider. Make sure you discuss any questions you have with your health care provider. Document Revised: 06/17/2019 Document Reviewed: 06/17/2019 Elsevier Patient Education  [place].

## 2021-06-28 NOTE — Anesthesia Preprocedure Evaluation (Signed)
Anesthesia Evaluation  Patient identified by MRN, date of birth, ID band Patient awake    Reviewed: Allergy & Precautions, H&P , NPO status , Patient's Chart, lab work & pertinent test results  Airway Mallampati: II   Neck ROM: full    Dental   Pulmonary neg pulmonary ROS,    breath sounds clear to auscultation       Cardiovascular + dysrhythmias Atrial Fibrillation  Rhythm:irregular Rate:Normal     Neuro/Psych    GI/Hepatic   Endo/Other  Hypothyroidism obese  Renal/GU      Musculoskeletal   Abdominal   Peds  Hematology   Anesthesia Other Findings   Reproductive/Obstetrics                             Anesthesia Physical Anesthesia Plan  ASA: 3  Anesthesia Plan: MAC   Post-op Pain Management:    Induction: Intravenous  PONV Risk Score and Plan: 2 and Propofol infusion and Treatment may vary due to age or medical condition  Airway Management Planned: Nasal Cannula  Additional Equipment:   Intra-op Plan:   Post-operative Plan:   Informed Consent: I have reviewed the patients History and Physical, chart, labs and discussed the procedure including the risks, benefits and alternatives for the proposed anesthesia with the patient or authorized representative who has indicated his/her understanding and acceptance.     Dental advisory given  Plan Discussed with: CRNA, Anesthesiologist and Surgeon  Anesthesia Plan Comments:         Anesthesia Quick Evaluation

## 2021-06-28 NOTE — Transfer of Care (Signed)
Immediate Anesthesia Transfer of Care Note  Patient: Emily Nunez  Procedure(s) Performed: TRANSESOPHAGEAL ECHOCARDIOGRAM (TEE) CARDIOVERSION BUBBLE STUDY  Patient Location: Endoscopy Unit  Anesthesia Type:General  Level of Consciousness: awake, alert , oriented, patient cooperative and responds to stimulation  Airway & Oxygen Therapy: Patient Spontanous Breathing and Patient connected to nasal cannula oxygen  Post-op Assessment: Report given to RN and Post -op Vital signs reviewed and stable  Post vital signs: Reviewed and stable  Last Vitals:  Vitals Value Taken Time  BP    Temp    Pulse 79 06/28/21 1144  Resp 17 06/28/21 1144  SpO2 95 % 06/28/21 1144  Vitals shown include unvalidated device data.  Last Pain:  Vitals:   06/28/21 1051  TempSrc: Oral  PainSc: 0-No pain         Complications: No notable events documented.

## 2021-06-29 ENCOUNTER — Encounter (HOSPITAL_COMMUNITY): Payer: Self-pay | Admitting: Internal Medicine

## 2021-06-29 NOTE — Anesthesia Postprocedure Evaluation (Addendum)
Anesthesia Post Note  Patient: Emily Nunez  Procedure(s) Performed: TRANSESOPHAGEAL ECHOCARDIOGRAM (TEE) CARDIOVERSION BUBBLE STUDY     Patient location during evaluation: Endoscopy Anesthesia Type: General Level of consciousness: awake and alert Pain management: pain level controlled Vital Signs Assessment: post-procedure vital signs reviewed and stable Respiratory status: spontaneous breathing, nonlabored ventilation, respiratory function stable and patient connected to nasal cannula oxygen Cardiovascular status: stable and blood pressure returned to baseline Postop Assessment: no apparent nausea or vomiting Anesthetic complications: no   No notable events documented.  Last Vitals:  Vitals:   06/28/21 1150 06/28/21 1200  BP: 106/77 104/82  Pulse: 84 77  Resp: 19 12  Temp:    SpO2: 95% 97%    Last Pain:  Vitals:   06/28/21 1200  TempSrc:   PainSc: 0-No pain                 Shreeya Recendiz S

## 2021-07-08 ENCOUNTER — Encounter (HOSPITAL_COMMUNITY): Payer: Self-pay | Admitting: Nurse Practitioner

## 2021-07-08 ENCOUNTER — Ambulatory Visit (HOSPITAL_COMMUNITY)
Admission: RE | Admit: 2021-07-08 | Discharge: 2021-07-08 | Disposition: A | Discharge status: Home / Self Care | Payer: BC Managed Care – PPO | Source: Ambulatory Visit | Attending: Nurse Practitioner | Admitting: Nurse Practitioner

## 2021-07-08 ENCOUNTER — Other Ambulatory Visit: Payer: Self-pay

## 2021-07-08 VITALS — BP 124/74 | HR 123 | Ht 66.0 in | Wt 240.2 lb

## 2021-07-08 DIAGNOSIS — Z79899 Other long term (current) drug therapy: Secondary | ICD-10-CM | POA: Insufficient documentation

## 2021-07-08 DIAGNOSIS — I4819 Other persistent atrial fibrillation: Secondary | ICD-10-CM | POA: Insufficient documentation

## 2021-07-08 DIAGNOSIS — I4892 Unspecified atrial flutter: Secondary | ICD-10-CM | POA: Insufficient documentation

## 2021-07-08 DIAGNOSIS — Z7901 Long term (current) use of anticoagulants: Secondary | ICD-10-CM | POA: Diagnosis not present

## 2021-07-08 DIAGNOSIS — D6869 Other thrombophilia: Secondary | ICD-10-CM

## 2021-07-08 MED ORDER — DILTIAZEM HCL ER COATED BEADS 120 MG PO CP24: 120.0000 mg | ORAL_CAPSULE | Freq: Two times a day (BID) | ORAL | Status: DC

## 2021-07-08 MED ORDER — DILTIAZEM HCL ER COATED BEADS 120 MG PO CP24
120.0000 mg | ORAL_CAPSULE | Freq: Every day | ORAL | Status: DC
Start: 2021-07-08 — End: 2021-07-08

## 2021-07-08 NOTE — Progress Notes (Signed)
Primary Care Physician: Orpah Melter, MD Referring Physician: Dr. Alfredo Batty is a 60 y.o. female that is in the afib clinic for f/u of ER visit where pt was evaluated after finding of  new onset afib at time of bladder suspension surgery. She was unaware of elevated HR's.  She was started on metoprolol 50 mg bid and eliquis 5 mg bid. She was d/c in rate controlled afib.   In the afib clinic, pt is still  unaware of afib. She has RVR today at 110 bpm. She has been taking the 50 mg  bid of metoprolol. First full day of eliquis 5 mg bid was yesterday, 6/22. No alcohol, caffeine, tobacco or snoring history, no regular exercise program.     F/u in afib clinic 8/11. She had a successful cardioversion but unfortunately went back into afib maybe yesterday.  She has RVR today. She had moderately reduced EF by TEE and Dr. Harrington Challenger also talked to her that she needed a sleep study. Referred today. She did get her second shingles shot Tuesday.    Today, she denies symptoms of palpitations, chest pain, shortness of breath, orthopnea, PND, lower extremity edema, dizziness, presyncope, syncope, or neurologic sequela. The patient is tolerating medications without difficulties and is otherwise without complaint today.   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   Hypothyroid    Urinary incontinence    Vitamin D deficiency 09/2010   VIT D = 22   Past Surgical History:  Procedure Laterality Date   BASAL CELL CARCINOMA EXCISION     BREAST LUMPECTOMY WITH RADIOACTIVE SEED LOCALIZATION Right 06/05/2019   Procedure: RIGHT BREAST LUMPECTOMY WITH RADIOACTIVE SEED LOCALIZATION;  Surgeon: Fanny Skates, MD;  Location: [place];  Service: General;  Laterality: Right;   BUBBLE STUDY  06/28/2021   Procedure: BUBBLE STUDY;  Surgeon: Fay Records, MD;   Location: [place];  Service: Cardiovascular;;   CARDIOVERSION N/A 06/28/2021   Procedure: CARDIOVERSION;  Surgeon: Fay Records, MD;  Location: [place];  Service: Cardiovascular;  Laterality: N/A;   COLONOSCOPY     [place] N/A 06/25/2019   Procedure: [place];  Surgeon: Anastasio Auerbach, MD;  Location: [place];  Service: Gynecology;  Laterality: N/A;   ENDOMETRIAL ABLATION  2005   HYDROTHERMAL ABLATION   PELVIC LAPAROSCOPY  04/2004   ENDO/CYST   TEE WITHOUT CARDIOVERSION N/A 06/28/2021   Procedure: TRANSESOPHAGEAL ECHOCARDIOGRAM (TEE);  Surgeon: Fay Records, MD;  Location: [facility] ENDOSCOPY;  Service: Cardiovascular;  Laterality: N/A;    Current Outpatient Medications  Medication Sig Dispense Refill   acetaminophen (TYLENOL) 500 MG tablet Take 500-1,000 mg by mouth every 6 (six) hours as needed for mild pain or fever.     apixaban (ELIQUIS) 5 MG TABS tablet Take 1 tablet (5 mg total) by mouth 2 (two) times daily. 60 tablet 3   estradiol (VIVELLE-DOT) 0.075 MG/24HR Place 1 patch onto the skin 2 (two) times a week. 24 patch 1   levothyroxine (SYNTHROID) 150 MCG tablet Take 150-225 mcg by mouth See admin instructions. Take 1.5 tablets by mouth every Monday and Tuesday then take 1 tablet by mouth all other days of week     oxybutynin (DITROPAN) 5 MG tablet Take  5 mg by mouth 3 (three) times daily.     progesterone (PROMETRIUM) 100 MG capsule Take 1 capsule (100 mg total) by mouth at bedtime. 90 capsule 1   Vitamin D, Ergocalciferol, (DRISDOL) 1.25 MG (50000 UNIT) CAPS capsule Take 50,000 Units by mouth every Sunday.     zolpidem (AMBIEN) 10 MG tablet Take 10 mg by mouth at bedtime as needed for sleep.     diltiazem (CARDIZEM CD) 120 MG 24 hr capsule Take 1 capsule (120 mg total) by mouth 2 (two) times daily.     No current facility-administered medications for this encounter.     Allergies  Allergen Reactions   Vibegron Other (See Comments) and Nausea Only   Ciprofloxacin Nausea Only   Macrobid [Nitrofurantoin Monohydrate Macrocrystals] Nausea Only   Septra [Bactrim] Nausea Only   Sertraline Other (See Comments)    Unknown    Social History   Socioeconomic History   Marital status: Married    Spouse name: Not on file   Number of children: Not on file   Years of education: Not on file   Highest education level: Not on file  Occupational History   Not on file  Tobacco Use   Smoking status: Never   Smokeless tobacco: Never  Vaping Use   Vaping Use: Never used  Substance and Sexual Activity   Alcohol use: Yes    Comment: social   Drug use: No   Sexual activity: Yes    Partners: Male    Birth control/protection: Surgical    Comment:  VASECTOMY-1st intercourse 60 yo-Fewer than 5 partners  Other Topics Concern   Not on file  Social History Narrative   Not on file   Social Determinants of Health   Financial Resource Strain: Not on file  Food Insecurity: Not on file  Transportation Needs: Not on file  Physical Activity: Not on file  Stress: Not on file  Social Connections: Not on file  Intimate Partner Violence: Not on file    Family History  Problem Relation Age of Onset   Cancer Mother        COLON   Heart disease Father     ROS- All systems are reviewed and negative except as per the HPI above  Physical Exam: Vitals:   07/08/21 1512  BP: 124/74  Pulse: (!) 123  Weight: 109 kg  Height: '5\' 6"'$  (1.676 m)   Wt Readings from Last 3 Encounters:  07/08/21 109 kg  06/28/21 107.6 kg  06/24/21 107.6 kg    Labs: Lab Results  Component Value Date   NA 139 06/24/2021   K 3.9 06/24/2021   CL 108 06/24/2021   CO2 23 06/24/2021   GLUCOSE 116 (H) 06/24/2021   BUN 22 (H) 06/24/2021   CREATININE 0.94 06/24/2021   CALCIUM 9.5 06/24/2021   MG 2.0 05/17/2021   Lab Results  Component Value Date   INR 0.9 05/17/2021   No results  found for: CHOL, HDL, LDLCALC, TRIG   GEN- The patient is well appearing, alert and oriented x 3 today.   Head- normocephalic, atraumatic Eyes-  Sclera clear, conjunctiva pink Ears- hearing intact Oropharynx- clear Neck- supple, no JVP Lymph- no cervical lymphadenopathy Lungs- Clear to ausculation bilaterally, normal work of breathing Heart- irregular rate and rhythm, no murmurs, rubs or gallops, PMI not laterally displaced GI- soft, NT, ND, + BS Extremities- no clubbing, cyanosis, or edema MS- no significant deformity or atrophy Skin- no rash or lesion Psych- euthymic mood,  full affect Neuro- strength and sensation are intact  EKG-afib at 123 bpm, qrs int 84 ms, qtc 420  ms  TEE=  1. The left ventricle has moderately decreased function.   2. Right ventricular systolic function is mildly reduced. The right  ventricular size is normal.   3. Left atrial size was mildly dilated. No left atrial/left atrial  appendage thrombus was detected.   4. The mitral valve is normal in structure. Trivial mitral valve  regurgitation.   5. The aortic valve is tricuspid. Aortic valve regurgitation is trivial.   6. Agitated saline contrast bubble study was negative, with no evidence  of any interatrial shunt.    Assessment and Plan:  1. New onset afib Successful cardioversion but unfortunately has returned to afib with RVR Increase Cardizem 120 mg bid  Return on Tuesday if still in afib, discussed pt she may need Tikosyn to restore SR, especially with TEE showing moderately reduced EF  Gave pt info sheet re Tikosyn admit today  Will refer to sleep study as Dr. Harrington Challenger mentioned to pt she may need one, she snores   2. CHA2DS2VASc of 1 Eliquis 5 mg bid, continue same     F/u in afib clinic Tuesday   Lanecia Sliva C. Vaiden Adames, [facility] [address] [place], [place] [address] [phone]

## 2021-07-08 NOTE — Patient Instructions (Signed)
Increase cardizem '120mg'$  twice a day

## 2021-07-09 ENCOUNTER — Telehealth: Payer: Self-pay | Admitting: *Deleted

## 2021-07-09 NOTE — Telephone Encounter (Signed)
Patient scheduled and notified of sleep study appointment. Per [employer] no PA is required. Plan has opted out of AIM.

## 2021-07-09 NOTE — Telephone Encounter (Signed)
-----   Message from Juluis Mire, RN sent at 07/08/2021  4:07 PM EDT ----- Regarding: sleep study Pt for sleep study for afib, witnessed apnea per donna carroll Thanks stacy

## 2021-07-13 ENCOUNTER — Encounter (HOSPITAL_COMMUNITY): Payer: BC Managed Care – PPO | Admitting: Nurse Practitioner

## 2021-07-14 ENCOUNTER — Telehealth: Payer: Self-pay | Admitting: Pharmacist

## 2021-07-14 ENCOUNTER — Other Ambulatory Visit: Payer: Self-pay

## 2021-07-14 ENCOUNTER — Ambulatory Visit (HOSPITAL_COMMUNITY)
Admission: RE | Admit: 2021-07-14 | Discharge: 2021-07-14 | Disposition: A | Discharge status: Home / Self Care | Payer: BC Managed Care – PPO | Source: Ambulatory Visit | Attending: Nurse Practitioner | Admitting: Nurse Practitioner

## 2021-07-14 DIAGNOSIS — Z79899 Other long term (current) drug therapy: Secondary | ICD-10-CM | POA: Insufficient documentation

## 2021-07-14 DIAGNOSIS — I4892 Unspecified atrial flutter: Secondary | ICD-10-CM | POA: Diagnosis not present

## 2021-07-14 DIAGNOSIS — I4891 Unspecified atrial fibrillation: Secondary | ICD-10-CM | POA: Insufficient documentation

## 2021-07-14 NOTE — Telephone Encounter (Signed)
Medication list reviewed in anticipation of upcoming Tikosyn initiation. Patient is taking diltiazem which can increase concentrations of Tikosyn. Ok to continue, will need to monitor QTc closely. She does not take any contraindicated medications.   Patient is anticoagulated on Eliquis '5mg'$  BID on the appropriate dose. Please ensure that patient has not missed any anticoagulation doses in the 3 weeks prior to Tikosyn initiation.   Patient will need to be counseled to avoid use of Benadryl while on Tikosyn and in the 2-3 days prior to Tikosyn initiation.

## 2021-07-14 NOTE — Progress Notes (Signed)
In for EKG with increase of Cardizem 120 mg bid for additional rate control on board. Ekg shows afib at 96 bpm. Previous HR was 123 bpm. He is interested in admission for Tikosyn since she had ERAF after cardioversion. She is tentatively scheduled for tikosyn admit 9/6. We will be in touch with pt  to further discuss admission.

## 2021-07-28 ENCOUNTER — Telehealth (HOSPITAL_COMMUNITY): Payer: Self-pay | Admitting: *Deleted

## 2021-07-28 NOTE — Telephone Encounter (Signed)
Inpatient hospital admission for 9/6 approved through [facility] Ref # [id number].

## 2021-07-29 ENCOUNTER — Ambulatory Visit: Payer: BC Managed Care – PPO

## 2021-07-30 ENCOUNTER — Other Ambulatory Visit (HOSPITAL_COMMUNITY): Payer: Self-pay | Admitting: Nurse Practitioner

## 2021-08-03 ENCOUNTER — Ambulatory Visit (HOSPITAL_COMMUNITY)
Admission: RE | Admit: 2021-08-03 | Discharge: 2021-08-03 | Disposition: A | Discharge status: Home / Self Care | Payer: BC Managed Care – PPO | Source: Ambulatory Visit | Attending: Nurse Practitioner | Admitting: Nurse Practitioner

## 2021-08-03 ENCOUNTER — Encounter (HOSPITAL_COMMUNITY): Payer: Self-pay | Admitting: Nurse Practitioner

## 2021-08-03 ENCOUNTER — Inpatient Hospital Stay (HOSPITAL_COMMUNITY)
Admission: AD | Admit: 2021-08-03 | Discharge: 2021-08-06 | DRG: 310 | Disposition: A | Discharge status: Home / Self Care | Payer: BC Managed Care – PPO | Source: Ambulatory Visit | Attending: Cardiology | Admitting: Cardiology

## 2021-08-03 ENCOUNTER — Other Ambulatory Visit: Payer: Self-pay

## 2021-08-03 ENCOUNTER — Encounter (HOSPITAL_COMMUNITY): Payer: Self-pay | Admitting: Cardiology

## 2021-08-03 ENCOUNTER — Other Ambulatory Visit (HOSPITAL_COMMUNITY): Payer: Self-pay | Admitting: *Deleted

## 2021-08-03 VITALS — BP 108/64 | HR 111 | Ht 66.0 in | Wt 240.0 lb

## 2021-08-03 DIAGNOSIS — Z800 Family history of malignant neoplasm of digestive organs: Secondary | ICD-10-CM

## 2021-08-03 DIAGNOSIS — I428 Other cardiomyopathies: Secondary | ICD-10-CM | POA: Diagnosis present

## 2021-08-03 DIAGNOSIS — Z8249 Family history of ischemic heart disease and other diseases of the circulatory system: Secondary | ICD-10-CM | POA: Diagnosis not present

## 2021-08-03 DIAGNOSIS — I4891 Unspecified atrial fibrillation: Secondary | ICD-10-CM | POA: Diagnosis present

## 2021-08-03 DIAGNOSIS — Z79899 Other long term (current) drug therapy: Secondary | ICD-10-CM

## 2021-08-03 DIAGNOSIS — Z7901 Long term (current) use of anticoagulants: Secondary | ICD-10-CM | POA: Diagnosis not present

## 2021-08-03 DIAGNOSIS — Z881 Allergy status to other antibiotic agents status: Secondary | ICD-10-CM | POA: Diagnosis not present

## 2021-08-03 DIAGNOSIS — I4819 Other persistent atrial fibrillation: Principal | ICD-10-CM

## 2021-08-03 DIAGNOSIS — Z6838 Body mass index (BMI) 38.0-38.9, adult: Secondary | ICD-10-CM | POA: Diagnosis not present

## 2021-08-03 DIAGNOSIS — E669 Obesity, unspecified: Secondary | ICD-10-CM | POA: Diagnosis present

## 2021-08-03 DIAGNOSIS — E559 Vitamin D deficiency, unspecified: Secondary | ICD-10-CM | POA: Diagnosis present

## 2021-08-03 DIAGNOSIS — Z85828 Personal history of other malignant neoplasm of skin: Secondary | ICD-10-CM | POA: Diagnosis not present

## 2021-08-03 DIAGNOSIS — Z888 Allergy status to other drugs, medicaments and biological substances status: Secondary | ICD-10-CM | POA: Diagnosis not present

## 2021-08-03 DIAGNOSIS — R0683 Snoring: Secondary | ICD-10-CM | POA: Diagnosis present

## 2021-08-03 DIAGNOSIS — E039 Hypothyroidism, unspecified: Secondary | ICD-10-CM | POA: Diagnosis present

## 2021-08-03 DIAGNOSIS — Z79890 Hormone replacement therapy (postmenopausal): Secondary | ICD-10-CM | POA: Diagnosis not present

## 2021-08-03 DIAGNOSIS — D6869 Other thrombophilia: Secondary | ICD-10-CM

## 2021-08-03 LAB — SARS CORONAVIRUS 2 (TAT 6-24 HRS): SARS Coronavirus 2: NEGATIVE

## 2021-08-03 LAB — HIV ANTIBODY (ROUTINE TESTING W REFLEX): HIV Screen 4th Generation wRfx: NONREACTIVE

## 2021-08-03 LAB — BASIC METABOLIC PANEL
Anion gap: 10 (ref 5–15)
Anion gap: 6 (ref 5–15)
BUN: 23 mg/dL — ABNORMAL HIGH (ref 6–20)
BUN: 23 mg/dL — ABNORMAL HIGH (ref 6–20)
CO2: 24 mmol/L (ref 22–32)
CO2: 28 mmol/L (ref 22–32)
Calcium: 9.5 mg/dL (ref 8.9–10.3)
Calcium: 9.3 mg/dL (ref 8.9–10.3)
Chloride: 105 mmol/L (ref 98–111)
Chloride: 104 mmol/L (ref 98–111)
Creatinine, Ser: 1.11 mg/dL — ABNORMAL HIGH (ref 0.44–1.00)
Creatinine, Ser: 1.26 mg/dL — ABNORMAL HIGH (ref 0.44–1.00)
GFR, Estimated: 57 mL/min — ABNORMAL LOW (ref >60)
GFR, Estimated: 49 mL/min — ABNORMAL LOW (ref >60)
Glucose, Bld: 123 mg/dL — ABNORMAL HIGH (ref 70–99)
Glucose, Bld: 112 mg/dL — ABNORMAL HIGH (ref 70–99)
Potassium: 3.5 mmol/L (ref 3.5–5.1)
Potassium: 4.3 mmol/L (ref 3.5–5.1)
Sodium: 139 mmol/L (ref 135–145)
Sodium: 138 mmol/L (ref 135–145)

## 2021-08-03 LAB — MAGNESIUM: Magnesium: 2 mg/dL (ref 1.7–2.4)

## 2021-08-03 MED ORDER — PROGESTERONE MICRONIZED 100 MG PO CAPS
100.0000 mg | ORAL_CAPSULE | Freq: Every day | ORAL | Status: DC
  Administered 2021-08-03 – 2021-08-06 (×3): 100 mg via ORAL
  Filled 2021-08-03 (×5): qty 1

## 2021-08-03 MED ORDER — LEVOTHYROXINE SODIUM 75 MCG PO TABS
150.0000 ug | ORAL_TABLET | ORAL | Status: DC
  Administered 2021-08-04 – 2021-08-06 (×3): 150 ug via ORAL
  Filled 2021-08-03 (×3): qty 2

## 2021-08-03 MED ORDER — POTASSIUM CHLORIDE CRYS ER 20 MEQ PO TBCR: 40.0000 meq | EXTENDED_RELEASE_TABLET | Freq: Once | ORAL | 0 refills | Status: DC

## 2021-08-03 MED ORDER — POTASSIUM CHLORIDE CRYS ER 20 MEQ PO TBCR
60.0000 meq | EXTENDED_RELEASE_TABLET | Freq: Once | ORAL | Status: AC
  Administered 2021-08-03: 60 meq via ORAL
  Filled 2021-08-03: qty 3

## 2021-08-03 MED ORDER — SODIUM CHLORIDE 0.9 % IV SOLN: 250.0000 mL | INTRAVENOUS | Status: DC | PRN

## 2021-08-03 MED ORDER — DILTIAZEM HCL ER COATED BEADS 120 MG PO CP24
120.0000 mg | ORAL_CAPSULE | Freq: Two times a day (BID) | ORAL | Status: DC
  Administered 2021-08-03 – 2021-08-06 (×6): 120 mg via ORAL
  Filled 2021-08-03 (×6): qty 1

## 2021-08-03 MED ORDER — MAGNESIUM SULFATE 2 GM/50ML IV SOLN
2.0000 g | Freq: Once | INTRAVENOUS | Status: AC
  Administered 2021-08-03: 2 g via INTRAVENOUS
  Filled 2021-08-03: qty 50

## 2021-08-03 MED ORDER — SODIUM CHLORIDE 0.9% FLUSH
3.0000 mL | Freq: Two times a day (BID) | INTRAVENOUS | Status: DC
  Administered 2021-08-03 – 2021-08-06 (×5): 3 mL via INTRAVENOUS

## 2021-08-03 MED ORDER — ESTRADIOL 0.075 MG/24HR TD PTTW: 1.0000 | MEDICATED_PATCH | TRANSDERMAL | Status: DC

## 2021-08-03 MED ORDER — DOFETILIDE 500 MCG PO CAPS
500.0000 ug | ORAL_CAPSULE | Freq: Two times a day (BID) | ORAL | Status: DC
  Administered 2021-08-03 – 2021-08-06 (×6): 500 ug via ORAL
  Filled 2021-08-03 (×6): qty 1

## 2021-08-03 MED ORDER — ZOLPIDEM TARTRATE 5 MG PO TABS
5.0000 mg | ORAL_TABLET | Freq: Every evening | ORAL | Status: DC | PRN
  Administered 2021-08-03 – 2021-08-04 (×2): 5 mg via ORAL
  Filled 2021-08-03 (×2): qty 1

## 2021-08-03 MED ORDER — ACETAMINOPHEN 500 MG PO TABS
500.0000 mg | ORAL_TABLET | Freq: Four times a day (QID) | ORAL | Status: DC | PRN
  Administered 2021-08-03 – 2021-08-05 (×2): 1000 mg via ORAL
  Filled 2021-08-03 (×2): qty 2

## 2021-08-03 MED ORDER — SODIUM CHLORIDE 0.9% FLUSH: 3.0000 mL | INTRAVENOUS | Status: DC | PRN

## 2021-08-03 MED ORDER — INFLUENZA VAC SPLIT QUAD 0.5 ML IM SUSY: 0.5000 mL | PREFILLED_SYRINGE | INTRAMUSCULAR | Status: DC

## 2021-08-03 MED ORDER — APIXABAN 5 MG PO TABS
5.0000 mg | ORAL_TABLET | Freq: Two times a day (BID) | ORAL | Status: DC
  Administered 2021-08-03 – 2021-08-06 (×6): 5 mg via ORAL
  Filled 2021-08-03 (×7): qty 1

## 2021-08-03 MED ORDER — LEVOTHYROXINE SODIUM 75 MCG PO TABS: 225.0000 ug | ORAL_TABLET | ORAL | Status: DC

## 2021-08-03 NOTE — H&P (Signed)
Primary Care Physician: Orpah Melter, MD Referring Physician: Dr. Alfredo Batty is a 60 y.o. female that is in the afib clinic for f/u of ER visit, 05/17/21, where pt was evaluated after finding of new onset afib at time of bladder suspension surgery on the same day. She was unaware of elevated HR's. Onset of afib unknown. She was started on metoprolol 50 mg bid and eliquis 5 mg bid. She was d/c in rate controlled afib.    In the afib clinic, 05/20/21, pt is still  unaware of afib. She has RVR today at 110 bpm. She has been taking the 50 mg  bid of metoprolol. First full day of eliquis 5 mg bid was yesterday, 6/22. No alcohol, caffeine, tobacco or snoring history, no regular exercise program.      F/u in afib clinic 8/11. She had a successful cardioversion but unfortunately went back into afib maybe yesterday.  She has RVR today. She had moderately reduced EF by TEE and Dr. Harrington Challenger also talked to her that she needed a sleep study. Referred today. She did get her second shingles shot Tuesday.     F/u in afib clinic, 08/03/21 for admission for Tikosyn. She remains in afib with RVR. She denies any benadryl use. No missed eliquis dose for at 3 weeks. She tolerates afib well but will need an echo several weeks after resuming SR to see if EF did return to normal. Could possibly be an ablation candidate down the line.    Today, she denies symptoms of palpitations, chest pain, shortness of breath, orthopnea, PND, lower extremity edema, dizziness, presyncope, syncope, or neurologic sequela. The patient is tolerating medications without difficulties and is otherwise without complaint today.        Past Medical History:  Diagnosis Date   Abnormality of right breast on screening mammogram 06/05/2019   Cancer ([place])      BASAL CELL /NOSE   Cancer ([place]) 2012    BASAL CELL NOSE   Hypothyroid     Urinary incontinence     Vitamin D deficiency 09/2010    VIT D = 22         Past Surgical History:   Procedure Laterality Date   BASAL CELL CARCINOMA EXCISION       BREAST LUMPECTOMY WITH RADIOACTIVE SEED LOCALIZATION Right 06/05/2019    Procedure: RIGHT BREAST LUMPECTOMY WITH RADIOACTIVE SEED LOCALIZATION;  Surgeon: Fanny Skates, MD;  Location: [place];  Service: General;  Laterality: Right;   BUBBLE STUDY   06/28/2021    Procedure: BUBBLE STUDY;  Surgeon: Fay Records, MD;  Location: [place];  Service: Cardiovascular;;   CARDIOVERSION N/A 06/28/2021    Procedure: CARDIOVERSION;  Surgeon: Fay Records, MD;  Location: [place];  Service: Cardiovascular;  Laterality: N/A;   COLONOSCOPY       [place] N/A 06/25/2019    Procedure: [place];  Surgeon: Anastasio Auerbach, MD;  Location: [place];  Service: Gynecology;  Laterality: N/A;   ENDOMETRIAL ABLATION   2005    HYDROTHERMAL ABLATION   PELVIC LAPAROSCOPY   04/2004    ENDO/CYST   TEE WITHOUT CARDIOVERSION N/A 06/28/2021    Procedure: TRANSESOPHAGEAL ECHOCARDIOGRAM (TEE);  Surgeon: Fay Records, MD;  Location: [facility] ENDOSCOPY;  Service: Cardiovascular;  Laterality: N/A;            Current Outpatient Medications  Medication Sig Dispense Refill  acetaminophen (TYLENOL) 500 MG tablet Take 500-1,000 mg by mouth every 6 (six) hours as needed for mild pain or fever.       apixaban (ELIQUIS) 5 MG TABS tablet Take 1 tablet (5 mg total) by mouth 2 (two) times daily. 60 tablet 3   diltiazem (CARDIZEM CD) 120 MG 24 hr capsule Take 1 capsule (120 mg total) by mouth 2 (two) times daily.       estradiol (VIVELLE-DOT) 0.075 MG/24HR Place 1 patch onto the skin 2 (two) times a week. 24 patch 1   levothyroxine (SYNTHROID) 150 MCG tablet Take 150-225 mcg by mouth See admin instructions. Take 1.5 tablets by mouth every Monday and Tuesday then take 1 tablet by mouth all other days of week       progesterone (PROMETRIUM) 100 MG capsule Take  1 capsule (100 mg total) by mouth at bedtime. 90 capsule 1   Vitamin D, Ergocalciferol, (DRISDOL) 1.25 MG (50000 UNIT) CAPS capsule Take 50,000 Units by mouth every Sunday.       zolpidem (AMBIEN) 10 MG tablet Take 10 mg by mouth at bedtime as needed for sleep.        No current facility-administered medications for this encounter.           Allergies  Allergen Reactions   Vibegron Other (See Comments) and Nausea Only   Ciprofloxacin Nausea Only   Macrobid [Nitrofurantoin Monohydrate Macrocrystals] Nausea Only   Septra [Bactrim] Nausea Only   Sertraline Other (See Comments)      Unknown      Social History         Socioeconomic History   Marital status: Married      Spouse name: Not on file   Number of children: Not on file   Years of education: Not on file   Highest education level: Not on file  Occupational History   Not on file  Tobacco Use   Smoking status: Never   Smokeless tobacco: Never  Vaping Use   Vaping Use: Never used  Substance and Sexual Activity   Alcohol use: Yes      Comment: social   Drug use: No   Sexual activity: Yes      Partners: Male      Birth control/protection: Surgical      Comment:  VASECTOMY-1st intercourse 60 yo-Fewer than 5 partners  Other Topics Concern   Not on file  Social History Narrative   Not on file    Social Determinants of Health    Financial Resource Strain: Not on file  Food Insecurity: Not on file  Transportation Needs: Not on file  Physical Activity: Not on file  Stress: Not on file  Social Connections: Not on file  Intimate Partner Violence: Not on file           Family History  Problem Relation Age of Onset   Cancer Mother          COLON   Heart disease Father        ROS- All systems are reviewed and negative except as per the HPI above   Physical Exam:    Vitals:    08/03/21 1120  BP: 108/64  Pulse: (!) 111  Weight: 108.9 kg  Height: '5\' 6"'$  (1.676 m)       Wt Readings from Last 3 Encounters:   08/03/21 108.9 kg  07/08/21 109 kg  06/28/21 107.6 kg      Labs: Recent Labs  Lab Results  Component Value Date    NA 139 06/24/2021    K 3.9 06/24/2021    CL 108 06/24/2021    CO2 23 06/24/2021    GLUCOSE 116 (H) 06/24/2021    BUN 22 (H) 06/24/2021    CREATININE 0.94 06/24/2021    CALCIUM 9.5 06/24/2021    MG 2.0 05/17/2021      Recent Labs       Lab Results  Component Value Date    INR 0.9 05/17/2021      Recent Labs  No results found for: CHOL, HDL, LDLCALC, TRIG       GEN- The patient is well appearing, alert and oriented x 3 today.   Head- normocephalic, atraumatic Eyes-  Sclera clear, conjunctiva pink Ears- hearing intact Oropharynx- clear Neck- supple, no JVP Lymph- no cervical lymphadenopathy Lungs- Clear to ausculation bilaterally, normal work of breathing Heart- irregular rate and rhythm, no murmurs, rubs or gallops, PMI not laterally displaced GI- soft, NT, ND, + BS Extremities- no clubbing, cyanosis, or edema MS- no significant deformity or atrophy Skin- no rash or lesion Psych- euthymic mood, full affect Neuro- strength and sensation are intact   EKG-afib at 123 bpm, qrs int 84 ms, qtc 420  ms  TEE=  1. The left ventricle has moderately decreased function.   2. Right ventricular systolic function is mildly reduced. The right  ventricular size is normal.   3. Left atrial size was mildly dilated. No left atrial/left atrial  appendage thrombus was detected.   4. The mitral valve is normal in structure. Trivial mitral valve  regurgitation.   5. The aortic valve is tricuspid. Aortic valve regurgitation is trivial.   6. Agitated saline contrast bubble study was negative, with no evidence  of any interatrial shunt.      Assessment and Plan:  1. New onset afib in JUne 2022 Onset unknown as pt is asymptomatic  Successful cardioversion but unfortunately has returned to afib with RVR Increased Cardizem 120 mg bid for RVR She is here now for  Tikosyn admit  to restore SR With TEE showing moderately reduced EF, she is not an candidate for Multaq or flecainide, on young side for amiodarone and is already on thyroid replacement for hypothyroidism  Referred  to sleep study as Dr. Harrington Challenger mentioned to pt she may need one, she snores  Qtc in afib at 456 ms today  Ekg was not done at time of successful cardioversion so I do not have qtc in SR    2. CHA2DS2VASc of 1 Eliquis 5 mg bid, continue same, no missed doses for at least 3 weeks       Butch Penny C. Carroll, [facility] [address] [place], [place] [address] [phone]      ---------------------------------------------------------------  I have seen, examined the patient, and reviewed the above assessment and plan.    Ms Lipko presents for dofetilide loading. Unclear onset for her AF. EF moderately decreased. Rhythm control is indicated. Would recommend loading with dofetilide now and discussing ablation as outpatient. We will arrange follow up to continue these discussions.  Continue eliquis.  Today's ECG: QTc acceptable for initial dose of dofetilide. QTc ~ 454m. AF.  Repeat BMP pending to confirm K within acceptable limits to start dofetilide.   CVickie Epley MD 08/03/2021 8:20 PM

## 2021-08-03 NOTE — Progress Notes (Signed)
Primary Care Physician: Orpah Melter, MD Referring Physician: [name] is a 60 y.o. female that is in the afib clinic for f/u of ER visit, 05/17/21, where pt was evaluated after finding of new onset afib at time of bladder suspension surgery on the same day. She was unaware of elevated HR's. Onset of afib unknown. She was started on metoprolol 50 mg bid and eliquis 5 mg bid. She was d/c in rate controlled afib.   In the afib clinic, 05/20/21, pt is still  unaware of afib. She has RVR today at 110 bpm. She has been taking the 50 mg  bid of metoprolol. First full day of eliquis 5 mg bid was yesterday, 6/22. No alcohol, caffeine, tobacco or snoring history, no regular exercise program.     F/u in afib clinic 8/11. She had a successful cardioversion but unfortunately went back into afib maybe yesterday.  She has RVR today. She had moderately reduced EF by TEE and Dr. Harrington Challenger also talked to her that she needed a sleep study. Referred today. She did get her second shingles shot Tuesday.    F/u in afib clinic, 08/03/21 for admission for Tikosyn. She remains in afib with RVR. She denies any benadryl use. No missed eliquis dose for at 3 weeks. She tolerates afib well but will need an echo several weeks after resuming SR to see if EF did return to normal. Could possibly be an ablation candidate down the line.   Today, she denies symptoms of palpitations, chest pain, shortness of breath, orthopnea, PND, lower extremity edema, dizziness, presyncope, syncope, or neurologic sequela. The patient is tolerating medications without difficulties and is otherwise without complaint today.   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   Hypothyroid    Urinary incontinence    Vitamin D deficiency 09/2010   VIT D = 22   Past Surgical History:  Procedure Laterality  Date   BASAL CELL CARCINOMA EXCISION     BREAST LUMPECTOMY WITH RADIOACTIVE SEED LOCALIZATION Right 06/05/2019   Procedure: RIGHT BREAST LUMPECTOMY WITH RADIOACTIVE SEED LOCALIZATION;  Surgeon: Fanny Skates, MD;  Location: [facility];  Service: General;  Laterality: Right;   BUBBLE STUDY  06/28/2021   Procedure: BUBBLE STUDY;  Surgeon: Fay Records, MD;  Location: [facility];  Service: Cardiovascular;;   CARDIOVERSION N/A 06/28/2021   Procedure: CARDIOVERSION;  Surgeon: Fay Records, MD;  Location: [facility];  Service: Cardiovascular;  Laterality: N/A;   COLONOSCOPY     [place] N/A 06/25/2019   Procedure: [place];  Surgeon: Anastasio Auerbach, MD;  Location: [facility];  Service: Gynecology;  Laterality: N/A;   ENDOMETRIAL ABLATION  2005   HYDROTHERMAL ABLATION   PELVIC LAPAROSCOPY  04/2004   ENDO/CYST   TEE WITHOUT CARDIOVERSION N/A 06/28/2021   Procedure: TRANSESOPHAGEAL ECHOCARDIOGRAM (TEE);  Surgeon: Fay Records, MD;  Location: [facility] ENDOSCOPY;  Service: Cardiovascular;  Laterality: N/A;    Current Outpatient Medications  Medication Sig Dispense Refill   acetaminophen (TYLENOL) 500 MG tablet Take 500-1,000 mg by mouth every 6 (six) hours as needed for mild pain or fever.     apixaban (ELIQUIS) 5 MG TABS tablet Take 1 tablet (5 mg total) by mouth 2 (two) times  daily. 60 tablet 3   diltiazem (CARDIZEM CD) 120 MG 24 hr capsule Take 1 capsule (120 mg total) by mouth 2 (two) times daily.     estradiol (VIVELLE-DOT) 0.075 MG/24HR Place 1 patch onto the skin 2 (two) times a week. 24 patch 1   levothyroxine (SYNTHROID) 150 MCG tablet Take 150-225 mcg by mouth See admin instructions. Take 1.5 tablets by mouth every Monday and Tuesday then take 1 tablet by mouth all other days of week     progesterone (PROMETRIUM) 100 MG capsule Take 1 capsule (100 mg total) by mouth at bedtime. 90  capsule 1   Vitamin D, Ergocalciferol, (DRISDOL) 1.25 MG (50000 UNIT) CAPS capsule Take 50,000 Units by mouth every Sunday.     zolpidem (AMBIEN) 10 MG tablet Take 10 mg by mouth at bedtime as needed for sleep.     No current facility-administered medications for this encounter.    Allergies  Allergen Reactions   Vibegron Other (See Comments) and Nausea Only   Ciprofloxacin Nausea Only   Macrobid [Nitrofurantoin Monohydrate Macrocrystals] Nausea Only   Septra [Bactrim] Nausea Only   Sertraline Other (See Comments)    Unknown    Social History   Socioeconomic History   Marital status: Married    Spouse name: Not on file   Number of children: Not on file   Years of education: Not on file   Highest education level: Not on file  Occupational History   Not on file  Tobacco Use   Smoking status: Never   Smokeless tobacco: Never  Vaping Use   Vaping Use: Never used  Substance and Sexual Activity   Alcohol use: Yes    Comment: social   Drug use: No   Sexual activity: Yes    Partners: Male    Birth control/protection: Surgical    Comment:  VASECTOMY-1st intercourse 60 yo-Fewer than 5 partners  Other Topics Concern   Not on file  Social History Narrative   Not on file   Social Determinants of Health   Financial Resource Strain: Not on file  Food Insecurity: Not on file  Transportation Needs: Not on file  Physical Activity: Not on file  Stress: Not on file  Social Connections: Not on file  Intimate Partner Violence: Not on file    Family History  Problem Relation Age of Onset   Cancer Mother        COLON   Heart disease Father     ROS- All systems are reviewed and negative except as per the HPI above  Physical Exam: Vitals:   08/03/21 1120  BP: 108/64  Pulse: (!) 111  Weight: 108.9 kg  Height: '5\' 6"'$  (1.676 m)   Wt Readings from Last 3 Encounters:  08/03/21 108.9 kg  07/08/21 109 kg  06/28/21 107.6 kg    Labs: Lab Results  Component Value Date    NA 139 06/24/2021   K 3.9 06/24/2021   CL 108 06/24/2021   CO2 23 06/24/2021   GLUCOSE 116 (H) 06/24/2021   BUN 22 (H) 06/24/2021   CREATININE 0.94 06/24/2021   CALCIUM 9.5 06/24/2021   MG 2.0 05/17/2021   Lab Results  Component Value Date   INR 0.9 05/17/2021   No results found for: CHOL, HDL, LDLCALC, TRIG   GEN- The patient is well appearing, alert and oriented x 3 today.   Head- normocephalic, atraumatic Eyes-  Sclera clear, conjunctiva pink Ears- hearing intact Oropharynx- clear Neck- supple, no JVP Lymph- no  cervical lymphadenopathy Lungs- Clear to ausculation bilaterally, normal work of breathing Heart- irregular rate and rhythm, no murmurs, rubs or gallops, PMI not laterally displaced GI- soft, NT, ND, + BS Extremities- no clubbing, cyanosis, or edema MS- no significant deformity or atrophy Skin- no rash or lesion Psych- euthymic mood, full affect Neuro- strength and sensation are intact  EKG-afib at 123 bpm, qrs int 84 ms, qtc 420  ms  TEE=  1. The left ventricle has moderately decreased function.   2. Right ventricular systolic function is mildly reduced. The right  ventricular size is normal.   3. Left atrial size was mildly dilated. No left atrial/left atrial  appendage thrombus was detected.   4. The mitral valve is normal in structure. Trivial mitral valve  regurgitation.   5. The aortic valve is tricuspid. Aortic valve regurgitation is trivial.   6. Agitated saline contrast bubble study was negative, with no evidence  of any interatrial shunt.    Assessment and Plan:  1. New onset afib in JUne 2022 Onset unknown as pt is asymptomatic  Successful cardioversion but unfortunately has returned to afib with RVR Increased Cardizem 120 mg bid for RVR She is here now for Tikosyn admit  to restore SR With TEE showing moderately reduced EF, she is not an candidate for Multaq or flecainide, on young side for amiodarone and is already on thyroid replacement for  hypothyroidism  Referred  to sleep study as Dr. Harrington Challenger mentioned to pt she may need one, she snores  Qtc in afib at 456 ms today  Ekg was not done at time of successful cardioversion so I do not have qtc in SR   2. CHA2DS2VASc of 1 Eliquis 5 mg bid, continue same, no missed doses for at least 3 weeks   To 6E when bed available     F/u in afib clinic Tuesday   Emily Nunez, [facility] [address] [place], [place] [address] [phone]

## 2021-08-03 NOTE — Progress Notes (Signed)
Pharmacy: Dofetilide (Tikosyn) - Initial Consult Assessment and Electrolyte Replacement  Pharmacy consulted to assist in monitoring and replacing electrolytes in this 60 y.o. female admitted on 08/03/2021 undergoing dofetilide initiation. First dofetilide dose: 500 mcg PO BID.  Assessment:  Patient Exclusion Criteria: If any screening criteria checked as "Yes", then  patient  should NOT receive dofetilide until criteria item is corrected.  If "Yes" please indicate correction plan.  YES  NO Patient  Exclusion Criteria Correction Plan   '[x]'$   '[]'$   Baseline QTc interval is greater than or equal to 440 msec. IF above YES box checked dofetilide contraindicated unless patient has ICD; then may proceed if QTc 500-550 msec or with known ventricular conduction abnormalities may proceed with QTc 550-600 msec. QTc = 456 msec in afib clinic today; Cardiology wishes to proceed with dofetilide    '[]'$   '[x]'$   Patient is known or suspected to have a digoxin level greater than 2 ng/ml: No results found for: DIGOXIN     '[]'$   '[x]'$   Creatinine clearance less than 20 ml/min (calculated using Cockcroft-Gault, actual body weight and serum creatinine): Estimated Creatinine Clearance: 67.1 mL/min (A) (by C-G formula based on SCr of 1.11 mg/dL (H)). TBW CrCl = 91 ml/min    '[]'$   '[x]'$  Patient has received drugs known to prolong the QT intervals within the last 48 hours (phenothiazines, tricyclics or tetracyclic antidepressants, erythromycin, H-1 antihistamines, cisapride, fluoroquinolones, azithromycin, ondansetron).   Updated information on QT prolonging agents is available to be searched on the following database:QT prolonging agents     '[]'$   '[x]'$   Patient received a dose of hydrochlorothiazide (Oretic) alone or in any combination including triamterene (Dyazide, Maxzide) in the last 48 hours.    '[]'$   '[x]'$  Patient received a medication known to increase dofetilide plasma concentrations prior to initial dofetilide  dose:  Trimethoprim (Primsol, Proloprim) in the last 36 hours Verapamil (Calan, Verelan) in the last 36 hours or a sustained release dose in the last 72 hours Megestrol (Megace) in the last 5 days  Cimetidine (Tagamet) in the last 6 hours Ketoconazole (Nizoral) in the last 24 hours Itraconazole (Sporanox) in the last 48 hours  Prochlorperazine (Compazine) in the last 36 hours     '[]'$   '[x]'$   Patient is known to have a history of torsades de pointes; congenital or acquired long QT syndromes.    '[]'$   '[x]'$   Patient has received a Class 1 antiarrhythmic with less than 2 half-lives since last dose. (Disopyramide, Quinidine, Procainamide, Lidocaine, Mexiletine, Flecainide, Propafenone)    '[]'$   '[x]'$   Patient has received amiodarone therapy in the past 3 months or amiodarone level is greater than 0.3 ng/ml.    Patient has been appropriately anticoagulated with apixaban.  Labs:    Component Value Date/Time   K 3.5 08/03/2021 1218   MG 2.0 08/03/2021 1218     Plan: Potassium: K 3.5-3.7:  Hold Tikosyn initiation and give KCl 60 mEq po x1 and repeat BMET 2hr after dose - repeat appropriate dose if K < 4    Magnesium: Mg 1.8-2: Give Mg 2 gm IV x1 to prevent Mg from dropping below 1.8 - do not need to recheck Mg. Appropriate to initiate Tikosyn  Thank you for allowing pharmacy to participate in this patient's care.  Gillermina Hu, PharmD, BCPS, [facility] Clinical Pharmacist 08/03/2021  4:56 PM

## 2021-08-03 NOTE — Progress Notes (Signed)
Awaiting BMP results in order to give first dose of tikosyn-labs drawn at [facility]. Jessie Foot, RN

## 2021-08-04 ENCOUNTER — Inpatient Hospital Stay (HOSPITAL_COMMUNITY): Payer: BC Managed Care – PPO

## 2021-08-04 DIAGNOSIS — I4891 Unspecified atrial fibrillation: Secondary | ICD-10-CM

## 2021-08-04 LAB — ECHOCARDIOGRAM COMPLETE
Area-P 1/2: 3.66 cm2
Height: 66 in
S' Lateral: 3.4 cm
Single Plane A4C EF: 61 %
Weight: 3820.8 oz

## 2021-08-04 LAB — BASIC METABOLIC PANEL
Anion gap: 6 (ref 5–15)
BUN: 23 mg/dL — ABNORMAL HIGH (ref 6–20)
CO2: 24 mmol/L (ref 22–32)
Calcium: 9 mg/dL (ref 8.9–10.3)
Chloride: 105 mmol/L (ref 98–111)
Creatinine, Ser: 1.07 mg/dL — ABNORMAL HIGH (ref 0.44–1.00)
GFR, Estimated: 59 mL/min — ABNORMAL LOW (ref >60)
Glucose, Bld: 98 mg/dL (ref 70–99)
Potassium: 4.2 mmol/L (ref 3.5–5.1)
Sodium: 135 mmol/L (ref 135–145)

## 2021-08-04 LAB — CBC
HCT: 34.1 % — ABNORMAL LOW (ref 36.0–46.0)
Hemoglobin: 11.2 g/dL — ABNORMAL LOW (ref 12.0–15.0)
MCH: 29.4 pg (ref 26.0–34.0)
MCHC: 32.8 g/dL (ref 30.0–36.0)
MCV: 89.5 fL (ref 80.0–100.0)
Platelets: 256 10*3/uL (ref 150–400)
RBC: 3.81 MIL/uL — ABNORMAL LOW (ref 3.87–5.11)
RDW: 13.3 % (ref 11.5–15.5)
WBC: 5.4 10*3/uL (ref 4.0–10.5)
nRBC: 0 % (ref 0.0–0.2)

## 2021-08-04 LAB — MAGNESIUM: Magnesium: 2.4 mg/dL (ref 1.7–2.4)

## 2021-08-04 NOTE — Progress Notes (Signed)
Post dose EKG is reviewed SR 71bpm Manually measured QT by me is 465m, QTc is 4760mOK to continue Tikosyn  ReTommye StandardPA-C

## 2021-08-04 NOTE — Progress Notes (Signed)
Pharmacy: Dofetilide (Tikosyn) - Follow Up Assessment and Electrolyte Replacement  Pharmacy consulted to assist in monitoring and replacing electrolytes in this 60 y.o. female admitted on 08/03/2021 undergoing dofetilide initiation.   Labs:    Component Value Date/Time   K 4.2 08/04/2021 0223   MG 2.4 08/04/2021 0223     Plan: Potassium: K >/= 4: No additional supplementation needed  Magnesium: Mg > 2: No additional supplementation needed   Thank you for allowing pharmacy to participate in this patient's care   Hildred Laser, PharmD Clinical Pharmacist **Pharmacist phone directory can now be found on [email or web address] ([email or web address]).  Listed under [place].

## 2021-08-04 NOTE — Care Management (Signed)
1144 08-04-21 Patient presented for Tikosyn load. Case Manager received referral for Tikosyn cost. Benefits check submitted for Tikosyn; Case Manager will follow for cost and pharmacy of choice.

## 2021-08-04 NOTE — Progress Notes (Addendum)
Progress Note  Patient Name: Emily Nunez Date of Encounter: 08/04/2021  [facility] Cardiologist:  AFib clinic notes report referred by Dr. Radford Pax, I don't see any prior cardiologist or EP  Subjective   Very happy to have converted, no complaints   Inpatient Medications    Scheduled Meds:  apixaban  5 mg Oral BID   diltiazem  120 mg Oral BID   dofetilide  500 mcg Oral BID   [START ON 08/05/2021] estradiol  1 patch Transdermal Once per day on Mon Thu   levothyroxine  150 mcg Oral Once per day on Sun Wed Thu Fri Sat   [START ON 08/09/2021] levothyroxine  225 mcg Oral Once per day on Mon Tue   progesterone  100 mg Oral QHS   sodium chloride flush  3 mL Intravenous Q12H   Continuous Infusions:  sodium chloride     PRN Meds: sodium chloride, acetaminophen, sodium chloride flush, zolpidem   Vital Signs    Vitals:   08/03/21 1625 08/03/21 2005 08/04/21 0642  BP: 113/73 118/80 (!) 95/54  Pulse: (!) 102 79 75  Resp: '17 18 18  '$ Temp: 98.4 F (36.9 C) 98.6 F (37 C) 97.9 F (36.6 C)  TempSrc: Oral Oral Oral  SpO2: 96% 97% 97%  Weight: 108.3 kg    Height: '5\' 6"'$  (1.676 m)      Intake/Output Summary (Last 24 hours) at 08/04/2021 0932 Last data filed at 08/04/2021 0500 Gross per 24 hour  Intake 0 ml  Output --  Net 0 ml   Last 3 Weights 08/03/2021 08/03/2021 07/08/2021  Weight (lbs) 238 lb 12.8 oz 240 lb 240 lb 3.2 oz  Weight (kg) 108.319 kg 108.863 kg 108.954 kg      Telemetry    AFib >> SR overnight - Personally Reviewed  ECG    AFib 89bpm,QTc 476m - Personally Reviewed  Physical Exam   GEN: No acute distress.   Neck: No JVD Cardiac: RRR, no murmurs, rubs, or gallops.  Respiratory: CTA b/l GI: Soft, nontender, non-distended  MS: No edema; No deformity. Neuro:  Nonfocal  Psych: Normal affect   Labs    High Sensitivity Troponin:  No results for input(s): TROPONINIHS in the last 720 hours.    Chemistry Recent Labs  Lab 08/03/21 1218 08/03/21 1926  08/04/21 0223  NA 139 138 135  K 3.5 4.3 4.2  CL 105 104 105  CO2 '24 28 24  '$ GLUCOSE 123* 112* 98  BUN 23* 23* 23*  CREATININE 1.11* 1.26* 1.07*  CALCIUM 9.5 9.3 9.0  GFRNONAA 57* 49* 59*  ANIONGAP '10 6 6     '$ Hematology Recent Labs  Lab 08/04/21 0223  WBC 5.4  RBC 3.81*  HGB 11.2*  HCT 34.1*  MCV 89.5  MCH 29.4  MCHC 32.8  RDW 13.3  PLT 256    BNPNo results for input(s): BNP, PROBNP in the last 168 hours.   DDimer No results for input(s): DDIMER in the last 168 hours.   Radiology    No results found.  Cardiac Studies   06/28/21:TEE=   1. The left ventricle has moderately decreased function.   2. Right ventricular systolic function is mildly reduced. The right  ventricular size is normal.   3. Left atrial size was mildly dilated. No left atrial/left atrial  appendage thrombus was detected.   4. The mitral valve is normal in structure. Trivial mitral valve  regurgitation.   5. The aortic valve is tricuspid. Aortic valve  regurgitation is trivial.   6. Agitated saline contrast bubble study was negative, with no evidence  of any interatrial shunt.   Patient Profile     60 y.o. female w/PMHx of hypothyroidism, Afib, admitted for Tikosyn initiation  Assessment & Plan    Persistent Afib CHA2DS2Vasc is 2 female/NICM), on Eliquis, appropriately dosed Tikosyn initiation is in progress K+ 4.2 Mag 2.4 Creat 1.07, stable QTc stable  She has converted with drug  2. Hypothyroid Home replacement  3. CM Suspect NICM 2/2 AF/tachy compensated  For questions or updates, please contact [facility] Please consult [email or web address] for contact info under        Signed, Baldwin Jamaica, PA-C  08/04/2021, 9:32 AM    I have seen and examined this patient with Tommye Standard.  Agree with above, note added to reflect my findings.  On exam, RRR, no murmurs.  Has converted to sinus rhythm on her current dose of Tikosyn.  We Leiya Keesey continue with current management.  QTC is  remained stable.  Azariya Freeman M. Davianna Deutschman MD 08/04/2021 12:01 PM

## 2021-08-04 NOTE — TOC Benefit Eligibility Note (Signed)
Transition of Care [facility]) Benefit Eligibility Note    Patient Details  Name: Emily Nunez MRN: [id number] Date of Birth: 09/13/1961   Medication/Dose: Tikosyn (dofetilide) 569mg,250mcg.1270m., bid for 30 day supply  Covered?: Yes  Tier:  (?)  Prescription Coverage Preferred Pharmacy[facility]  Spoke with Person/Company/Phone Number:: KaOdie Sera/[facility] [employer] PH# [phone]-Pay: ZERO Dollars  Prior Approval: No  Deductible: Met       HaShelda Alteshone Number: 08/04/2021, 1:06 PM

## 2021-08-04 NOTE — Progress Notes (Signed)
  Echocardiogram 2D Echocardiogram has been performed.  Emily Nunez 08/04/2021, 11:16 AM

## 2021-08-04 NOTE — Plan of Care (Signed)

## 2021-08-05 LAB — BASIC METABOLIC PANEL
Anion gap: 6 (ref 5–15)
BUN: 19 mg/dL (ref 6–20)
CO2: 25 mmol/L (ref 22–32)
Calcium: 8.9 mg/dL (ref 8.9–10.3)
Chloride: 106 mmol/L (ref 98–111)
Creatinine, Ser: 0.96 mg/dL (ref 0.44–1.00)
GFR, Estimated: >60 mL/min (ref >60)
Glucose, Bld: 96 mg/dL (ref 70–99)
Potassium: 3.7 mmol/L (ref 3.5–5.1)
Sodium: 137 mmol/L (ref 135–145)

## 2021-08-05 LAB — MAGNESIUM: Magnesium: 2 mg/dL (ref 1.7–2.4)

## 2021-08-05 MED ORDER — POTASSIUM CHLORIDE CRYS ER 20 MEQ PO TBCR
40.0000 meq | EXTENDED_RELEASE_TABLET | Freq: Once | ORAL | Status: AC
  Administered 2021-08-05: 40 meq via ORAL
  Filled 2021-08-05: qty 2

## 2021-08-05 NOTE — Plan of Care (Signed)
  Problem: Clinical Measurements: Goal: Ability to maintain clinical measurements within normal limits will improve 08/05/2021 0543 by Colonel Bald, RN Outcome: Progressing 08/04/2021 2344 by Colonel Bald, RN Outcome: Progressing Goal: Will remain free from infection 08/05/2021 0543 by Colonel Bald, RN Outcome: Progressing 08/04/2021 2344 by Colonel Bald, RN Outcome: Progressing Goal: Diagnostic test results will improve 08/05/2021 0543 by Colonel Bald, RN Outcome: Progressing 08/04/2021 2344 by Colonel Bald, RN Outcome: Progressing Goal: Respiratory complications will improve 08/05/2021 0543 by Colonel Bald, RN Outcome: Progressing 08/04/2021 2344 by Colonel Bald, RN Outcome: Progressing Goal: Cardiovascular complication will be avoided 08/05/2021 0543 by Colonel Bald, RN Outcome: Progressing 08/04/2021 2344 by Colonel Bald, RN Outcome: Progressing

## 2021-08-05 NOTE — Progress Notes (Addendum)
Progress Note  Patient Name: Emily Nunez Date of Encounter: 08/05/2021  [facility] Cardiologist:  AFib clinic notes report referred by Dr. Radford Pax, I don't see any prior cardiologist or EP  Subjective   Sleeping comfortably, easily woken, no complaints, encouraged she is holding SR  Inpatient Medications    Scheduled Meds:  apixaban  5 mg Oral BID   diltiazem  120 mg Oral BID   dofetilide  500 mcg Oral BID   estradiol  1 patch Transdermal Once per day on Mon Thu   levothyroxine  150 mcg Oral Once per day on Sun Wed Thu Fri Sat   [START ON 08/09/2021] levothyroxine  225 mcg Oral Once per day on Mon Tue   potassium chloride  40 mEq Oral Once   progesterone  100 mg Oral QHS   sodium chloride flush  3 mL Intravenous Q12H   Continuous Infusions:  sodium chloride     PRN Meds: sodium chloride, acetaminophen, sodium chloride flush, zolpidem   Vital Signs    Vitals:   08/04/21 1029 08/04/21 1354 08/04/21 2100 08/05/21 0431  BP: 107/74 117/70 124/86 112/73  Pulse:  71 76 76  Resp:  '18 18 18  '$ Temp:  98.5 F (36.9 C) 98.6 F (37 C) 97.9 F (36.6 C)  TempSrc:  Oral Oral Oral  SpO2:   96% 98%  Weight:      Height:        Intake/Output Summary (Last 24 hours) at 08/05/2021 0726 Last data filed at 08/04/2021 2030 Gross per 24 hour  Intake 480 ml  Output --  Net 480 ml   Last 3 Weights 08/03/2021 08/03/2021 07/08/2021  Weight (lbs) 238 lb 12.8 oz 240 lb 240 lb 3.2 oz  Weight (kg) 108.319 kg 108.863 kg 108.954 kg      Telemetry    SR - Personally Reviewed  ECG    SR 71bpm, manually measured QT 427m, QTc 4728m- Personally Reviewed with Dr. CaCurt BearsPhysical Exam   Exam is unchanged GEN: No acute distress.   Neck: No JVD Cardiac: RRR, no murmurs, rubs, or gallops.  Respiratory: CTA b/l GI: Soft, nontender, non-distended  MS: No edema; No deformity. Neuro:  Nonfocal  Psych: Normal affect   Labs    High Sensitivity Troponin:  No results for input(s):  TROPONINIHS in the last 720 hours.    Chemistry Recent Labs  Lab 08/03/21 1926 08/04/21 0223 08/05/21 0212  NA 138 135 137  K 4.3 4.2 3.7  CL 104 105 106  CO2 '28 24 25  '$ GLUCOSE 112* 98 96  BUN 23* 23* 19  CREATININE 1.26* 1.07* 0.96  CALCIUM 9.3 9.0 8.9  GFRNONAA 49* 59* >60  ANIONGAP '6 6 6     '$ Hematology Recent Labs  Lab 08/04/21 0223  WBC 5.4  RBC 3.81*  HGB 11.2*  HCT 34.1*  MCV 89.5  MCH 29.4  MCHC 32.8  RDW 13.3  PLT 256    BNPNo results for input(s): BNP, PROBNP in the last 168 hours.   DDimer No results for input(s): DDIMER in the last 168 hours.   Radiology      Cardiac Studies    08/04/21: TTE IMPRESSIONS   1. Left ventricular ejection fraction, by estimation, is 50 to 55%. The  left ventricle has low normal function. The left ventricle has no regional  wall motion abnormalities. Left ventricular diastolic parameters are  indeterminate.   2. Right ventricular systolic function is normal. The  right ventricular  size is normal. There is normal pulmonary artery systolic pressure. The  estimated right ventricular systolic pressure is 123456 mmHg.   3. The mitral valve is normal in structure. Trivial mitral valve  regurgitation. No evidence of mitral stenosis.   4. The aortic valve is normal in structure. Aortic valve regurgitation is  trivial. No aortic stenosis is present.   5. The inferior vena cava is normal in size with greater than 50%  respiratory variability, suggesting right atrial pressure of 3 mmHg.   06/28/21:TEE=   1. The left ventricle has moderately decreased function.   2. Right ventricular systolic function is mildly reduced. The right  ventricular size is normal.   3. Left atrial size was mildly dilated. No left atrial/left atrial  appendage thrombus was detected.   4. The mitral valve is normal in structure. Trivial mitral valve  regurgitation.   5. The aortic valve is tricuspid. Aortic valve regurgitation is trivial.   6.  Agitated saline contrast bubble study was negative, with no evidence  of any interatrial shunt.   Patient Profile     60 y.o. female w/PMHx of hypothyroidism, Afib, admitted for Tikosyn initiation  Assessment & Plan    Persistent Afib CHA2DS2Vasc is 2 female/NICM), on Eliquis, appropriately dosed Tikosyn initiation is in progress K+ 3.7, replacement ordered Mag 2.0 Creat 0.96, stable QTc stable  Anticipate discharge home tomorrow  2. Hypothyroid Home replacement  3. CM Suspect NICM 2/2 AF/tachy compensated  4.  Obesity: We Sterlin Knightly discussed diet and exercise as an outpatient.  For questions or updates, please contact [place] Please consult [email or web address] for contact info under        Signed, Baldwin Jamaica, PA-C  08/05/2021, 7:26 AM    I have seen and examined this patient with Tommye Standard.  Agree with above, note added to reflect my findings.  On exam, RRR, no murmurs.  She remains in sinus rhythm.  She feels well.  We Shamarra Warda continue with Tikosyn at the current dose.  QTC is remained stable.  Oluchi Pucci M. Ripley Lovecchio MD 08/05/2021 7:35 AM

## 2021-08-05 NOTE — Care Management (Signed)
1652 08-05-21 Case Manager spoke with patient regarding Tikosyn. Patient is aware of her co pay and would like for the first Rx to be sent to the [facility] and refills to be sent to [facility]. No further needs identified at this time.

## 2021-08-05 NOTE — Discharge Summary (Addendum)
ELECTROPHYSIOLOGY PROCEDURE DISCHARGE SUMMARY    Patient ID: Emily Nunez,  MRN: [id number], DOB/AGE: March 04, 1961 60 y.o.  Admit date: 08/03/2021 Discharge date: 08/06/21  Primary Care Physician: Orpah Melter, MD  Primary Cardiologist: none Electrophysiologist: new, Dr. Quentin Ore  Primary Discharge Diagnosis:  1.  persistent atrial fibrillation status post Tikosyn loading this admission      CHA2DS2Vasc is 2, on Eliquis  Secondary Discharge Diagnosis:  Hypothyroid 2. Cardiomyopathy Recovered LVEF by echo done this admission  Allergies  Allergen Reactions   Vibegron Other (See Comments) and Nausea Only   Ciprofloxacin Nausea Only   Macrobid [Nitrofurantoin Monohydrate Macrocrystals] Nausea Only   Septra [Bactrim] Nausea Only   Sertraline Other (See Comments)    Unknown     Procedures This Admission:  1.  Tikosyn loading   Brief HPI: Emily Nunez is a 60 y.o. female with a past medical history as noted above.  They were referred to the AFib clinic in the outpatient setting for treatment options of atrial fibrillation.  Risks, benefits, and alternatives to Tikosyn were reviewed with the patient who wished to proceed.    Hospital Course:  The patient was admitted and Tikosyn was initiated.  Renal function and electrolytes were followed during the hospitalization.  The patient's QTc remained stable.  Maintained SR, PACs.  She converted with drug and did not require DCCV.  She was monitored until discharge on telemetry which demonstrated SR.  On the day of discharge, feels well, was examined by Dr Curt Bears who considered the patient stable for discharge to home.  Follow-up has been arranged with the AFib clinic in 1 week and with Dr Quentin Ore in 4 weeks.   Tikosyn teaching was completed electrolyte replacement for home Davante Gerke be 36mq daily  Physical Exam: Vitals:   08/05/21 1157 08/05/21 2044 08/06/21 0540 08/06/21 0808  BP: (!) 96/50 127/89 112/73 121/86  Pulse: 79 82  71 72  Resp: '18 16 16   '$ Temp: 98.2 F (36.8 C) 98.4 F (36.9 C) 98.1 F (36.7 C) (P) 98.3 F (36.8 C)  TempSrc: Oral Oral Oral (P) Oral  SpO2: 93% 97% 98% 96%  Weight:      Height:         GEN- The patient is well appearing, alert and oriented x 3 today.   HEENT: normocephalic, atraumatic; sclera clear, conjunctiva pink; hearing intact; oropharynx clear; neck supple, no JVP Lymph- no cervical lymphadenopathy Lungs- CTA b/l, normal work of breathing.  No wheezes, rales, rhonchi Heart- RRR, no murmurs, rubs or gallops, PMI not laterally displaced GI- soft, non-tender, non-distended Extremities- no clubbing, cyanosis, or edema MS- no significant deformity or atrophy Skin- warm and dry, no rash or lesion Psych- euthymic mood, full affect Neuro- strength and sensation are intact   Labs:   Lab Results  Component Value Date   WBC 5.4 08/04/2021   HGB 11.2 (L) 08/04/2021   HCT 34.1 (L) 08/04/2021   MCV 89.5 08/04/2021   PLT 256 08/04/2021    Recent Labs  Lab 08/06/21 0236  NA 138  K 3.9  CL 104  CO2 25  BUN 18  CREATININE 1.00  CALCIUM 9.4  GLUCOSE 88     Discharge Medications:  Allergies as of 08/06/2021       Reactions   Vibegron Other (See Comments), Nausea Only   Ciprofloxacin Nausea Only   Macrobid [nitrofurantoin Monohydrate Macrocrystals] Nausea Only   Septra [bactrim] Nausea Only   Sertraline Other (See Comments)  Unknown        Medication List     TAKE these medications    acetaminophen 500 MG tablet Commonly known as: TYLENOL Take 500-1,000 mg by mouth every 6 (six) hours as needed for mild pain or fever.   apixaban 5 MG Tabs tablet Commonly known as: ELIQUIS Take 1 tablet (5 mg total) by mouth 2 (two) times daily.   diltiazem 240 MG 24 hr capsule Commonly known as: Cardizem CD Take 1 capsule (240 mg total) by mouth daily. What changed:  medication strength how much to take when to take this   dofetilide 500 MCG capsule Commonly  known as: TIKOSYN Take 1 capsule (500 mcg total) by mouth 2 (two) times daily.   estradiol 0.075 MG/24HR Commonly known as: VIVELLE-DOT Place 1 patch onto the skin 2 (two) times a week.   levothyroxine 150 MCG tablet Commonly known as: SYNTHROID Take 150-225 mcg by mouth See admin instructions. Take 1.5 tablets by mouth every Monday and Tuesday then take 1 tablet by mouth all other days of week   potassium chloride SA 20 MEQ tablet Commonly known as: KLOR-CON Take 1 tablet (20 mEq total) by mouth daily. What changed:  how much to take when to take this   progesterone 100 MG capsule Commonly known as: PROMETRIUM Take 1 capsule (100 mg total) by mouth at bedtime.   Vitamin D (Ergocalciferol) 1.25 MG (50000 UNIT) Caps capsule Commonly known as: DRISDOL Take 50,000 Units by mouth every Sunday.   zolpidem 10 MG tablet Commonly known as: AMBIEN Take 10 mg by mouth at bedtime as needed for sleep.        Disposition: Home Discharge Instructions     Diet - low sodium heart healthy   Complete by: As directed    Increase activity slowly   Complete by: As directed        Follow-up Information     [facility].   Specialty: Cardiology Why: 08/13/21 @ 10:30Am with R. Marlene Lard, PA-C Contact information: [address] [id number] [place] [phone]        Vickie Epley, MD Follow up.   Specialties: Cardiology, Radiology Why: 09/08/21 @ 2:30PM Contact information: [place] [address] [phone]                 Duration of Discharge Encounter: Greater than 30 minutes including physician time.  Signed, Tommye Standard, PA-C 08/06/2021 11:59 AM  I have seen and examined this patient with Tommye Standard.  Agree with above, note added to reflect my findings.  On exam, RRR, no murmurs.  Patient admitted for dofetilide loading.  QTC is remained stable.  She converted  after her initial dose of dofetilide.  She is feeling well.  Plan for discharge today with follow-up in clinic.  Usman Millett M. Vicenta Olds MD 08/06/2021 12:43 PM

## 2021-08-05 NOTE — Progress Notes (Addendum)
Pt's Qtc was 506 after evening dose of Tikosyn given. Gaynelle Arabian, MD made aware. Pt's QT was prolonged, consulted pharmacist that advised this RN to hold Ambien dose for tonight. Will continue to monitor.   Elaina Hoops, RN

## 2021-08-06 ENCOUNTER — Other Ambulatory Visit (HOSPITAL_COMMUNITY): Payer: Self-pay

## 2021-08-06 LAB — BASIC METABOLIC PANEL
Anion gap: 9 (ref 5–15)
BUN: 18 mg/dL (ref 6–20)
CO2: 25 mmol/L (ref 22–32)
Calcium: 9.4 mg/dL (ref 8.9–10.3)
Chloride: 104 mmol/L (ref 98–111)
Creatinine, Ser: 1 mg/dL (ref 0.44–1.00)
GFR, Estimated: >60 mL/min (ref >60)
Glucose, Bld: 88 mg/dL (ref 70–99)
Potassium: 3.9 mmol/L (ref 3.5–5.1)
Sodium: 138 mmol/L (ref 135–145)

## 2021-08-06 LAB — MAGNESIUM: Magnesium: 1.9 mg/dL (ref 1.7–2.4)

## 2021-08-06 MED ORDER — DILTIAZEM HCL ER COATED BEADS 240 MG PO CP24
240.0000 mg | ORAL_CAPSULE | Freq: Every day | ORAL | 6 refills | Status: DC
  Filled 2021-08-06: qty 30, 30d supply, fill #0

## 2021-08-06 MED ORDER — POTASSIUM CHLORIDE CRYS ER 20 MEQ PO TBCR
20.0000 meq | EXTENDED_RELEASE_TABLET | Freq: Every day | ORAL | 3 refills | Status: DC
  Filled 2021-08-06: qty 30, 30d supply, fill #0

## 2021-08-06 MED ORDER — DILTIAZEM HCL ER COATED BEADS 240 MG PO CP24: 240.0000 mg | ORAL_CAPSULE | Freq: Every day | ORAL | 6 refills | Status: DC

## 2021-08-06 MED ORDER — POTASSIUM CHLORIDE CRYS ER 20 MEQ PO TBCR
40.0000 meq | EXTENDED_RELEASE_TABLET | Freq: Once | ORAL | Status: AC
  Administered 2021-08-06: 40 meq via ORAL
  Filled 2021-08-06: qty 2

## 2021-08-06 MED ORDER — DOFETILIDE 500 MCG PO CAPS
500.0000 ug | ORAL_CAPSULE | Freq: Two times a day (BID) | ORAL | 6 refills | Status: DC
  Filled 2021-08-06: qty 60, 30d supply, fill #0

## 2021-08-06 MED ORDER — MAGNESIUM SULFATE 2 GM/50ML IV SOLN
2.0000 g | Freq: Once | INTRAVENOUS | Status: AC
  Administered 2021-08-06: 2 g via INTRAVENOUS
  Filled 2021-08-06: qty 50

## 2021-08-06 NOTE — Progress Notes (Signed)
Pharmacy: Dofetilide (Tikosyn) - Follow Up Assessment and Electrolyte Replacement  Pharmacy consulted to assist in monitoring and replacing electrolytes in this 60 y.o. female admitted on 08/03/2021 undergoing dofetilide initiation.   Labs:    Component Value Date/Time   K 3.9 08/06/2021 0236   MG 1.9 08/06/2021 0236     Plan: Potassium: -Kdur 72mq x1 per MD  Magnesium: -Mg 2gm IV per MD   As patient has required on average 35 mEq of potassium replacement every day, recommend discharging patient with prescription for:  Potassium chloride 30 mEq  daily  Thank you for allowing pharmacy to participate in this patient's care   AHildred Laser PharmD Clinical Pharmacist **Pharmacist phone directory can now be found on [facility] (PW [id number]).  Listed under [place]

## 2021-08-06 NOTE — Progress Notes (Signed)
EKG is reviewed with Dr. Curt Bears, QT measured shorter then machine read, and OK for this morning's dose 5107mg Tikosyn. Telemetry reviewed as well, SR PCAs  If QT remains stable anticipate discharge this afternoon

## 2021-08-09 ENCOUNTER — Encounter (INDEPENDENT_AMBULATORY_CARE_PROVIDER_SITE_OTHER): Payer: Self-pay

## 2021-08-13 ENCOUNTER — Ambulatory Visit (HOSPITAL_COMMUNITY): Payer: BC Managed Care – PPO | Admitting: Physician Assistant

## 2021-08-13 ENCOUNTER — Encounter (HOSPITAL_COMMUNITY): Payer: Self-pay | Admitting: Physician Assistant

## 2021-08-13 ENCOUNTER — Ambulatory Visit (HOSPITAL_COMMUNITY)
Admission: RE | Admit: 2021-08-13 | Discharge: 2021-08-13 | Disposition: A | Discharge status: Home / Self Care | Payer: BC Managed Care – PPO | Source: Ambulatory Visit | Attending: Physician Assistant | Admitting: Physician Assistant

## 2021-08-13 ENCOUNTER — Other Ambulatory Visit: Payer: Self-pay

## 2021-08-13 VITALS — BP 114/68 | HR 81 | Ht 66.0 in | Wt 241.4 lb

## 2021-08-13 DIAGNOSIS — Z79890 Hormone replacement therapy (postmenopausal): Secondary | ICD-10-CM | POA: Diagnosis not present

## 2021-08-13 DIAGNOSIS — Z79899 Other long term (current) drug therapy: Secondary | ICD-10-CM | POA: Diagnosis not present

## 2021-08-13 DIAGNOSIS — I4819 Other persistent atrial fibrillation: Secondary | ICD-10-CM | POA: Diagnosis present

## 2021-08-13 DIAGNOSIS — Z7901 Long term (current) use of anticoagulants: Secondary | ICD-10-CM | POA: Diagnosis not present

## 2021-08-13 DIAGNOSIS — Z888 Allergy status to other drugs, medicaments and biological substances status: Secondary | ICD-10-CM | POA: Insufficient documentation

## 2021-08-13 DIAGNOSIS — Z881 Allergy status to other antibiotic agents status: Secondary | ICD-10-CM | POA: Diagnosis not present

## 2021-08-13 LAB — BASIC METABOLIC PANEL
Anion gap: 10 (ref 5–15)
BUN: 18 mg/dL (ref 6–20)
CO2: 23 mmol/L (ref 22–32)
Calcium: 9.4 mg/dL (ref 8.9–10.3)
Chloride: 105 mmol/L (ref 98–111)
Creatinine, Ser: 1.03 mg/dL — ABNORMAL HIGH (ref 0.44–1.00)
GFR, Estimated: >60 mL/min (ref >60)
Glucose, Bld: 127 mg/dL — ABNORMAL HIGH (ref 70–99)
Potassium: 3.9 mmol/L (ref 3.5–5.1)
Sodium: 138 mmol/L (ref 135–145)

## 2021-08-13 LAB — MAGNESIUM: Magnesium: 1.9 mg/dL (ref 1.7–2.4)

## 2021-08-13 MED ORDER — DOFETILIDE 500 MCG PO CAPS: 500.0000 ug | ORAL_CAPSULE | Freq: Two times a day (BID) | ORAL | 1 refills | Status: DC

## 2021-08-13 NOTE — Progress Notes (Signed)
Primary Care Physician: Orpah Melter, MD Referring Physician: Dr. Radford Pax  Primary EP: Dr Jacklynn Barnacle is a 60 y.o. female that is in the afib clinic for f/u of ER visit, 05/17/21, where pt was evaluated after finding of new onset afib at time of bladder suspension surgery on the same day. She was unaware of elevated HR's. Onset of afib unknown. She was started on metoprolol 50 mg bid and eliquis 5 mg bid. She was d/c in rate controlled afib.   In the afib clinic, 05/20/21, pt is still  unaware of afib. She has RVR today at 110 bpm. She has been taking the 50 mg  bid of metoprolol. First full day of eliquis 5 mg bid was yesterday, 6/22. No alcohol, caffeine, tobacco or snoring history, no regular exercise program.     F/u in afib clinic 8/11. She had a successful cardioversion but unfortunately went back into afib maybe yesterday.  She has RVR today. She had moderately reduced EF by TEE and Dr. Harrington Challenger also talked to her that she needed a sleep study. Referred today. She did get her second shingles shot Tuesday.    F/u in afib clinic, 08/03/21 for admission for Tikosyn. She remains in afib with RVR. She denies any benadryl use. No missed eliquis dose for at 3 weeks. She tolerates afib well but will need an echo several weeks after resuming SR to see if EF did return to normal. Could possibly be an ablation candidate down the line.   Follow up in the AF clinic 08/13/21. Patient is s/p dofetilide loading 9/6-08/06/21. She converted with the medication and did not require DCCV. Patient reports that she feels well today, no heart racing or palpitations. She denies any bleeding issues on anticoagulation.   Today, she denies symptoms of palpitations, chest pain, shortness of breath, orthopnea, PND, lower extremity edema, dizziness, presyncope, syncope, or neurologic sequela. The patient is tolerating medications without difficulties and is otherwise without complaint today.   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   Hypothyroid    Urinary incontinence    Vitamin D deficiency 09/2010   VIT D = 22   Past Surgical History:  Procedure Laterality Date   BASAL CELL CARCINOMA EXCISION     BREAST LUMPECTOMY WITH RADIOACTIVE SEED LOCALIZATION Right 06/05/2019   Procedure: RIGHT BREAST LUMPECTOMY WITH RADIOACTIVE SEED LOCALIZATION;  Surgeon: Fanny Skates, MD;  Location: [place] [facility];  Service: General;  Laterality: Right;   BUBBLE STUDY  06/28/2021   Procedure: BUBBLE STUDY;  Surgeon: Fay Records, MD;  Location: [facility];  Service: Cardiovascular;;   CARDIOVERSION N/A 06/28/2021   Procedure: CARDIOVERSION;  Surgeon: Fay Records, MD;  Location: [place];  Service: Cardiovascular;  Laterality: N/A;   COLONOSCOPY     [place] N/A 06/25/2019   Procedure: [place];  Surgeon: Anastasio Auerbach, MD;  Location: [facility];  Service: Gynecology;  Laterality: N/A;   ENDOMETRIAL ABLATION  2005   HYDROTHERMAL ABLATION   PELVIC LAPAROSCOPY  04/2004   ENDO/CYST   TEE WITHOUT CARDIOVERSION N/A 06/28/2021   Procedure: TRANSESOPHAGEAL ECHOCARDIOGRAM (TEE);  Surgeon: Fay Records, MD;  Location: [facility] ENDOSCOPY;  Service: Cardiovascular;  Laterality: N/A;    Current Outpatient Medications  Medication  Sig Dispense Refill   acetaminophen (TYLENOL) 500 MG tablet Take 500-1,000 mg by mouth every 6 (six) hours as needed for mild pain or fever.     apixaban (ELIQUIS) 5 MG TABS tablet Take 1 tablet (5 mg total) by mouth 2 (two) times daily. 60 tablet 3   diltiazem (CARTIA XT) 240 MG 24 hr capsule Take 1 capsule (240 mg total) by mouth daily. 30 capsule 6   dofetilide (TIKOSYN) 500 MCG capsule Take 1 capsule (500 mcg total) by mouth 2 (two) times daily. 60 capsule  6   estradiol (VIVELLE-DOT) 0.075 MG/24HR Place 1 patch onto the skin 2 (two) times a week. 24 patch 1   levothyroxine (SYNTHROID) 150 MCG tablet Take 150-225 mcg by mouth See admin instructions. Take 1.5 tablets by mouth every Monday and Tuesday then take 1 tablet by mouth all other days of week     potassium chloride SA (KLOR-CON) 20 MEQ tablet Take 1 tablet (20 mEq total) by mouth daily. 30 tablet 3   progesterone (PROMETRIUM) 100 MG capsule Take 1 capsule (100 mg total) by mouth at bedtime. 90 capsule 1   Vitamin D, Ergocalciferol, (DRISDOL) 1.25 MG (50000 UNIT) CAPS capsule Take 50,000 Units by mouth every Sunday.     zolpidem (AMBIEN) 10 MG tablet Take 10 mg by mouth at bedtime as needed for sleep.     No current facility-administered medications for this visit.    Allergies  Allergen Reactions   Vibegron Other (See Comments) and Nausea Only   Ciprofloxacin Nausea Only   Macrobid [Nitrofurantoin Monohydrate Macrocrystals] Nausea Only   Septra [Bactrim] Nausea Only   Sertraline Other (See Comments)    Unknown    Social History   Socioeconomic History   Marital status: Married    Spouse name: Not on file   Number of children: Not on file   Years of education: Not on file   Highest education level: Not on file  Occupational History   Not on file  Tobacco Use   Smoking status: Never   Smokeless tobacco: Never  Vaping Use   Vaping Use: Never used  Substance and Sexual Activity   Alcohol use: Yes    Comment: social   Drug use: No   Sexual activity: Yes    Partners: Male    Birth control/protection: Surgical    Comment:  VASECTOMY-1st intercourse 60 yo-Fewer than 5 partners  Other Topics Concern   Not on file  Social History Narrative   Not on file   Social Determinants of Health   Financial Resource Strain: Not on file  Food Insecurity: Not on file  Transportation Needs: Not on file  Physical Activity: Not on file  Stress: Not on file  Social Connections: Not  on file  Intimate Partner Violence: Not on file    Family History  Problem Relation Age of Onset   Cancer Mother        COLON   Heart disease Father     ROS- All systems are reviewed and negative except as per the HPI above  Physical Exam: There were no vitals filed for this visit.  Wt Readings from Last 3 Encounters:  08/03/21 108.3 kg  08/03/21 108.9 kg  07/08/21 109 kg    Labs: Lab Results  Component Value Date   NA 138 08/06/2021   K 3.9 08/06/2021   CL 104 08/06/2021   CO2 25 08/06/2021   GLUCOSE 88 08/06/2021   BUN 18 08/06/2021   CREATININE  1.00 08/06/2021   CALCIUM 9.4 08/06/2021   MG 1.9 08/06/2021   Lab Results  Component Value Date   INR 0.9 05/17/2021   No results found for: CHOL, HDL, LDLCALC, TRIG  GEN- The patient is a well appearing obese female, alert and oriented x 3 today.   HEENT-head normocephalic, atraumatic, sclera clear, conjunctiva pink, hearing intact, trachea midline. Lungs- Clear to ausculation bilaterally, normal work of breathing Heart- Regular rate and rhythm, no murmurs, rubs or gallops  GI- soft, NT, ND, + BS Extremities- no clubbing, cyanosis, or edema MS- no significant deformity or atrophy Skin- no rash or lesion Psych- euthymic mood, full affect Neuro- strength and sensation are intact   EKG-SR Vent. rate 81 BPM PR interval 166 ms QRS duration 96 ms QT/QTcB 416/483 ms   TEE=  1. The left ventricle has moderately decreased function.   2. Right ventricular systolic function is mildly reduced. The right  ventricular size is normal.   3. Left atrial size was mildly dilated. No left atrial/left atrial  appendage thrombus was detected.   4. The mitral valve is normal in structure. Trivial mitral valve  regurgitation.   5. The aortic valve is tricuspid. Aortic valve regurgitation is trivial.   6. Agitated saline contrast bubble study was negative, with no evidence  of any interatrial shunt.    Assessment and Plan:   1. Persistent atrial fibrillation  S/p dofetilide loading 9/6-08/06/21 Patient appears to be maintaining SR. Continue dofetilide 500 mcg BID. QT stable. Check bmet/mag today. Continue diltiazem 240 mg daily Continue Eliquis 5 mg BID  2. CHA2DS2VASc of 1 Eliquis 5 mg BID     Follow up with Dr Quentin Ore as scheduled.    [facility] [address] [place], [place] [address] [phone]

## 2021-08-31 ENCOUNTER — Other Ambulatory Visit (HOSPITAL_COMMUNITY): Payer: Self-pay | Admitting: Nurse Practitioner

## 2021-09-02 ENCOUNTER — Other Ambulatory Visit: Payer: Self-pay

## 2021-09-02 ENCOUNTER — Ambulatory Visit
Admission: RE | Admit: 2021-09-02 | Discharge: 2021-09-02 | Disposition: A | Discharge status: Home / Self Care | Payer: BC Managed Care – PPO | Source: Ambulatory Visit | Attending: Family Medicine | Admitting: Family Medicine

## 2021-09-02 DIAGNOSIS — Z1231 Encounter for screening mammogram for malignant neoplasm of breast: Secondary | ICD-10-CM

## 2021-09-03 ENCOUNTER — Other Ambulatory Visit: Payer: Self-pay | Admitting: Internal Medicine

## 2021-09-03 ENCOUNTER — Encounter (HOSPITAL_BASED_OUTPATIENT_CLINIC_OR_DEPARTMENT_OTHER): Payer: BC Managed Care – PPO | Admitting: Cardiovascular Disease

## 2021-09-03 MED ORDER — DOFETILIDE 500 MCG PO CAPS: 500.0000 ug | ORAL_CAPSULE | Freq: Two times a day (BID) | ORAL | 3 refills | Status: DC

## 2021-09-03 MED ORDER — POTASSIUM CHLORIDE CRYS ER 20 MEQ PO TBCR: 20.0000 meq | EXTENDED_RELEASE_TABLET | Freq: Every day | ORAL | 3 refills | Status: DC

## 2021-09-03 NOTE — Progress Notes (Signed)
Refilled tikosyn/potassium

## 2021-09-06 ENCOUNTER — Other Ambulatory Visit (HOSPITAL_COMMUNITY): Payer: Self-pay

## 2021-09-08 ENCOUNTER — Ambulatory Visit: Payer: BC Managed Care – PPO | Admitting: Cardiology

## 2021-09-23 ENCOUNTER — Encounter: Payer: Self-pay | Admitting: Cardiology

## 2021-09-23 ENCOUNTER — Ambulatory Visit: Payer: BC Managed Care – PPO | Admitting: Cardiology

## 2021-09-23 ENCOUNTER — Other Ambulatory Visit: Payer: Self-pay

## 2021-09-23 VITALS — BP 140/86 | HR 75 | Wt 240.8 lb

## 2021-09-23 DIAGNOSIS — Z79899 Other long term (current) drug therapy: Secondary | ICD-10-CM | POA: Diagnosis not present

## 2021-09-23 DIAGNOSIS — I4819 Other persistent atrial fibrillation: Secondary | ICD-10-CM

## 2021-09-23 LAB — BASIC METABOLIC PANEL
BUN/Creatinine Ratio: 19 (ref 12–28)
BUN: 18 mg/dL (ref 8–27)
CO2: 25 mmol/L (ref 20–29)
Calcium: 9.5 mg/dL (ref 8.7–10.3)
Chloride: 105 mmol/L (ref 96–106)
Creatinine, Ser: 0.96 mg/dL (ref 0.57–1.00)
Glucose: 86 mg/dL (ref 70–99)
Potassium: 4.5 mmol/L (ref 3.5–5.2)
Sodium: 141 mmol/L (ref 134–144)
eGFR: 68 mL/min/{1.73_m2} (ref >59)

## 2021-09-23 LAB — MAGNESIUM: Magnesium: 1.9 mg/dL (ref 1.6–2.3)

## 2021-09-23 MED ORDER — POTASSIUM CHLORIDE CRYS ER 20 MEQ PO TBCR: 20.0000 meq | EXTENDED_RELEASE_TABLET | Freq: Every day | ORAL | 3 refills | Status: DC

## 2021-09-23 NOTE — Patient Instructions (Addendum)
Medication Instructions:  Your physician recommends that you continue on your current medications as directed. Please refer to the Current Medication list given to you today. *If you need a refill on your cardiac medications before your next appointment, please call your pharmacy*  Lab Work: You will get lab work today:  BMP and mag If you have labs (blood work) drawn today and your tests are completely normal, you will receive your results only by: MyChart Message (if you have MyChart) OR A paper copy in the mail If you have any lab test that is abnormal or we need to change your treatment, we will call you to review the results.  Testing/Procedures: None ordered.  Follow-Up: At [facility], you and your health needs are our priority.  As part of our continuing mission to provide you with exceptional heart care, we have created designated Provider Care Teams.  These Care Teams include your primary Cardiologist (physician) and Advanced Practice Providers (APPs -  Physician Assistants and Nurse Practitioners) who all work together to provide you with the care you need, when you need it.  Your next appointment:   Your physician wants you to follow-up in: 3 months with one of the following Advanced Practice Providers on your designated Care Team:   Tommye Standard, [place] Legrand Como "[facility]" [place], [place]

## 2021-09-23 NOTE — Progress Notes (Signed)
Electrophysiology Office Follow up Visit Note:    Date:  09/23/2021   ID:  Emily Nunez, DOB 1961-05-16, MRN [id number]  PCP:  Orpah Melter, MD  [facility] Cardiologist:  None  [facility] Electrophysiologist:  Vickie Epley, MD    Interval History:    Emily Nunez is a 60 y.o. female who presents for a follow up visit after being admitted in September for dofetilide initiation.  She did well with Tikosyn in the hospital and was discharged on 500 mcg by mouth twice daily.  She is on Eliquis for stroke prophylaxis.  She has done well since starting Tikosyn without recurrence of atrial fibrillation.        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   Hypothyroid    Urinary incontinence    Vitamin D deficiency 09/2010   VIT D = 22    Past Surgical History:  Procedure Laterality Date   BASAL CELL CARCINOMA EXCISION     BREAST LUMPECTOMY WITH RADIOACTIVE SEED LOCALIZATION Right 06/05/2019   Procedure: RIGHT BREAST LUMPECTOMY WITH RADIOACTIVE SEED LOCALIZATION;  Surgeon: Fanny Skates, MD;  Location: [place];  Service: General;  Laterality: Right;   BUBBLE STUDY  06/28/2021   Procedure: BUBBLE STUDY;  Surgeon: Fay Records, MD;  Location: [place];  Service: Cardiovascular;;   CARDIOVERSION N/A 06/28/2021   Procedure: CARDIOVERSION;  Surgeon: Fay Records, MD;  Location: [place];  Service: Cardiovascular;  Laterality: N/A;   COLONOSCOPY     [place] N/A 06/25/2019   Procedure: [place];  Surgeon: Anastasio Auerbach, MD;  Location: [place];  Service: Gynecology;  Laterality: N/A;   ENDOMETRIAL ABLATION  2005   HYDROTHERMAL ABLATION   PELVIC LAPAROSCOPY  04/2004   ENDO/CYST   TEE WITHOUT CARDIOVERSION N/A  06/28/2021   Procedure: TRANSESOPHAGEAL ECHOCARDIOGRAM (TEE);  Surgeon: Fay Records, MD;  Location: [facility] ENDOSCOPY;  Service: Cardiovascular;  Laterality: N/A;    Current Medications: Current Meds  Medication Sig   acetaminophen (TYLENOL) 500 MG tablet Take 500-1,000 mg by mouth every 6 (six) hours as needed for mild pain or fever.   diltiazem (CARTIA XT) 240 MG 24 hr capsule Take 1 capsule (240 mg total) by mouth daily.   dofetilide (TIKOSYN) 500 MCG capsule Take 1 capsule (500 mcg total) by mouth 2 (two) times daily.   estradiol (VIVELLE-DOT) 0.075 MG/24HR Place 1 patch onto the skin 2 (two) times a week.   levothyroxine (SYNTHROID) 150 MCG tablet Take 150-225 mcg by mouth See admin instructions. Take 1.5 tablets by mouth every Monday and Tuesday then take 1 tablet by mouth all other days of week   medroxyPROGESTERone (PROVERA) 10 MG tablet medroxyprogesterone 10 mg tablet   progesterone (PROMETRIUM) 100 MG capsule Take 1 capsule (100 mg total) by mouth at bedtime.   Vitamin D, Ergocalciferol, (DRISDOL) 1.25 MG (50000 UNIT) CAPS capsule Take 50,000 Units by mouth every Sunday.   zolpidem (AMBIEN) 10 MG tablet Take 10 mg by mouth at bedtime as needed for sleep.   [DISCONTINUED] potassium chloride SA (KLOR-CON) 20 MEQ tablet Take 1 tablet (20 mEq total) by mouth daily.     Allergies:   Vibegron, Ciprofloxacin, Macrobid [nitrofurantoin monohydrate macrocrystals], Septra [bactrim], and Sertraline   Social History  Socioeconomic History   Marital status: Married    Spouse name: Not on file   Number of children: Not on file   Years of education: Not on file   Highest education level: Not on file  Occupational History   Not on file  Tobacco Use   Smoking status: Never   Smokeless tobacco: Never  Vaping Use   Vaping Use: Never used  Substance and Sexual Activity   Alcohol use: Yes    Comment: social   Drug use: No   Sexual activity: Yes    Partners: Male    Birth  control/protection: Surgical    Comment:  VASECTOMY-1st intercourse 60 yo-Fewer than 5 partners  Other Topics Concern   Not on file  Social History Narrative   Not on file   Social Determinants of Health   Financial Resource Strain: Not on file  Food Insecurity: Not on file  Transportation Needs: Not on file  Physical Activity: Not on file  Stress: Not on file  Social Connections: Not on file     Family History: The patient's family history includes Cancer in her mother; Heart disease in her father.  ROS:   Please see the history of present illness.    All other systems reviewed and are negative.  EKGs/Labs/Other Studies Reviewed:    The following studies were reviewed today:   EKG:  The ekg ordered today demonstrates sinus rhythm.  QTC is 444 ms.  Recent Labs: 05/17/2021: TSH 0.331 08/04/2021: Hemoglobin 11.2; Platelets 256 08/13/2021: BUN 18; Creatinine, Ser 1.03; Magnesium 1.9; Potassium 3.9; Sodium 138  Recent Lipid Panel No results found for: CHOL, TRIG, HDL, CHOLHDL, VLDL, LDLCALC, LDLDIRECT  Physical Exam:    VS:  BP 140/86   Pulse 75   Wt 240 lb 12.8 oz (109.2 kg)   SpO2 94%   BMI 38.87 kg/m     Wt Readings from Last 3 Encounters:  09/23/21 240 lb 12.8 oz (109.2 kg)  08/13/21 241 lb 6.4 oz (109.5 kg)  08/03/21 238 lb 12.8 oz (108.3 kg)     GEN:  Well nourished, well developed in no acute distress HEENT: Normal NECK: No JVD; No carotid bruits LYMPHATICS: No lymphadenopathy CARDIAC: RRR, no murmurs, rubs, gallops RESPIRATORY:  Clear to auscultation without rales, wheezing or rhonchi  ABDOMEN: Soft, non-tender, non-distended MUSCULOSKELETAL:  No edema; No deformity  SKIN: Warm and dry NEUROLOGIC:  Alert and oriented x 3 PSYCHIATRIC:  Normal affect        ASSESSMENT:    1. Persistent atrial fibrillation ([place])   2. Encounter for long-term (current) use of high-risk medication    PLAN:    In order of problems listed above:  #Persistent  atrial fibrillation Maintaining sinus rhythm on dofetilide.  Continue Eliquis for stroke prophylaxis.  I will refill her potassium today and check her electrolytes.  Follow-up in 3 months with APP in 6 months with me.  #Dofetilide monitoring QTC acceptable today.  Repeat lab work today.     Follow-up in 3 months with app.  Follow-up with me in 6 months.     Medication Adjustments/Labs and Tests Ordered: Current medicines are reviewed at length with the patient today.  Concerns regarding medicines are outlined above.  Orders Placed This Encounter  Procedures   Basic Metabolic Panel (BMET)   Magnesium   EKG 12-Lead   Meds ordered this encounter  Medications   potassium chloride SA (KLOR-CON) 20 MEQ tablet    Sig: Take 1 tablet (20 mEq  total) by mouth daily.    Dispense:  90 tablet    Refill:  3     Signed, Lars Mage, MD, [facility], [facility] 09/23/2021 10:10 AM    Electrophysiology [facility]

## 2021-09-30 ENCOUNTER — Other Ambulatory Visit (HOSPITAL_COMMUNITY): Payer: Self-pay | Admitting: Nurse Practitioner

## 2021-09-30 NOTE — Telephone Encounter (Signed)
Prescription refill request for Eliquis received. Indication: Afib  Last office visit:09/23/21 Quentin Ore)  Scr:0.96 (09/23/21) Age: 60 Weight: 109.2kg  Appropriate dose and refill sent to requested pharmacy.

## 2021-10-01 ENCOUNTER — Ambulatory Visit (INDEPENDENT_AMBULATORY_CARE_PROVIDER_SITE_OTHER): Payer: BC Managed Care – PPO | Admitting: Obstetrics & Gynecology

## 2021-10-01 ENCOUNTER — Encounter: Payer: Self-pay | Admitting: Obstetrics & Gynecology

## 2021-10-01 ENCOUNTER — Other Ambulatory Visit: Payer: Self-pay

## 2021-10-01 ENCOUNTER — Other Ambulatory Visit (HOSPITAL_COMMUNITY)
Admission: RE | Admit: 2021-10-01 | Discharge: 2021-10-01 | Disposition: A | Discharge status: Home / Self Care | Payer: BC Managed Care – PPO | Source: Ambulatory Visit | Attending: Obstetrics & Gynecology | Admitting: Obstetrics & Gynecology

## 2021-10-01 VITALS — BP 110/72 | HR 107 | Resp 16 | Ht 64.75 in | Wt 239.0 lb

## 2021-10-01 DIAGNOSIS — Z6841 Body Mass Index (BMI) 40.0 and over, adult: Secondary | ICD-10-CM

## 2021-10-01 DIAGNOSIS — N950 Postmenopausal bleeding: Secondary | ICD-10-CM | POA: Diagnosis not present

## 2021-10-01 DIAGNOSIS — Z79890 Hormone replacement therapy (postmenopausal): Secondary | ICD-10-CM | POA: Diagnosis not present

## 2021-10-01 DIAGNOSIS — Z01419 Encounter for gynecological examination (general) (routine) without abnormal findings: Secondary | ICD-10-CM | POA: Insufficient documentation

## 2021-10-01 MED ORDER — ESTRADIOL 0.05 MG/24HR TD PTTW: 1.0000 | MEDICATED_PATCH | TRANSDERMAL | 4 refills | Status: DC

## 2021-10-01 NOTE — Progress Notes (Signed)
Emily Nunez 04-Jun-1961 [id number]   History:    60 y.o. G3P3L3  RP:  Established patient presenting for annual gyn exam   HPI:  Postmenopausal, well on HRT with Estradiol patch 0075 twice a week and Prometrium 100 mg PO HS.  Had mild PMB before starting on HRT and currently persisting after 2 1/2 months back on HRT.  No pelvic pain.  Abstinent currently.  Pap smear Neg in 10/2018.  Breasts normal.  Health labs with Fam MD.  Colonoscopy 2013.  Past medical history,surgical history, family history and social history were all reviewed and documented in the EPIC chart.  Gynecologic History No LMP recorded. Patient has had an ablation.  Obstetric History OB History  Gravida Para Term Preterm AB Living  3 3 3     3   SAB IAB Ectopic Multiple Live Births          3    # Outcome Date GA Lbr Len/2nd Weight Sex Delivery Anes PTL Lv  3 Term     F Vag-Spont  N LIV  2 Term     F Vag-Spont  N LIV  1 Term     F Vag-Spont  N LIV     ROS: A ROS was performed and pertinent positives and negatives are included in the history.  GENERAL: No fevers or chills. HEENT: No change in vision, no earache, sore throat or sinus congestion. NECK: No pain or stiffness. CARDIOVASCULAR: No chest pain or pressure. No palpitations. PULMONARY: No shortness of breath, cough or wheeze. GASTROINTESTINAL: No abdominal pain, nausea, vomiting or diarrhea, melena or bright red blood per rectum. GENITOURINARY: No urinary frequency, urgency, hesitancy or dysuria. MUSCULOSKELETAL: No joint or muscle pain, no back pain, no recent trauma. DERMATOLOGIC: No rash, no itching, no lesions. ENDOCRINE: No polyuria, polydipsia, no heat or cold intolerance. No recent change in weight. HEMATOLOGICAL: No anemia or easy bruising or bleeding. NEUROLOGIC: No headache, seizures, numbness, tingling or weakness. PSYCHIATRIC: No depression, no loss of interest in normal activity or change in sleep pattern.     Exam:   BP 110/72   Pulse (!)  107   Resp 16   Ht 5' 4.75" (1.645 m)   Wt 239 lb (108.4 kg)   BMI 40.08 kg/m   Body mass index is 40.08 kg/m.  General appearance : Well developed well nourished female. No acute distress HEENT: Eyes: no retinal hemorrhage or exudates,  Neck supple, trachea midline, no carotid bruits, no thyroidmegaly Lungs: Clear to auscultation, no rhonchi or wheezes, or rib retractions  Heart: Regular rate and rhythm, no murmurs or gallops Breast:Examined in sitting and supine position were symmetrical in appearance, no palpable masses or tenderness,  no skin retraction, no nipple inversion, no nipple discharge, no skin discoloration, no axillary or supraclavicular lymphadenopathy Abdomen: no palpable masses or tenderness, no rebound or guarding Extremities: no edema or skin discoloration or tenderness  Pelvic: Vulva: Normal             Vagina: No gross lesions or discharge  Cervix: No gross lesions or discharge. Mild dark blood coming out at the EO.  Pap reflex done.  Uterus  AV, normal size, shape and consistency, non-tender and mobile  Adnexa  Without masses or tenderness  Anus: Normal   Assessment/Plan:  60 y.o. female for annual exam   1. Encounter for routine gynecological examination with Papanicolaou smear of cervix Normal gynecologic exam except for postmenopausal bleeding.  Pap reflex done.  Breast  exam normal.  Screening mammogram negative in October 2022.  Colonoscopy in 2013.  Health labs with family physician. - Cytology - PAP( [facility])  2. Postmenopausal hormone replacement therapy Well on the hormone replacement therapy but continued mild postmenopausal bleeding.  No contraindication otherwise to continue on hormone replacement therapy.  Decision to decrease the estradiol to a patch of 0.05 twice a week.  Continue Prometrium 100 mg per mouth at bedtime.  Estradiol prescription sent to pharmacy.  3. Postmenopausal bleeding Normal gynecologic exam today.  We will further  investigate with a pelvic ultrasound at follow-up to evaluate the endometrium and rule out pathology at that level including endometrial hyperplasia and cancer. - US Transvaginal Non-OB; Future  4. Class 3 severe obesity due to excess calories with serious comorbidity and body mass index (BMI) of 40.0 to 44.9 in adult [facility]) Recommend a lower calorie/carb diet.  Aerobic activities 5 times a week and light weightlifting every 2 days.  Refer to the [facility].  Other orders - estradiol (VIVELLE-DOT) 0.05 MG/24HR patch; Place 1 patch (0.05 mg total) onto the skin 2 (two) times a week.   Princess Bruins MD, 8:53 AM 10/01/2021

## 2021-10-04 LAB — CYTOLOGY - PAP: Diagnosis: NEGATIVE

## 2021-10-05 ENCOUNTER — Telehealth: Payer: Self-pay | Admitting: *Deleted

## 2021-10-05 NOTE — Telephone Encounter (Signed)
Clinical pharmacist to review Eliquis 

## 2021-10-05 NOTE — Telephone Encounter (Signed)
   [facility] Pre-operative Risk Assessment    Patient Name: Emily Nunez  DOB: [birth year] MRN: [id number]  HEARTCARE STAFF:  - IMPORTANT!!!!!! Under Visit Info/Reason for Call, type in Other and utilize the format Clearance MM/DD/YY or Clearance TBD. Do not use dashes or single digits. - Please review there is not already an duplicate clearance open for this procedure. - If request is for dental extraction, please clarify the # of teeth to be extracted. - If the patient is currently at the dentist's office, call Pre-Op Callback Staff (MA/nurse) to input urgent request.  - If the patient is not currently in the dentist office, please route to the Pre-Op pool.  Request for surgical clearance:  What type of surgery is being performed?  COLONOSCOPY  When is this surgery scheduled?  10/12/2021  What type of clearance is required (medical clearance vs. Pharmacy clearance to hold med vs. Both)?  PHARMACY  Are there any medications that need to be held prior to surgery and how long?  [place] name and name of physician performing surgery?  [place] / DR. MEDOFF   What is the office phone number?  [phone]   7.   What is the office fax number?  [phone]  8.   Anesthesia type (None, local, MAC, general) ?     Jeanann Lewandowsky 10/05/2021, 10:41 AM  _________________________________________________________________   (provider comments below)

## 2021-10-05 NOTE — Telephone Encounter (Signed)
Patient with diagnosis of afib on Eliquis for anticoagulation.    Procedure: colonoscopy Date of procedure: 10/12/21   CHA2DS2-VASc Score = 1  This indicates a 0.6% annual risk of stroke. The patient's score is based upon: CHF History: 0 HTN History: 0 Diabetes History: 0 Stroke History: 0 Vascular Disease History: 0 Age Score: 0 Gender Score: 1      CrCl 76.74 mL/min using adjusted body weight due to obesity Platelet count 256K  Recent admission 08/03/21 for chemical cardioversion with Tikosyn. Has successfully completed 4 weeks of uninterrupted anticoagulation following cardioversion.   Per office protocol, patient can hold Eliquis for 1 day prior to procedure.

## 2021-10-06 NOTE — Telephone Encounter (Signed)
    Patient Name: Emily Nunez  DOB: Sep 06, 1961 MRN: [id number]  Primary Cardiologist: Dr. Quentin Ore  Chart reviewed as part of pre-operative protocol coverage. Patient is scheduled for a colonoscopy on 10/12/2021 and we were asked to give our recommendations for holding Eliquis. He was recently seen by Dr. Quentin Ore on 09/23/2021 and was doing well. Per Pharmacy and office protocol, "patient can hold Eliquis for 1 day prior to procedure."  I will route this recommendation to the requesting party via Epic fax function and remove from pre-op pool.  Please call with questions.  Darreld Mclean, PA-C 10/06/2021, 9:11 AM

## 2021-10-07 ENCOUNTER — Other Ambulatory Visit: Payer: Self-pay

## 2021-10-07 ENCOUNTER — Telehealth: Payer: Self-pay

## 2021-10-07 DIAGNOSIS — R638 Other symptoms and signs concerning food and fluid intake: Secondary | ICD-10-CM

## 2021-10-07 NOTE — Telephone Encounter (Signed)
I called patient and spoke with her long enough to tell her I was calling about the referral to nutritionist and she said she would need to call us back.  I was just going to let her know referral has been placed and they will be calling her and laso to let her know about [place] program.

## 2021-10-07 NOTE — Telephone Encounter (Signed)
From: Princess Bruins, MD  Sent: 10/01/2021   9:27 AM EST  To: [facility] Triage   Subject: Refer to [facility]     BMI 40.  Weight loss/Nutrition.  Please inform yourself (and let me know :-)) as to whether they have a Gym as part of the program.    To Dr. Dellis Filbert: The [employer] and Exercise Program at [place] is focused on fitness and exercise. Enrollment fee and monthly fees for use of gym/fitness program. Personal trainers available for fee.   Does not include a nutritionist or personal nutritional evaluation. Seems to be focused on exercise fitness.   [facility] is located on [facility]  Dr. Dellis Filbert replied: "Thanks for that precious info Juliann Pulse.  I want to refer her to the Nutritionist.  Please give her the info on the [facility] at [place].  "

## 2021-10-12 ENCOUNTER — Telehealth: Payer: Self-pay

## 2021-10-12 MED ORDER — PROGESTERONE MICRONIZED 100 MG PO CAPS: 100.0000 mg | ORAL_CAPSULE | Freq: Every day | ORAL | 4 refills | Status: DC

## 2021-10-12 NOTE — Telephone Encounter (Signed)
Patient called because she filled her Progesterone in October and now she cannot find it.  She said pharmacy said she will have to pay out of pocket/ins will not cover it again so soon.  She needs Rx.    10/01/2021 visit "2. Postmenopausal hormone replacement therapy Well on the hormone replacement therapy but continued mild postmenopausal bleeding.  No contraindication otherwise to continue on hormone replacement therapy.  Decision to decrease the estradiol to a patch of 0.05 twice a week.  Continue Prometrium 100 mg per mouth at bedtime.  Estradiol prescription sent to pharmacy."

## 2021-11-02 ENCOUNTER — Other Ambulatory Visit (HOSPITAL_COMMUNITY): Payer: Self-pay

## 2021-12-07 ENCOUNTER — Encounter: Payer: BC Managed Care – PPO | Attending: Family Medicine | Admitting: Dietician

## 2021-12-07 ENCOUNTER — Other Ambulatory Visit: Payer: Self-pay

## 2021-12-07 ENCOUNTER — Encounter: Payer: Self-pay | Admitting: Dietician

## 2021-12-07 VITALS — Ht 66.0 in | Wt 241.0 lb

## 2021-12-07 DIAGNOSIS — E669 Obesity, unspecified: Secondary | ICD-10-CM | POA: Insufficient documentation

## 2021-12-07 DIAGNOSIS — E663 Overweight: Secondary | ICD-10-CM | POA: Diagnosis present

## 2021-12-07 NOTE — Patient Instructions (Addendum)
Begin to make the time to exercise. Sign up at the [facility] facility. Begin by exercising on the treadmill and free weights for 45-60 minutes two days a week.  Focus on your stress reduction. Schedule a massage!  Work towards your daily goal of drinking 64 oz of water! You're already close.  Begin to recognize carbohydrates, proteins, and non-starchy vegetables in your food choices!  Begin to build your meals using the proportions of the Balanced Plate. First, select your carb choice(s) for the meal.Make this 25% of your meal. Next, select your source of protein to pair with your carb choice(s). Make this another 25% of your meal. Finally, complete your meal with a variety of non-starchy vegetables. Make this the remaining 50% of your meal.

## 2021-12-07 NOTE — Progress Notes (Signed)
Medical Nutrition Therapy  Appointment Start time:  9024  Appointment End time:  1615  Primary concerns today: Weight Loss  Referral diagnosis: E66.3 - Overweight Preferred learning style: No preference indicated Learning readiness: Contemplating   NUTRITION ASSESSMENT   Anthropometrics  Ht: 5'6" Wt: 241.0 lbs Body mass index is 38.9 kg/m.    Clinical Medical Hx: Hypothyroidism, Atrial Fibrillation, Cancer, Vit D Deficiency Medications: Eliquis, Tikosyn, diltiazem, levothyroxine, Vitamin D, [place]-Con Labs: In range Notable Signs/Symptoms: Fatigue  Lifestyle & Dietary Hx Pt is interested in a dietary approach for weight loss. Pt reports a weight gain of 110 pounds over the last 10 years, and wants get down to a healthier weight. Pt reports lower back/hip pain that require cortisone shots. Pt reports spouse currently has severe health problems. 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 This is very stressful for them. Pt states they have difficulty reducing their stress. Pt reports that they do not emotionally eat. Pt reports their diagnosis of A-fib, coupled with their spouse's health troubles, motivated them to make changes for their health. Pt reports enjoying the treadmill for exercise. Pt was a member at [employer] in 2019 and would go 3 times a week.      Estimated daily fluid intake: 64 oz Supplements: Cod liver oil Sleep: Sleeps well Stress / self-care: Very high (9/10), spouses health problems Current average weekly physical activity: ADLs  24-Hr Dietary Recall First Meal: [employer] [place] toast, sausage, hashbrowns, coffee w/sugar Snack: none Second Meal: none Snack: none Third Meal: Chicken parmesan with spaghetti, water Snack: none Beverages: water, coffee   NUTRITION DIAGNOSIS  NB-1.1 Food and nutrition-related knowledge deficit As related to Obesity.  As evidenced by BMI of 38.90 kg/m2, lack of exercise, and dietary recall high in energy dense  foods.   NUTRITION INTERVENTION  Nutrition education (E-1) on the following topics:  Educated patient on the two components of energy balance: Energy in (calories), and energy out (activity). Explain the role of negative energy balance in weight loss. Discussed options with patient to achieve a negative energy balance and how to best control energy in and energy out to accommodate their lifestyle. Educated patient on the balanced plate eating model. Recommended lunch and dinner be 1/2 non-starchy vegetables, 1/4 starches, and 1/4 protein. Recommended breakfast be a balance of starch and protein with a piece of fruit. Discussed with patient the importance of working towards hitting the proportions of the balanced plate consistently. Counseled patient on ways to begin recognizing each of the food groups from the balanced plate in their own meals, and how close they are to fitting the recommended proportions of the balanced plate. Educated patient on the nutritional value of each food group on the balanced plate model. Counseled patient on beginning to rebuild their trust in themselves to make the right food choices for their health. Educated patient on mindful eating, including listening to their body's hunger and satiety cues, as well as eating slowly and allowing meals to be more of a sensory experience. Counseled patient on allowing themselves to be present in their emotions when they consider emotional eating. Advised patient to evaluate whether the impulse to eat is hunger based, or emotionally driven.  Handouts Provided Include  Balanced Plate Balanced Plate Food List Energy Balance Handout  Learning Style & Readiness for Change Teaching method utilized: Visual & Auditory  Demonstrated degree of understanding via: Teach Back  Barriers to learning/adherence to lifestyle change: High stress level  Goals Established  by Pt Begin to make the time to exercise. Sign up at the [facility] facility. Begin  by exercising on the treadmill and free weights for 45-60 minutes two days a week. Focus on your stress reduction. Schedule a massage! Work towards your daily goal of drinking 64 oz of water! You're already close. Begin to recognize carbohydrates, proteins, and non-starchy vegetables in your food choices! Begin to build your meals using the proportions of the Balanced Plate. First, select your carb choice(s) for the meal.Make this 25% of your meal. Next, select your source of protein to pair with your carb choice(s). Make this another 25% of your meal. Finally, complete your meal with a variety of non-starchy vegetables. Make this the remaining 50% of your meal.   MONITORING & EVALUATION Dietary intake, weekly physical activity, and stress level in 1 month.  Next Steps  Patient is to visit [facility] facility, and follow up with RD.

## 2021-12-09 ENCOUNTER — Other Ambulatory Visit (HOSPITAL_COMMUNITY): Payer: Self-pay | Admitting: *Deleted

## 2021-12-09 MED ORDER — DOFETILIDE 500 MCG PO CAPS: 500.0000 ug | ORAL_CAPSULE | Freq: Two times a day (BID) | ORAL | 1 refills | Status: DC

## 2021-12-16 ENCOUNTER — Other Ambulatory Visit: Payer: BC Managed Care – PPO

## 2021-12-16 ENCOUNTER — Other Ambulatory Visit: Payer: BC Managed Care – PPO | Admitting: Obstetrics & Gynecology

## 2021-12-24 ENCOUNTER — Other Ambulatory Visit: Payer: Self-pay

## 2021-12-24 ENCOUNTER — Ambulatory Visit: Payer: BC Managed Care – PPO | Admitting: Physician Assistant

## 2021-12-24 ENCOUNTER — Encounter: Payer: Self-pay | Admitting: Physician Assistant

## 2021-12-24 VITALS — BP 110/64 | HR 80 | Ht 66.0 in | Wt 237.4 lb

## 2021-12-24 DIAGNOSIS — Z79899 Other long term (current) drug therapy: Secondary | ICD-10-CM

## 2021-12-24 DIAGNOSIS — Z5181 Encounter for therapeutic drug level monitoring: Secondary | ICD-10-CM | POA: Diagnosis not present

## 2021-12-24 DIAGNOSIS — I428 Other cardiomyopathies: Secondary | ICD-10-CM | POA: Diagnosis not present

## 2021-12-24 DIAGNOSIS — I4819 Other persistent atrial fibrillation: Secondary | ICD-10-CM

## 2021-12-24 NOTE — Patient Instructions (Signed)
Medication Instructions:    Your physician recommends that you continue on your current medications as directed. Please refer to the Current Medication list given to you today.  *If you need a refill on your cardiac medications before your next appointment, please call your pharmacy*   Lab Work:BMET  AND MAG TODAY   If you have labs (blood work) drawn today and your tests are completely normal, you will receive your results only by: [place] (if you have MyChart) OR A paper copy in the mail If you have any lab test that is abnormal or we need to change your treatment, we will call you to review the results.   Testing/Procedures:  NONE ORDERED  TODAY    Follow-Up: At [facility], you and your health needs are our priority.  As part of our continuing mission to provide you with exceptional heart care, we have created designated Provider Care Teams.  These Care Teams include your primary Cardiologist (physician) and Advanced Practice Providers (APPs -  Physician Assistants and Nurse Practitioners) who all work together to provide you with the care you need, when you need it.  We recommend signing up for the patient portal called "MyChart".  Sign up information is provided on this After Visit Summary.  MyChart is used to connect with patients for Virtual Visits (Telemedicine).  Patients are able to view lab/test results, encounter notes, upcoming appointments, etc.  Non-urgent messages can be sent to your provider as well.   To learn more about what you can do with MyChart, go to [email or web address].    Your next appointment:   3 month(s)  The format for your next appointment:   In Person  Provider:   Tommye Standard, PA-C    Other Instructions

## 2021-12-24 NOTE — Progress Notes (Signed)
Cardiology Office Note Date:  12/24/2021  Patient ID:  C[place]elite, Violet 17-Nov-1961, MRN [id number] PCP:  Orpah Melter, MD  Electrophysiologist: Dr. Quentin Ore    Chief Complaint: [name] visit  History of Present Illness: ARELI FRARY is a 61 y.o. 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 Quentin Ore, seen by him 09/23/21, doing well, planned for labs and EP APP f/u 22mo, 77mo with him.  TODAY She got the flu and COVID in Oct and had a day of Afib with that, but the only event since on Tikosyn. 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   Hypothyroid    Urinary incontinence    Vitamin D deficiency 09/2010   VIT D = 22    Past Surgical History:  Procedure Laterality Date   BASAL CELL CARCINOMA EXCISION     BREAST LUMPECTOMY WITH RADIOACTIVE SEED LOCALIZATION Right 06/05/2019   Procedure: RIGHT BREAST LUMPECTOMY WITH RADIOACTIVE SEED LOCALIZATION;  Surgeon: Fanny Skates, MD;  Location: [place];  Service: General;  Laterality: Right;   BUBBLE STUDY  06/28/2021   Procedure: BUBBLE STUDY;  Surgeon: Fay Records, MD;  Location: [place];  Service: Cardiovascular;;   CARDIOVERSION N/A 06/28/2021   Procedure: CARDIOVERSION;  Surgeon: Fay Records, MD;  Location: [place];  Service: Cardiovascular;  Laterality: N/A;   COLONOSCOPY     DILATATION & CURETTAGE/HYSTEROSCOPY WITH MYOSURE N/A 06/25/2019    Procedure: [place];  Surgeon: Anastasio Auerbach, MD;  Location: [place];  Service: Gynecology;  Laterality: N/A;   ENDOMETRIAL ABLATION  2005   HYDROTHERMAL ABLATION   PELVIC LAPAROSCOPY  04/2004   ENDO/CYST   TEE WITHOUT CARDIOVERSION N/A 06/28/2021   Procedure: TRANSESOPHAGEAL ECHOCARDIOGRAM (TEE);  Surgeon: Fay Records, MD;  [facility] END[facility]COPY;  Service: Cardiovascular;  Laterality: N/A;   URETHRAL SLING      Current Outpatient Medications  Medication Sig Dispense Refill   acetaminophen (TYLENOL) 500 MG tablet Take 500-1,000 mg by mouth every 6 (six) hours as needed for mild pain or fever.     diltiazem (CARTIA XT) 240 MG 24 hr capsule Take 1 capsule (240 mg total) by mouth daily. 30 capsule 6   dofetilide (TIKOSYN) 500 MCG capsule Take 1 capsule (500 mcg total) by mouth 2 (two) times daily. 180 capsule 1   ELIQUIS 5 MG TABS tablet TAKE 1 TABLET(5 MG) BY MOUTH TWICE DAILY 60 tablet 3   estradiol (VIVELLE-DOT) 0.05 MG/24HR patch Place 1 patch (0.05 mg total) onto the skin 2 (two) times a week. 24 patch 4   levothyroxine (SYNTHROID) 150 MCG tablet Take 150-225 mcg by mouth See admin instructions. Take 1.5 tablets by mouth every Monday and Tuesday then take 1 tablet by mouth all other days of week     potassium chloride SA (KLOR-CON) 20 MEQ tablet Take 1 tablet (20 mEq total) by mouth  daily. 90 tablet 3   progesterone (PROMETRIUM) 100 MG capsule Take 1 capsule (100 mg total) by mouth at bedtime. 90 capsule 4   Vitamin D, Ergocalciferol, (DRISDOL) 1.25 MG (50000 UNIT) CAPS capsule Take 50,000 Units by mouth every Sunday.     zolpidem (AMBIEN) 10 MG tablet Take 10 mg by mouth at bedtime as needed for sleep.     No current facility-administered medications for this visit.    Allergies:   Vibegron, Ciprofloxacin, Macrobid [nitrofurantoin monohydrate macrocrystals], Septra [bactrim], and Sertraline   Social History:  The  patient  reports that she has never smoked. She has never used smokeless tobacco. She reports that she does not currently use alcohol. She reports that she does not use drugs.   Family History:  The patient's family history includes Cancer in her mother; Heart disease in her father.  ROS:  Please see the history of present illness.    All other systems are reviewed and otherwise negative.   PHYSICAL EXAM:  VS:  There were no vitals taken for this visit. BMI: There is no height or weight on file to calculate BMI. Well nourished, well developed, in no acute distress HEENT: normocephalic, atraumatic Neck: no JVD, carotid bruits or masses Cardiac:  RRR; no significant murmurs, no rubs, or gallops Lungs:  CTA b/l, no wheezing, rhonchi or rales Abd: soft, nontender MS: no deformity or atrophy Ext: no edema Skin: warm and dry, no rash Neuro:  No gross deficits appreciated Psych: euthymic mood, full affect  EKG:  Done today and reviewed by myself shows  SR 80bpm, QTc 475ms    08/04/21: TTE IMPRESSIONS   1. Left ventricular ejection fraction, by estimation, is 50 to 55%. The  left ventricle has low normal function. The left ventricle has no regional  wall motion abnormalities. Left ventricular diastolic parameters are  indeterminate.   2. Right ventricular systolic function is normal. The right ventricular  size is normal. There is normal pulmonary artery systolic pressure. The  estimated right ventricular systolic pressure is 54.6 mmHg.   3. The mitral valve is normal in structure. Trivial mitral valve  regurgitation. No evidence of mitral stenosis.   4. The aortic valve is normal in structure. Aortic valve regurgitation is  trivial. No aortic stenosis is present.   5. The inferior vena cava is normal in size with greater than 50%  respiratory variability, suggesting right atrial pressure of 3 mmHg.    06/28/21:TEE=   1. The left ventricle has moderately decreased function.   2. Right  ventricular systolic function is mildly reduced. The right  ventricular size is normal.   3. Left atrial size was mildly dilated. No left atrial/left atrial  appendage thrombus was detected.   4. The mitral valve is normal in structure. Trivial mitral valve  regurgitation.   5. The aortic valve is tricuspid. Aortic valve regurgitation is trivial.   6. Agitated saline contrast bubble study was negative, with no evidence  of any interatrial shunt.   Recent Labs: 05/17/2021: TSH 0.331 08/04/2021: Hemoglobin 11.2; Platelets 256 09/23/2021: BUN 18; Creatinine, Ser 0.96; Magnesium 1.9; Potassium 4.5; Sodium 141  No results found for requested labs within last 8760 hours.   CrCl cannot be calculated (Patient's most recent lab result is older than the maximum 21 days allowed.).   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  If stable EKG/labs then can move out to 78mo visit  Current medicines are reviewed at length with the patient today.  The patient did not have any concerns regarding medicines.  Venetia Night, PA-C 12/24/2021 6:11 AM     [facility] [address] [place] [address] [phone] (office)  [phone] (fax)

## 2021-12-25 LAB — BASIC METABOLIC PANEL
BUN/Creatinine Ratio: 21 (ref 12–28)
BUN: 16 mg/dL (ref 8–27)
CO2: 25 mmol/L (ref 20–29)
Calcium: 9.6 mg/dL (ref 8.7–10.3)
Chloride: 104 mmol/L (ref 96–106)
Creatinine, Ser: 0.78 mg/dL (ref 0.57–1.00)
Glucose: 98 mg/dL (ref 70–99)
Potassium: 4.1 mmol/L (ref 3.5–5.2)
Sodium: 141 mmol/L (ref 134–144)
eGFR: 87 mL/min/{1.73_m2} (ref >59)

## 2021-12-25 LAB — MAGNESIUM: Magnesium: 1.9 mg/dL (ref 1.6–2.3)

## 2022-01-18 ENCOUNTER — Ambulatory Visit: Payer: BC Managed Care – PPO | Admitting: Dietician

## 2022-01-25 ENCOUNTER — Ambulatory Visit: Payer: BC Managed Care – PPO | Admitting: Dietician

## 2022-02-02 ENCOUNTER — Other Ambulatory Visit: Payer: Self-pay | Admitting: *Deleted

## 2022-02-02 DIAGNOSIS — I4819 Other persistent atrial fibrillation: Secondary | ICD-10-CM

## 2022-02-02 MED ORDER — APIXABAN 5 MG PO TABS: ORAL_TABLET | ORAL | 10 refills | Status: DC

## 2022-02-02 NOTE — Telephone Encounter (Signed)
Eliquis '5mg'$  paper refill request received. Patient is 61 years old, weight-107.7kg, Crea-0.78 on 12/24/2021, Diagnosis-Afib, and last seen by Tommye Standard on 12/24/2021. Dose is appropriate based on dosing criteria. Will send in refill to requested pharmacy.   ?

## 2022-05-19 ENCOUNTER — Other Ambulatory Visit: Payer: Self-pay | Admitting: Physician Assistant

## 2022-06-05 NOTE — Progress Notes (Unsigned)
Cardiology Office Note   Date:  06/05/2022   ID:  Emily Nunez, DOB Aug 06, 1961, MRN [id number]  PCP:  Orpah Melter, MD  Cardiologist:  ***   EP: Dr. Quentin Ore No chief complaint on file.     History of Present Illness: Emily Nunez is a 61 y.o. 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) now recovered on last echo, obesity Afib hx Diagnosed Jun 2022, incidentally pre-op eval Tikosyn started Sept 2022 BMP and Mg+, TSH   Needs EKG and labs Past Medical History:  Diagnosis Date   Abnormality of right breast on screening mammogram 06/05/2019   Cancer ([facility])    BASAL CELL /NOSE   Cancer ([facility]) 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   Hypothyroid    Urinary incontinence    Vitamin D deficiency 09/2010   VIT D = 22    Past Surgical History:  Procedure Laterality Date   BASAL CELL CARCINOMA EXCISION     BREAST LUMPECTOMY WITH RADIOACTIVE SEED LOCALIZATION Right 06/05/2019   Procedure: RIGHT BREAST LUMPECTOMY WITH RADIOACTIVE SEED LOCALIZATION;  Surgeon: Fanny Skates, MD;  Location: [facility];  Service: General;  Laterality: Right;   BUBBLE STUDY  06/28/2021   Procedure: BUBBLE STUDY;  Surgeon: Fay Records, MD;  Location: [facility];  Service: Cardiovascular;;   CARDIOVERSION N/A 06/28/2021   Procedure: CARDIOVERSION;  Surgeon: Fay Records, MD;  Location: [facility];  Service: Cardiovascular;  Laterality: N/A;   COLONOSCOPY     DILATATION & CURETTAGE/HYSTEROSCOPY WITH MYOSURE N/A 06/25/2019   Procedure: [place];  Surgeon: Anastasio Auerbach, MD;  Location: [facility];  Service: Gynecology;  Laterality: N/A;   ENDOMETRIAL ABLATION  2005   HYDROTHERMAL ABLATION   PELVIC LAPAROSCOPY  04/2004   ENDO/CYST   TEE WITHOUT CARDIOVERSION N/A 06/28/2021   Procedure: TRANSESOPHAGEAL ECHOCARDIOGRAM (TEE);  Surgeon: Fay Records, MD;  [facility] ENDOSCOPY;   Service: Cardiovascular;  Laterality: N/A;   URETHRAL SLING       Current Outpatient Medications  Medication Sig Dispense Refill   acetaminophen (TYLENOL) 500 MG tablet Take 500-1,000 mg by mouth every 6 (six) hours as needed for mild pain or fever.     apixaban (ELIQUIS) 5 MG TABS tablet TAKE 1 TABLET(5 MG) BY MOUTH TWICE DAILY 60 tablet 10   diltiazem (CARDIZEM CD) 240 MG 24 hr capsule Take 1 capsule (240 mg total) by mouth daily. Please call [phone] to schedule an appointment for future refills. Thank you. 1st attempt. 30 capsule 0   dofetilide (TIKOSYN) 500 MCG capsule Take 1 capsule (500 mcg total) by mouth 2 (two) times daily. 180 capsule 1   estradiol (VIVELLE-DOT) 0.05 MG/24HR patch Place 1 patch (0.05 mg total) onto the skin 2 (two) times a week. (Patient not taking: Reported on 12/24/2021) 24 patch 4   fluticasone (FLONASE) 50 MCG/ACT nasal spray Place 2 sprays into both nostrils daily.     levothyroxine (SYNTHROID) 150 MCG tablet Take 150-225 mcg by mouth See admin instructions. Take 1.5 tablets by mouth every Monday and Tuesday then take 1 tablet by mouth all other days of week     potassium chloride SA (KLOR-CON) 20 MEQ tablet Take 1 tablet (20 mEq total) by mouth daily. 90 tablet 3   progesterone (PROMETRIUM) 100 MG capsule Take 1 capsule (100 mg total) by mouth at bedtime. (Patient not taking: Reported on 12/24/2021) 90 capsule 4   Vitamin  D, Ergocalciferol, (DRISDOL) 1.25 MG (50000 UNIT) CAPS capsule Take 50,000 Units by mouth every Sunday.     zolpidem (AMBIEN) 10 MG tablet Take 10 mg by mouth at bedtime as needed for sleep.     No current facility-administered medications for this visit.    Allergies:   Vibegron, Ciprofloxacin, Macrobid [nitrofurantoin monohydrate macrocrystals], Phentermine, Septra [bactrim], and Sertraline    Social History:  The patient  reports that she has never smoked. She has never used smokeless tobacco. She reports that she does not currently  use alcohol. She reports that she does not use drugs.   Family History:  The patient's ***family history includes Cancer in her mother; Heart disease in her father.    ROS:  General:no colds or fevers, no weight changes Skin:no rashes or ulcers HEENT:no blurred vision, no congestion CV:see HPI PUL:see HPI GI:no diarrhea constipation or melena, no indigestion GU:no hematuria, no dysuria MS:no joint pain, no claudication Neuro:no syncope, no lightheadedness Endo:no diabetes, no thyroid disease Wt Readings from Last 3 Encounters:  12/24/21 237 lb 6.4 oz (107.7 kg)  12/07/21 241 lb (109.3 kg)  10/01/21 239 lb (108.4 kg)     PHYSICAL EXAM: VS:  There were no vitals taken for this visit. , BMI There is no height or weight on file to calculate BMI. General:Pleasant affect, NAD Skin:Warm and dry, brisk capillary refill HEENT:normocephalic, sclera clear, mucus membranes moist Neck:supple, no JVD, no bruits  Heart:S1S2 RRR without murmur, gallup, rub or click Lungs:clear without rales, rhonchi, or wheezes ZOX:WRUE, non tender, + BS, do not palpate liver spleen or masses Ext:no lower ext edema, 2+ pedal pulses, 2+ radial pulses Neuro:alert and oriented, MAE, follows commands, + facial symmetry    EKG:  EKG is ordered today. The ekg ordered today demonstrates ***   Recent Labs: 08/04/2021: Hemoglobin 11.2; Platelets 256 12/24/2021: BUN 16; Creatinine, Ser 0.78; Magnesium 1.9; Potassium 4.1; Sodium 141    Lipid Panel No results found for: "CHOL", "TRIG", "HDL", "CHOLHDL", "VLDL", "LDLCALC", "LDLDIRECT"     Other studies Reviewed: Additional studies/ records that were reviewed today include: ***. 08/04/21: TTE IMPRESSIONS   1. Left ventricular ejection fraction, by estimation, is 50 to 55%. The  left ventricle has low normal function. The left ventricle has no regional  wall motion abnormalities. Left ventricular diastolic parameters are  indeterminate.   2. Right ventricular  systolic function is normal. The right ventricular  size is normal. There is normal pulmonary artery systolic pressure. The  estimated right ventricular systolic pressure is 45.4 mmHg.   3. The mitral valve is normal in structure. Trivial mitral valve  regurgitation. No evidence of mitral stenosis.   4. The aortic valve is normal in structure. Aortic valve regurgitation is  trivial. No aortic stenosis is present.   5. The inferior vena cava is normal in size with greater than 50%  respiratory variability, suggesting right atrial pressure of 3 mmHg.    06/28/21:TEE=   1. The left ventricle has moderately decreased function.   2. Right ventricular systolic function is mildly reduced. The right  ventricular size is normal.   3. Left atrial size was mildly dilated. No left atrial/left atrial  appendage thrombus was detected.   4. The mitral valve is normal in structure. Trivial mitral valve  regurgitation.   5. The aortic valve is tricuspid. Aortic valve regurgitation is trivial.   6. Agitated saline contrast bubble study was negative, with no evidence  of any interatrial shunt.   ASSESSMENT  AND PLAN:  1.  ***   Current medicines are reviewed with the patient today.  The patient Has no concerns regarding medicines.  The following changes have been made:  See above Labs/ tests ordered today include:see above  Disposition:   FU:  see above  Signed, Cecilie Kicks, NP  06/05/2022 9:13 PM    [facility] [place], [place], [place], [place] Phone: [phone]; Fax: [phone]

## 2022-06-06 ENCOUNTER — Ambulatory Visit: Payer: BC Managed Care – PPO | Admitting: Cardiology

## 2022-06-06 ENCOUNTER — Encounter: Payer: Self-pay | Admitting: Cardiology

## 2022-06-06 VITALS — BP 120/76 | HR 80 | Ht 66.0 in | Wt 233.0 lb

## 2022-06-06 DIAGNOSIS — Z79899 Other long term (current) drug therapy: Secondary | ICD-10-CM

## 2022-06-06 DIAGNOSIS — I428 Other cardiomyopathies: Secondary | ICD-10-CM

## 2022-06-06 DIAGNOSIS — Z5181 Encounter for therapeutic drug level monitoring: Secondary | ICD-10-CM | POA: Diagnosis not present

## 2022-06-06 DIAGNOSIS — Z7901 Long term (current) use of anticoagulants: Secondary | ICD-10-CM

## 2022-06-06 DIAGNOSIS — I4819 Other persistent atrial fibrillation: Secondary | ICD-10-CM | POA: Diagnosis not present

## 2022-06-06 MED ORDER — DOFETILIDE 500 MCG PO CAPS: 500.0000 ug | ORAL_CAPSULE | Freq: Two times a day (BID) | ORAL | 3 refills | Status: DC

## 2022-06-06 MED ORDER — DILTIAZEM HCL ER COATED BEADS 240 MG PO CP24: 240.0000 mg | ORAL_CAPSULE | Freq: Every day | ORAL | 3 refills | Status: DC

## 2022-06-06 NOTE — Addendum Note (Signed)
Addended by: Gaetano Net on: 06/06/2022 02:36 PM   Modules accepted: Orders

## 2022-06-06 NOTE — Patient Instructions (Signed)
Medication Instructions:   Your physician recommends that you continue on your current medications as directed. Please refer to the Current Medication list given to you today.   *If you need a refill on your cardiac medications before your next appointment, please call your pharmacy*   Lab Work:  TODAY!!!!  CMET/MAG/TSH/CBC  If you have labs (blood work) drawn today and your tests are completely normal, you will receive your results only by: [facility] (if you have MyChart) OR A paper copy in the mail If you have any lab test that is abnormal or we need to change your treatment, we will call you to review the results.   Testing/Procedures:  None ordered.   Follow-Up: At [facility], you and your health needs are our priority.  As part of our continuing mission to provide you with exceptional heart care, we have created designated Provider Care Teams.  These Care Teams include your primary Cardiologist (physician) and Advanced Practice Providers (APPs -  Physician Assistants and Nurse Practitioners) who all work together to provide you with the care you need, when you need it.  We recommend signing up for the patient portal called "MyChart".  Sign up information is provided on this After Visit Summary.  MyChart is used to connect with patients for Virtual Visits (Telemedicine).  Patients are able to view lab/test results, encounter notes, upcoming appointments, etc.  Non-urgent messages can be sent to your provider as well.   To learn more about what you can do with MyChart, go to [email or web address].    Your next appointment:   3 month(s)  The format for your next appointment:   In Person  Provider:   You will see one of the following Advanced Practice Providers on your designated Care Team:   Tommye Standard, [place]       Important Information About Sugar

## 2022-06-07 ENCOUNTER — Other Ambulatory Visit: Payer: Self-pay | Admitting: *Deleted

## 2022-06-07 DIAGNOSIS — R790 Abnormal level of blood mineral: Secondary | ICD-10-CM

## 2022-06-07 DIAGNOSIS — Z79899 Other long term (current) drug therapy: Secondary | ICD-10-CM

## 2022-06-07 LAB — COMPREHENSIVE METABOLIC PANEL
ALT: 7 IU/L (ref 0–32)
AST: 12 IU/L (ref 0–40)
Albumin/Globulin Ratio: 1.6 (ref 1.2–2.2)
Albumin: 4.2 g/dL (ref 3.9–4.9)
Alkaline Phosphatase: 80 IU/L (ref 44–121)
BUN/Creatinine Ratio: 22 (ref 12–28)
BUN: 22 mg/dL (ref 8–27)
Bilirubin Total: <0.2 mg/dL (ref 0.0–1.2)
CO2: 24 mmol/L (ref 20–29)
Calcium: 9.7 mg/dL (ref 8.7–10.3)
Chloride: 106 mmol/L (ref 96–106)
Creatinine, Ser: 0.99 mg/dL (ref 0.57–1.00)
Globulin, Total: 2.7 g/dL (ref 1.5–4.5)
Glucose: 92 mg/dL (ref 70–99)
Potassium: 4.5 mmol/L (ref 3.5–5.2)
Sodium: 142 mmol/L (ref 134–144)
Total Protein: 6.9 g/dL (ref 6.0–8.5)
eGFR: 65 mL/min/{1.73_m2} (ref >59)

## 2022-06-07 LAB — CBC
Hematocrit: 35 % (ref 34.0–46.6)
Hemoglobin: 11.2 g/dL (ref 11.1–15.9)
MCH: 28.6 pg (ref 26.6–33.0)
MCHC: 32 g/dL (ref 31.5–35.7)
MCV: 90 fL (ref 79–97)
Platelets: 286 10*3/uL (ref 150–450)
RBC: 3.91 x10E6/uL (ref 3.77–5.28)
RDW: 13.6 % (ref 11.7–15.4)
WBC: 5.9 10*3/uL (ref 3.4–10.8)

## 2022-06-07 LAB — TSH: TSH: 0.756 u[IU]/mL (ref 0.450–4.500)

## 2022-06-07 LAB — MAGNESIUM: Magnesium: 1.8 mg/dL (ref 1.6–2.3)

## 2022-06-07 MED ORDER — MAGNESIUM OXIDE 420 MG PO TABS: 1.0000 | ORAL_TABLET | Freq: Every day | ORAL | 11 refills | Status: DC

## 2022-06-10 ENCOUNTER — Other Ambulatory Visit (HOSPITAL_COMMUNITY): Payer: Self-pay | Admitting: Nurse Practitioner

## 2022-06-17 ENCOUNTER — Other Ambulatory Visit (HOSPITAL_COMMUNITY): Payer: Self-pay | Admitting: *Deleted

## 2022-06-17 MED ORDER — DOFETILIDE 500 MCG PO CAPS
500.0000 ug | ORAL_CAPSULE | Freq: Two times a day (BID) | ORAL | 1 refills | Status: DC
Start: 2022-06-17 — End: 2022-12-28

## 2022-06-20 ENCOUNTER — Other Ambulatory Visit: Payer: BC Managed Care – PPO

## 2022-06-20 DIAGNOSIS — R790 Abnormal level of blood mineral: Secondary | ICD-10-CM

## 2022-06-20 DIAGNOSIS — Z79899 Other long term (current) drug therapy: Secondary | ICD-10-CM

## 2022-06-21 ENCOUNTER — Other Ambulatory Visit: Payer: Self-pay | Admitting: *Deleted

## 2022-06-21 ENCOUNTER — Telehealth: Payer: Self-pay | Admitting: *Deleted

## 2022-06-21 ENCOUNTER — Other Ambulatory Visit: Payer: Self-pay | Admitting: Neurosurgery

## 2022-06-21 LAB — MAGNESIUM: Magnesium: 2.5 mg/dL — ABNORMAL HIGH (ref 1.6–2.3)

## 2022-06-21 NOTE — Telephone Encounter (Signed)
   Pre-operative Risk Assessment    Patient Name: Emily Nunez  DOB: 10-03-1961 MRN: [id number]      Request for Surgical Clearance    Procedure:   LUMBAR FUSION  Date of Surgery:  Clearance 07/22/22                                 Surgeon:  DR. Consuella Lose Surgeon's Group or Practice Name:  [facility] Phone number:  [phone] Fax number:  [phone] ATTN: [name]   Type of Clearance Requested:   - Medical  - Pharmacy:  Hold Apixaban (Eliquis)     Type of Anesthesia:  General    Additional requests/questions:    [name]   06/21/2022, 6:14 PM

## 2022-06-22 NOTE — Telephone Encounter (Signed)
Clinical pharmacist to review Eliquis 

## 2022-06-24 NOTE — Telephone Encounter (Signed)
   Name: Emily Nunez  DOB: 08-06-1961  MRN: [id number]   Primary Cardiologist: None  Chart reviewed as part of pre-operative protocol coverage. Patient was contacted 06/24/2022 in reference to pre-operative risk assessment for pending surgery as outlined below.  Emily Nunez was last seen on 06/06/2022 by Cecilie Kicks, NP.  Since that day, Emily Nunez has done well from a cardiac standpoint.  She denies any new symptoms or concerns.  She is able to complete greater than 4 METS without difficulty.  Therefore, based on ACC/AHA guidelines, the patient would be at acceptable risk for the planned procedure without further cardiovascular testing.   Patient with diagnosis of afib on Eliquis for anticoagulation.     Procedure: lumbar fusion Date of procedure: 07/22/22   CHA2DS2-VASc Score = 1  This indicates a 0.6% annual risk of stroke. The patient's score is based upon: CHF History: 0 HTN History: 0 Diabetes History: 0 Stroke History: 0 Vascular Disease History: 0 Age Score: 0 Gender Score: 1      CrCl 65m/min using adjusted body weight due to obesity Platelet count 286K   Per office protocol, patient can hold Eliquis for 3 days prior to procedure.  Please resume Eliquis as soon as possible postprocedure, at the discretion of the surgeon.  The patient was advised that if she develops new symptoms prior to surgery to contact our office to arrange for a follow-up visit, and she verbalized understanding.  I will route this recommendation to the requesting party via Epic fax function and remove from pre-op pool. Please call with questions.  ELenna Sciara NP 06/24/2022, 11:45 AM

## 2022-06-24 NOTE — Telephone Encounter (Signed)
Patient with diagnosis of afib on Eliquis for anticoagulation.    Procedure: lumbar fusion Date of procedure: 07/22/22  CHA2DS2-VASc Score = 1  This indicates a 0.6% annual risk of stroke. The patient's score is based upon: CHF History: 0 HTN History: 0 Diabetes History: 0 Stroke History: 0 Vascular Disease History: 0 Age Score: 0 Gender Score: 1      CrCl 39m/min using adjusted body weight due to obesity Platelet count 286K  Per office protocol, patient can hold Eliquis for 3 days prior to procedure.    **This guidance is not considered finalized until pre-operative APP has relayed final recommendations.**

## 2022-06-30 ENCOUNTER — Other Ambulatory Visit: Payer: Self-pay | Admitting: Neurosurgery

## 2022-07-01 IMAGING — MG MM DIGITAL SCREENING BILAT W/ TOMO AND CAD
8 series · 8 of 24 positions shown · non-contrast
Comparison: Previous exam(s).

CLINICAL DATA: Screening.

EXAM:
DIGITAL SCREENING BILATERAL MAMMOGRAM WITH TOMOSYNTHESIS AND CAD
TECHNIQUE: Bilateral screening digital craniocaudal and mediolateral oblique
mammograms were obtained. Bilateral screening digital breast
tomosynthesis was performed. The images were evaluated with
computer-aided detection.

[R CC synth-2D]
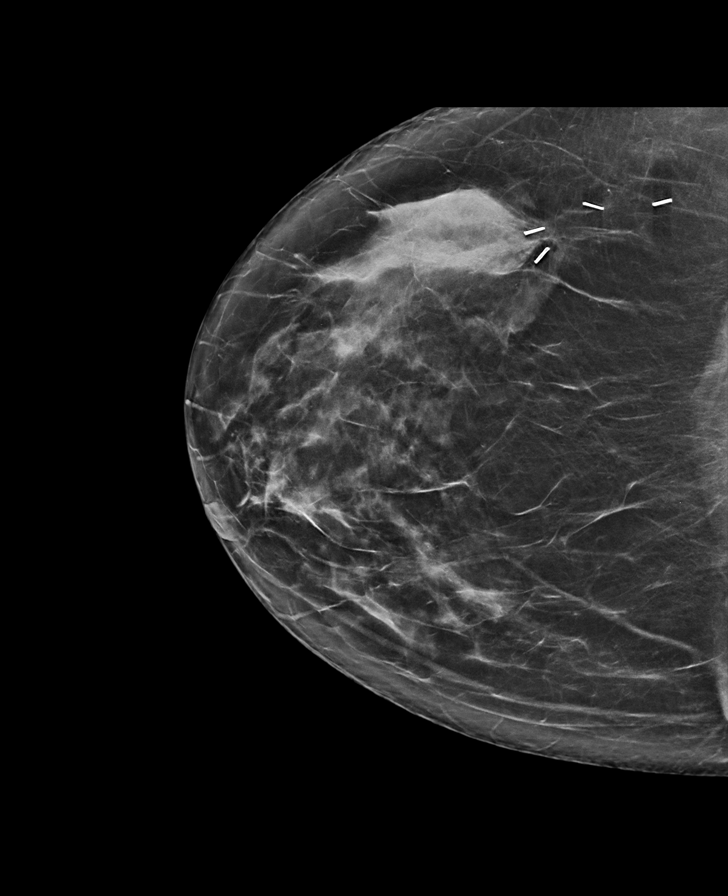

[R MLO synth-2D]
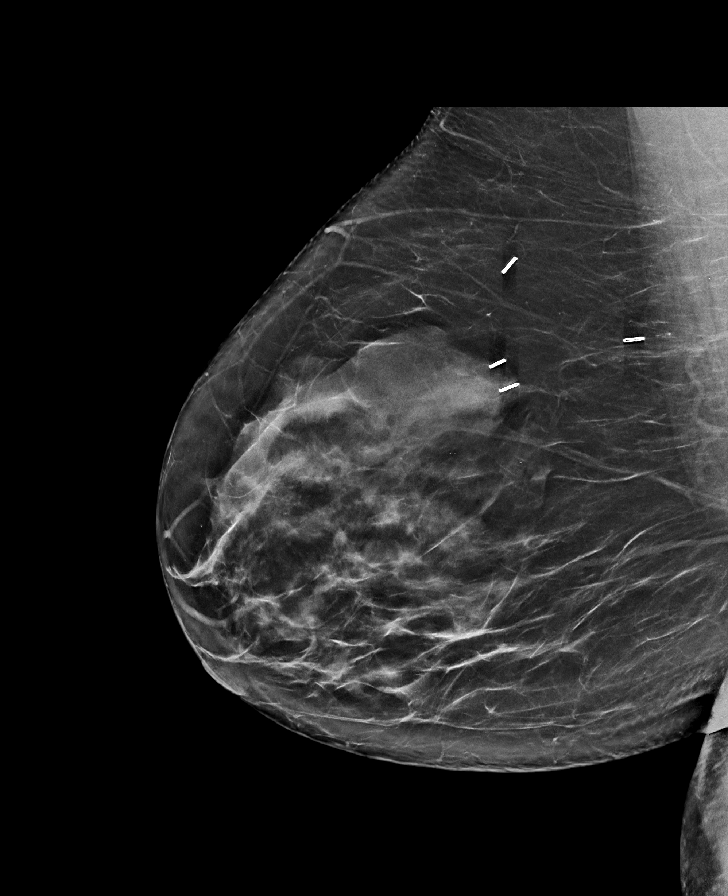

[L CC synth-2D]
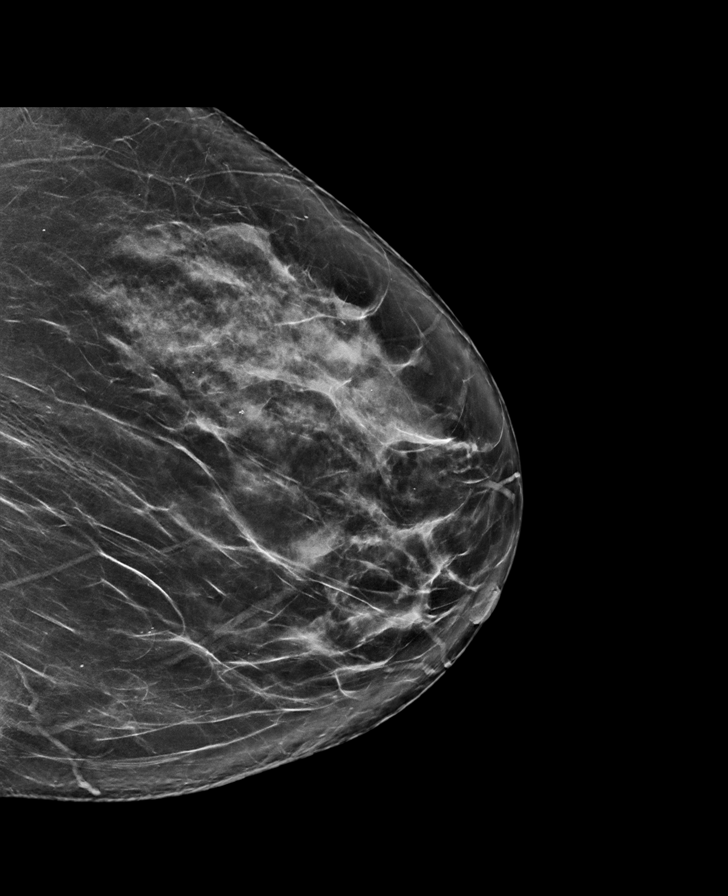

[L MLO synth-2D]
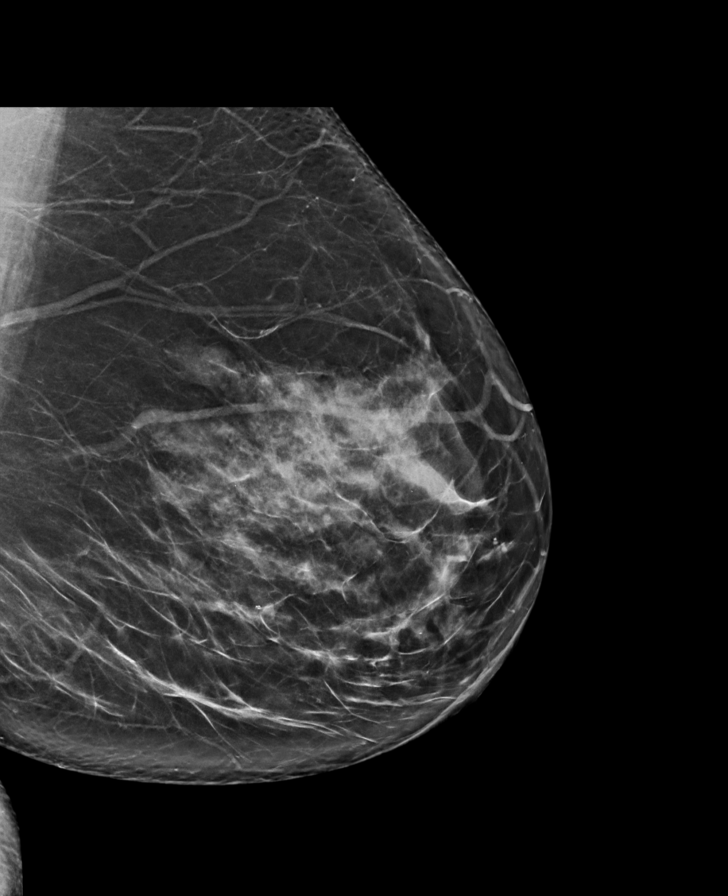

[L CC tomo · tomo slice 42/83.0]
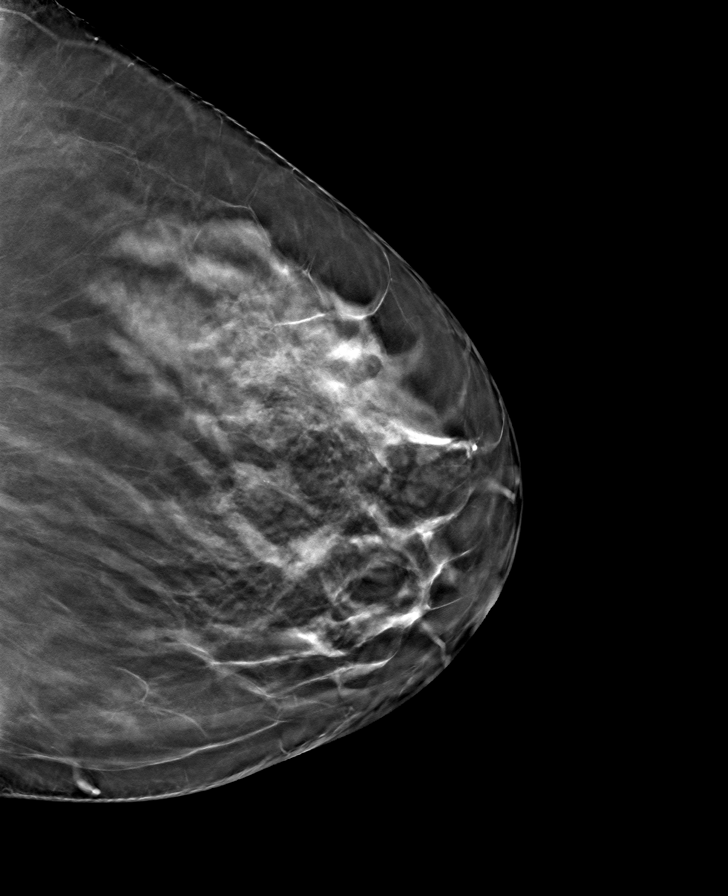

[R MLO tomo · tomo slice 43/86.0]
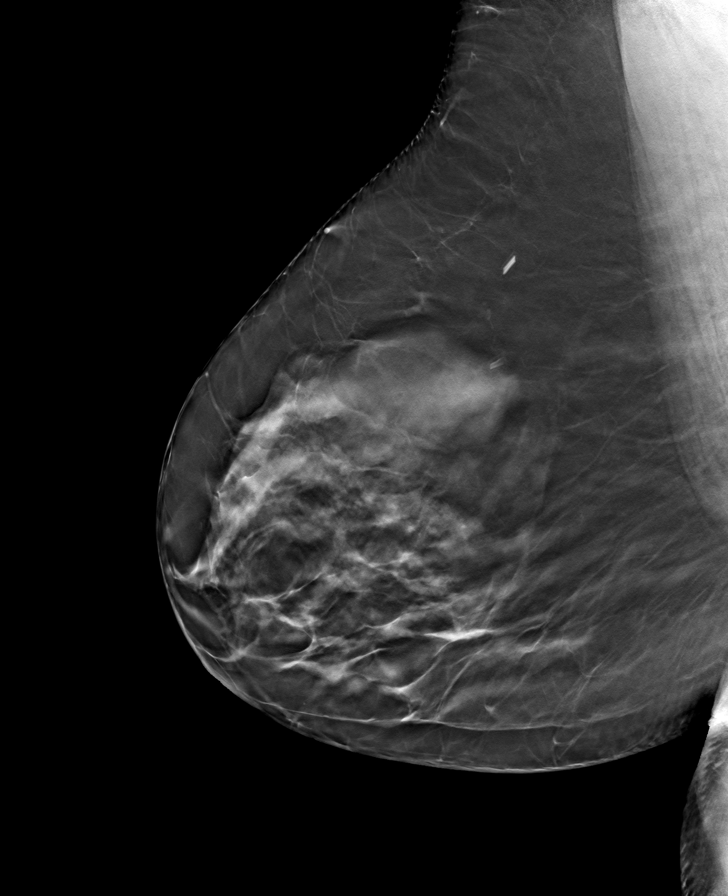

[R CC tomo · tomo slice 41/80.0]
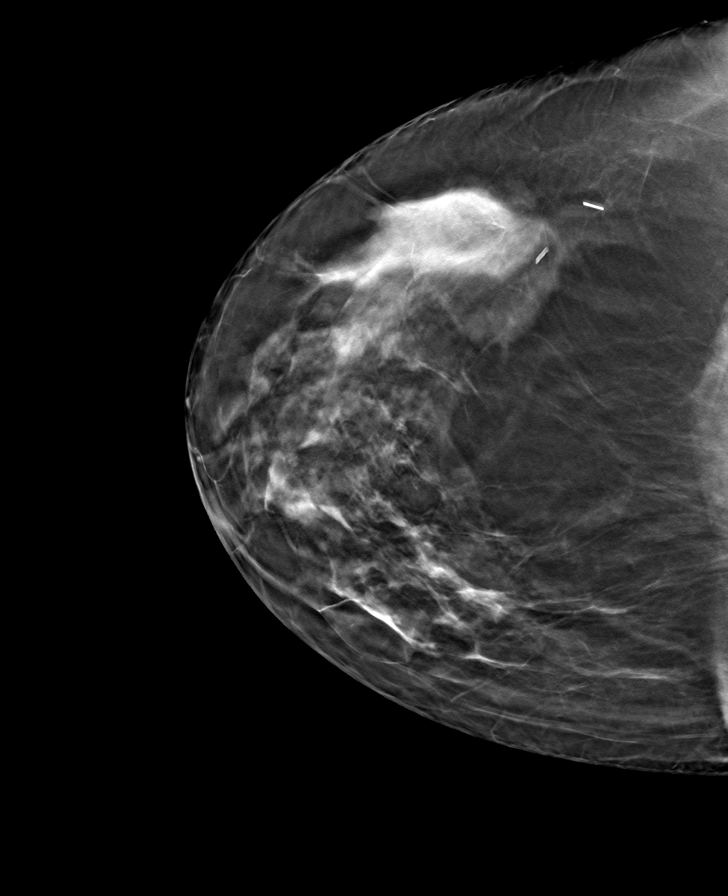

[L MLO tomo · tomo slice 42/83.0]
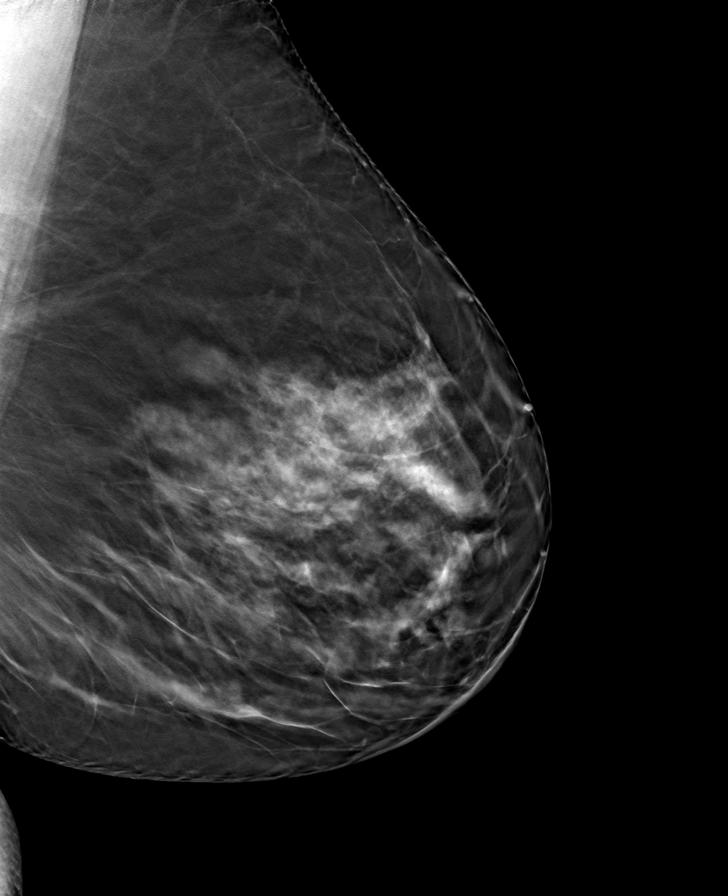

[8 of 24 positions shown; findings below may reference images not displayed]

ACR Breast Density Category c: The breast tissue is heterogeneously
dense, which may obscure small masses.
FINDINGS: There are no findings suspicious for malignancy.
IMPRESSION: No mammographic evidence of malignancy. A result letter of this
screening mammogram will be mailed directly to the patient.

RECOMMENDATION:
Screening mammogram in one year. (Code:Q3-W-BC3)

BI-RADS CATEGORY  1: Negative.

## 2022-07-05 ENCOUNTER — Other Ambulatory Visit: Payer: Self-pay | Admitting: Obstetrics & Gynecology

## 2022-07-05 DIAGNOSIS — Z1231 Encounter for screening mammogram for malignant neoplasm of breast: Secondary | ICD-10-CM

## 2022-07-06 ENCOUNTER — Encounter (INDEPENDENT_AMBULATORY_CARE_PROVIDER_SITE_OTHER): Payer: Self-pay

## 2022-07-15 NOTE — Progress Notes (Signed)
Surgical Instructions    Your procedure is scheduled on Friday August 25th.  Report to [facility] Main Entrance "A" at 5:30 A.M., then check in with the Admitting office.  Call this number if you have problems the morning of surgery:  [phone]   If you have any questions prior to your surgery date call [phone]: Open Monday-Friday 8am-4pm    Remember:  Do not eat or drink after midnight the night before your surgery     Take these medicines the morning of surgery with A SIP OF WATER: diltiazem (CARDIZEM CD) 240 MG 24 hr capsule dofetilide (TIKOSYN) 500 MCG capsule levothyroxine (SYNTHROID) 150 MCG tablet potassium chloride SA (KLOR-CON) 20 MEQ tablet   IF NEEDED  acetaminophen (TYLENOL) 500 MG tablet    Follow your surgeon's instructions on when to stop Eliquis.  If no instructions were given by your surgeon then you will need to call the office to get those instructions.     As of today, STOP taking any Aspirin (unless otherwise instructed by your surgeon) Aleve, Naproxen, Ibuprofen, Motrin, Advil, Goody's, BC's, all herbal medications, fish oil, and all vitamins.           Do not wear jewelry or makeup. Do not wear lotions, powders, perfumes or deodorant. Do not shave 48 hours prior to surgery.   Do not bring valuables to the hospital. Do not wear nail polish, gel polish, artificial nails, or any other type of covering on natural nails (fingers and toes) If you have artificial nails or gel coating that need to be removed by a nail salon, please have this removed prior to surgery. Artificial nails or gel coating may interfere with anesthesia's ability to adequately monitor your vital signs.  [facility] is not responsible for any belongings or valuables.    Do NOT Smoke (Tobacco/Vaping)  24 hours prior to your procedure  If you use a CPAP at night, you may bring your mask for your overnight stay.   Contacts, glasses, hearing aids, dentures or partials may not be  worn into surgery, please bring cases for these belongings   For patients admitted to the hospital, discharge time will be determined by your treatment team.   Patients discharged the day of surgery will not be allowed to drive home, and someone needs to stay with them for 24 hours.   SURGICAL WAITING ROOM VISITATION Patients having surgery or a procedure may have no more than 2 support people in the waiting area - these visitors may rotate.   Children under the age of 27 must have an adult with them who is not the patient. If the patient needs to stay at the hospital during part of their recovery, the visitor guidelines for inpatient rooms apply. Pre-op nurse will coordinate an appropriate time for 1 support person to accompany patient in pre-op.  This support person may not rotate.   Please refer to the [facility] website for the visitor guidelines for Inpatients (after your surgery is over and you are in a regular room).    Special instructions:    Oral Hygiene is also important to reduce your risk of infection.  Remember - BRUSH YOUR TEETH THE MORNING OF SURGERY WITH YOUR REGULAR TOOTHPASTE   - Preparing For Surgery  Before surgery, you can play an important role. Because skin is not sterile, your skin needs to be as free of germs as possible. You can reduce the number of germs on your skin by washing with CHG (  chlorahexidine gluconate) Soap before surgery.  CHG is an antiseptic cleaner which kills germs and bonds with the skin to continue killing germs even after washing.     Please do not use if you have an allergy to CHG or antibacterial soaps. If your skin becomes reddened/irritated stop using the CHG.  Do not shave (including legs and underarms) for at least 48 hours prior to first CHG shower. It is OK to shave your face.  Please follow these instructions carefully.     Shower the NIGHT BEFORE SURGERY and the MORNING OF SURGERY with CHG Soap.   If you chose to  wash your hair, wash your hair first as usual with your normal shampoo. After you shampoo, rinse your hair and body thoroughly to remove the shampoo.  Then [employer] and genitals (private parts) with your normal soap and rinse thoroughly to remove soap.  After that Use CHG Soap as you would any other liquid soap. You can apply CHG directly to the skin and wash gently with a scrungie or a clean washcloth.   Apply the CHG Soap to your body ONLY FROM THE NECK DOWN.  Do not use on open wounds or open sores. Avoid contact with your eyes, ears, mouth and genitals (private parts). Wash Face and genitals (private parts)  with your normal soap.   Wash thoroughly, paying special attention to the area where your surgery will be performed.  Thoroughly rinse your body with warm water from the neck down.  DO NOT shower/wash with your normal soap after using and rinsing off the CHG Soap.  Pat yourself dry with a CLEAN TOWEL.  Wear CLEAN PAJAMAS to bed the night before surgery  Place CLEAN SHEETS on your bed the night before your surgery  DO NOT SLEEP WITH PETS.   Day of Surgery:  Take a shower with CHG soap. Wear Clean/Comfortable clothing the morning of surgery Do not apply any deodorants/lotions.   Remember to brush your teeth WITH YOUR REGULAR TOOTHPASTE.    If you received a COVID test during your pre-op visit, it is requested that you wear a mask when out in public, stay away from anyone that may not be feeling well, and notify your surgeon if you develop symptoms. If you have been in contact with anyone that has tested positive in the last 10 days, please notify your surgeon.    Please read over the following fact sheets that you were given.

## 2022-07-18 ENCOUNTER — Encounter (HOSPITAL_COMMUNITY): Payer: Self-pay

## 2022-07-18 ENCOUNTER — Other Ambulatory Visit: Payer: Self-pay

## 2022-07-18 ENCOUNTER — Encounter (HOSPITAL_COMMUNITY)
Admission: RE | Admit: 2022-07-18 | Discharge: 2022-07-18 | Disposition: A | Discharge status: Home / Self Care | Payer: BC Managed Care – PPO | Source: Ambulatory Visit | Attending: Neurosurgery | Admitting: Neurosurgery

## 2022-07-18 VITALS — BP 113/70 | HR 80 | Temp 98.1°F | Resp 17 | Ht 66.0 in | Wt 228.9 lb

## 2022-07-18 DIAGNOSIS — I4891 Unspecified atrial fibrillation: Secondary | ICD-10-CM | POA: Diagnosis not present

## 2022-07-18 DIAGNOSIS — I429 Cardiomyopathy, unspecified: Secondary | ICD-10-CM | POA: Diagnosis not present

## 2022-07-18 DIAGNOSIS — Z01812 Encounter for preprocedural laboratory examination: Secondary | ICD-10-CM | POA: Diagnosis not present

## 2022-07-18 DIAGNOSIS — Z01818 Encounter for other preprocedural examination: Secondary | ICD-10-CM

## 2022-07-18 DIAGNOSIS — I2510 Atherosclerotic heart disease of native coronary artery without angina pectoris: Secondary | ICD-10-CM | POA: Diagnosis not present

## 2022-07-18 DIAGNOSIS — Z7901 Long term (current) use of anticoagulants: Secondary | ICD-10-CM | POA: Diagnosis not present

## 2022-07-18 HISTORY — DX: Cardiac arrhythmia, unspecified: I49.9

## 2022-07-18 LAB — TYPE AND SCREEN
ABO/RH(D): O POS
Antibody Screen: NEGATIVE

## 2022-07-18 LAB — CBC
HCT: 37.2 % (ref 36.0–46.0)
Hemoglobin: 12.1 g/dL (ref 12.0–15.0)
MCH: 29.1 pg (ref 26.0–34.0)
MCHC: 32.5 g/dL (ref 30.0–36.0)
MCV: 89.4 fL (ref 80.0–100.0)
Platelets: 278 10*3/uL (ref 150–400)
RBC: 4.16 MIL/uL (ref 3.87–5.11)
RDW: 13.4 % (ref 11.5–15.5)
WBC: 5.4 10*3/uL (ref 4.0–10.5)
nRBC: 0 % (ref 0.0–0.2)

## 2022-07-18 LAB — BASIC METABOLIC PANEL
Anion gap: 8 (ref 5–15)
BUN: 18 mg/dL (ref 8–23)
CO2: 25 mmol/L (ref 22–32)
Calcium: 9.5 mg/dL (ref 8.9–10.3)
Chloride: 105 mmol/L (ref 98–111)
Creatinine, Ser: 0.83 mg/dL (ref 0.44–1.00)
GFR, Estimated: >60 mL/min (ref >60)
Glucose, Bld: 99 mg/dL (ref 70–99)
Potassium: 4.1 mmol/L (ref 3.5–5.1)
Sodium: 138 mmol/L (ref 135–145)

## 2022-07-18 LAB — SURGICAL PCR SCREEN
MRSA, PCR: NEGATIVE
Staphylococcus aureus: NEGATIVE

## 2022-07-18 NOTE — Progress Notes (Addendum)
PCP - Dr. Orpah Melter Cardiologist - Dr. Quentin Ore  PPM/ICD - Denies Device Orders - n/a Rep Notified - n/a  Chest x-ray - n/a EKG - 06/06/2022 Stress Test - n/a ECHO - 08/04/2021 Cardiac Cath - n/a  Sleep Study - Denies CPAP - n/a  No DM  Blood Thinner Instructions: Pts last dose of Eliquis is 8/21. Aspirin Instructions: n/a  NPO after midnight  COVID TEST- n/a   Anesthesia review: Yes. Cardiac hx. Cardiac Clearance note in chart.  Patient denies shortness of breath, fever, cough and chest pain at PAT appointment   All instructions explained to the patient, with a verbal understanding of the material. Patient agrees to go over the instructions while at home for a better understanding. Patient also instructed to self quarantine after being tested for COVID-19. The opportunity to ask questions was provided.

## 2022-07-18 NOTE — Progress Notes (Signed)
Surgical Instructions    Your procedure is scheduled on Friday August 25th.  Report to [facility] Main Entrance "A" at 5:30 A.M., then check in with the Admitting office.  Call this number if you have problems the morning of surgery:  [phone]   If you have any questions prior to your surgery date call [phone]: Open Monday-Friday 8am-4pm    Remember:  Do not eat or drink after midnight the night before your surgery     Take these medicines the morning of surgery with A SIP OF WATER:  diltiazem (CARDIZEM CD)  dofetilide (TIKOSYN)  levothyroxine (SYNTHROID)   acetaminophen (TYLENOL) - may take as needed   STOP taking apixaban Arne Cleveland) July 19, 2022. Your last dose of this medication will be July 18, 2022.   As of today, STOP taking any Aspirin (unless otherwise instructed by your surgeon) Aleve, Naproxen, Ibuprofen, Motrin, Advil, Goody's, BC's, all herbal medications, fish oil, and all vitamins.           [facility] [facility] is not responsible for any belongings or valuables.    Do NOT Smoke (Tobacco/Vaping)  24 hours prior to your procedure  If you use a CPAP at night, you may bring your mask for your overnight stay.   Contacts, glasses, hearing aids, dentures or partials may not be worn into surgery, please bring cases for these belongings   For patients admitted to the hospital, discharge time will be determined by your treatment team.   Patients discharged the day of surgery will not be allowed to drive home, and someone needs to stay with them for 24 hours.   SURGICAL WAITING ROOM VISITATION Patients having surgery or a procedure may have no more than 2 support people in the waiting area - these visitors may rotate.   Children under the age of 56 must have an adult with them who is not the patient. If the patient needs to stay at the hospital during part of their recovery, the visitor guidelines for inpatient rooms apply. Pre-op nurse will coordinate an  appropriate time for 1 support person to accompany patient in pre-op.  This support person may not rotate.   Please refer to the [facility] website for the visitor guidelines for Inpatients (after your surgery is over and you are in a regular room).    Special instructions:    Oral Hygiene is also important to reduce your risk of infection.  Remember - BRUSH YOUR TEETH THE MORNING OF SURGERY WITH YOUR REGULAR TOOTHPASTE   [facility]- Preparing For Surgery  Before surgery, you can play an important role. Because skin is not sterile, your skin needs to be as free of germs as possible. You can reduce the number of germs on your skin by washing with CHG (chlorahexidine gluconate) Soap before surgery.  CHG is an antiseptic cleaner which kills germs and bonds with the skin to continue killing germs even after washing.     Please do not use if you have an allergy to CHG or antibacterial soaps. If your skin becomes reddened/irritated stop using the CHG.  Do not shave (including legs and underarms) for at least 48 hours prior to first CHG shower. It is OK to shave your face.  Please follow these instructions carefully.     Shower the NIGHT BEFORE SURGERY and the MORNING OF SURGERY with CHG Soap.   If you chose to wash your hair, wash your hair first as usual with your normal shampoo. After you shampoo, rinse  your hair and body thoroughly to remove the shampoo.  Then [employer] and genitals (private parts) with your normal soap and rinse thoroughly to remove soap.  After that Use CHG Soap as you would any other liquid soap. You can apply CHG directly to the skin and wash gently with a scrungie or a clean washcloth.   Apply the CHG Soap to your body ONLY FROM THE NECK DOWN.  Do not use on open wounds or open sores. Avoid contact with your eyes, ears, mouth and genitals (private parts). Wash Face and genitals (private parts)  with your normal soap.   Wash thoroughly, paying special attention to the  area where your surgery will be performed.  Thoroughly rinse your body with warm water from the neck down.  DO NOT shower/wash with your normal soap after using and rinsing off the CHG Soap.  Pat yourself dry with a CLEAN TOWEL.  Wear CLEAN PAJAMAS to bed the night before surgery  Place CLEAN SHEETS on your bed the night before your surgery  DO NOT SLEEP WITH PETS.   Day of Surgery: Take a shower with CHG soap. Do not wear jewelry or makeup. Do not wear lotions, powders, perfumes or deodorant. Do not shave 48 hours prior to surgery.   Do not bring valuables to the hospital. Do not wear nail polish, gel polish, artificial nails, or any other type of covering on natural nails (fingers and toes) If you have artificial nails or gel coating that need to be removed by a nail salon, please have this removed prior to surgery. Artificial nails or gel coating may interfere with anesthesia's ability to adequately monitor your vital signs. Wear Clean/Comfortable clothing the morning of surgery Remember to brush your teeth WITH YOUR REGULAR TOOTHPASTE.    If you received a COVID test during your pre-op visit, it is requested that you wear a mask when out in public, stay away from anyone that may not be feeling well, and notify your surgeon if you develop symptoms. If you have been in contact with anyone that has tested positive in the last 10 days, please notify your surgeon.    Please read over the following fact sheets that you were given.

## 2022-07-19 NOTE — Anesthesia Preprocedure Evaluation (Addendum)
Anesthesia Evaluation  Patient identified by MRN, date of birth, ID band Patient awake    Reviewed: Allergy & Precautions, NPO status , Patient's Chart, lab work & pertinent test results  History of Anesthesia Complications Negative for: history of anesthetic complications  Airway Mallampati: II  TM Distance: >3 FB Neck ROM: Full    Dental  (+) Dental Advisory Given, Teeth Intact   Pulmonary neg pulmonary ROS,    breath sounds clear to auscultation       Cardiovascular hypertension, Pt. on medications (-) angina+ dysrhythmias Atrial Fibrillation  Rhythm:Regular Rate:Normal  '22 ECHO: EF 50-55%, low normal LVF, normal RVF, no significant valvular abnormalities   Neuro/Psych Back pain    GI/Hepatic negative GI ROS, Neg liver ROS,   Endo/Other  Hypothyroidism Morbid obesity  Renal/GU negative Renal ROS     Musculoskeletal   Abdominal (+) + obese,   Peds  Hematology Eliquis: last dose Monday   Anesthesia Other Findings   Reproductive/Obstetrics                            Anesthesia Physical Anesthesia Plan  ASA: 3  Anesthesia Plan: General   Post-op Pain Management: Tylenol PO (pre-op)*   Induction: Intravenous  PONV Risk Score and Plan: Ondansetron, Dexamethasone and Scopolamine patch - Pre-op  Airway Management Planned: Oral ETT  Additional Equipment: None  Intra-op Plan:   Post-operative Plan: Extubation in OR  Informed Consent: I have reviewed the patients History and Physical, chart, labs and discussed the procedure including the risks, benefits and alternatives for the proposed anesthesia with the patient or authorized representative who has indicated his/her understanding and acceptance.     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  Cardiac clearance per telephone encounter 06/16/2022, "Chart reviewed as part of pre-operative protocol coverage. Patient was contacted7/28/2023in reference to pre-operative risk assessment for pending surgery as outlined below. Emily Nicosia Kubrickwas last seen on 7/10/2023by Cecilie Kicks, NP. Since that day, Emily Nunez done well from a cardiac standpoint. She denies any new symptoms or concerns. She is able to complete greater than 4 METS without difficulty. Therefore, based on ACC/AHA guidelines, the patient would be at acceptable risk for the planned procedure without further cardiovascular testing." She was also cleared to hold Eliquis 3 days prior to procedure.  Reports last dose Eliquis 07/18/2022.  Preop labs reviewed, WNL.  EKG 06/06/2022: NSR.  Rate 80.  Possible LAE.  Low voltage QRS.  Cannot rule out anterior infarct, age undetermined.  TTE 08/04/2021: 1. Left ventricular ejection fraction, by estimation, is 50 to 55%. The  left ventricle has low normal function. The left ventricle has no regional  wall motion abnormalities. Left ventricular diastolic parameters are  indeterminate.  2. Right ventricular systolic function is normal. The right ventricular  size is normal. There is normal pulmonary artery systolic pressure. The  estimated right ventricular systolic pressure is 97.9 mmHg.  3. The mitral valve is normal in structure. Trivial mitral valve  regurgitation. No evidence of mitral stenosis.  4. The aortic valve is normal in structure. Aortic valve regurgitation is  trivial. No aortic stenosis is present.  5. The inferior vena cava is normal in size with greater than 50%  respiratory variability, suggesting right atrial pressure of 3 mmHg.   )  Anesthesia Quick Evaluation  

## 2022-07-19 NOTE — Progress Notes (Signed)
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  Cardiac clearance per telephone encounter 06/16/2022, "Chart reviewed as part of pre-operative protocol coverage. Patient was contacted 06/24/2022 in reference to pre-operative risk assessment for pending surgery as outlined below.  Emily Nunez was last seen on 06/06/2022 by Cecilie Kicks, NP.  Since that day, Emily Nunez has done well from a cardiac standpoint.  She denies any new symptoms or concerns.  She is able to complete greater than 4 METS without difficulty. Therefore, based on ACC/AHA guidelines, the patient would be at acceptable risk for the planned procedure without further cardiovascular testing." She was also cleared to hold Eliquis 3 days prior to procedure.  Reports last dose Eliquis 07/18/2022.  Preop labs reviewed, WNL.  EKG 06/06/2022: NSR.  Rate 80.  Possible LAE.  Low voltage QRS.  Cannot rule out anterior infarct, age undetermined.  TTE 08/04/2021:  1. Left ventricular ejection fraction, by estimation, is 50 to 55%. The  left ventricle has low normal function. The left ventricle has no regional  wall motion abnormalities. Left ventricular diastolic parameters are  indeterminate.   2. Right ventricular systolic function is normal. The right ventricular  size is normal. There is normal pulmonary artery systolic pressure. The  estimated right ventricular systolic pressure is 59.1 mmHg.   3. The mitral valve is normal in structure. Trivial mitral valve  regurgitation. No evidence of mitral stenosis.   4. The aortic valve is normal in structure. Aortic valve regurgitation is  trivial. No aortic stenosis is present.   5. The inferior vena cava is normal in size with greater than 50%  respiratory variability, suggesting right atrial pressure of 3 mmHg.     Emily Nunez [facility] Short Stay Center/Anesthesiology Phone [phone] 07/19/2022 11:29 AM

## 2022-07-22 ENCOUNTER — Other Ambulatory Visit: Payer: Self-pay

## 2022-07-22 ENCOUNTER — Ambulatory Visit (HOSPITAL_COMMUNITY): Payer: BC Managed Care – PPO | Admitting: Physician Assistant

## 2022-07-22 ENCOUNTER — Ambulatory Visit (HOSPITAL_COMMUNITY)
Admission: RE | Disposition: A | Discharge status: Home / Self Care | Payer: Self-pay | Source: Home / Self Care | Attending: Neurosurgery

## 2022-07-22 ENCOUNTER — Observation Stay (HOSPITAL_COMMUNITY)
Admission: RE | Admit: 2022-07-22 | Discharge: 2022-07-23 | Disposition: A | Discharge status: Home / Self Care | Payer: BC Managed Care – PPO | Attending: Neurosurgery | Admitting: Neurosurgery

## 2022-07-22 ENCOUNTER — Ambulatory Visit (HOSPITAL_COMMUNITY): Payer: BC Managed Care – PPO

## 2022-07-22 ENCOUNTER — Ambulatory Visit (HOSPITAL_COMMUNITY): Payer: BC Managed Care – PPO | Admitting: Anesthesiology

## 2022-07-22 ENCOUNTER — Encounter (HOSPITAL_COMMUNITY): Payer: Self-pay | Admitting: Neurosurgery

## 2022-07-22 DIAGNOSIS — E039 Hypothyroidism, unspecified: Secondary | ICD-10-CM | POA: Diagnosis not present

## 2022-07-22 DIAGNOSIS — M48062 Spinal stenosis, lumbar region with neurogenic claudication: Principal | ICD-10-CM | POA: Insufficient documentation

## 2022-07-22 DIAGNOSIS — Z85828 Personal history of other malignant neoplasm of skin: Secondary | ICD-10-CM | POA: Diagnosis not present

## 2022-07-22 DIAGNOSIS — Z7901 Long term (current) use of anticoagulants: Secondary | ICD-10-CM | POA: Diagnosis not present

## 2022-07-22 DIAGNOSIS — I4891 Unspecified atrial fibrillation: Secondary | ICD-10-CM | POA: Diagnosis not present

## 2022-07-22 DIAGNOSIS — M4316 Spondylolisthesis, lumbar region: Secondary | ICD-10-CM | POA: Insufficient documentation

## 2022-07-22 DIAGNOSIS — I4819 Other persistent atrial fibrillation: Secondary | ICD-10-CM

## 2022-07-22 DIAGNOSIS — Z79899 Other long term (current) drug therapy: Secondary | ICD-10-CM | POA: Diagnosis not present

## 2022-07-22 HISTORY — PX: SPINAL FUSION: SHX223

## 2022-07-22 HISTORY — PX: MAXIMUM ACCESS (MAS)POSTERIOR LUMBAR INTERBODY FUSION (PLIF) 3 LEVEL: SHX6370

## 2022-07-22 LAB — ABO/RH: ABO/RH(D): O POS

## 2022-07-22 SURGERY — POSTERIOR LUMBAR FUSION 1 LEVEL
Anesthesia: General

## 2022-07-22 MED ORDER — 0.9 % SODIUM CHLORIDE (POUR BTL) OPTIME
TOPICAL | Status: DC | PRN
  Administered 2022-07-22: 1000 mL

## 2022-07-22 MED ORDER — MIDAZOLAM HCL 2 MG/2ML IJ SOLN
0.5000 mg | Freq: Once | INTRAMUSCULAR | Status: AC | PRN
  Administered 2022-07-22: 0.5 mg via INTRAVENOUS

## 2022-07-22 MED ORDER — OMEGA 3 1000 MG PO CAPS: ORAL_CAPSULE | Freq: Every day | ORAL | Status: DC

## 2022-07-22 MED ORDER — OXYCODONE HCL 5 MG/5ML PO SOLN: 5.0000 mg | Freq: Once | ORAL | Status: AC | PRN

## 2022-07-22 MED ORDER — ONDANSETRON HCL 4 MG PO TABS: 4.0000 mg | ORAL_TABLET | Freq: Four times a day (QID) | ORAL | Status: DC | PRN

## 2022-07-22 MED ORDER — PHENYLEPHRINE 80 MCG/ML (10ML) SYRINGE FOR IV PUSH (FOR BLOOD PRESSURE SUPPORT)
PREFILLED_SYRINGE | INTRAVENOUS | Status: DC | PRN
  Administered 2022-07-22: 160 ug via INTRAVENOUS
  Administered 2022-07-22 (×3): 80 ug via INTRAVENOUS

## 2022-07-22 MED ORDER — PHENOL 1.4 % MT LIQD: 1.0000 | OROMUCOSAL | Status: DC | PRN

## 2022-07-22 MED ORDER — MIDAZOLAM HCL 2 MG/2ML IJ SOLN
INTRAMUSCULAR | Status: AC
Start: 2022-07-22 — End: ?
  Filled 2022-07-22: qty 2

## 2022-07-22 MED ORDER — SODIUM CHLORIDE 0.9 % IV SOLN: INTRAVENOUS | Status: DC

## 2022-07-22 MED ORDER — EPHEDRINE 5 MG/ML INJ
INTRAVENOUS | Status: AC
Start: 2022-07-22 — End: ?
  Filled 2022-07-22: qty 5

## 2022-07-22 MED ORDER — BUPIVACAINE HCL (PF) 0.5 % IJ SOLN
INTRAMUSCULAR | Status: DC | PRN
  Administered 2022-07-22: 5 mL

## 2022-07-22 MED ORDER — CHLORHEXIDINE GLUCONATE CLOTH 2 % EX PADS: 6.0000 | MEDICATED_PAD | Freq: Once | CUTANEOUS | Status: DC

## 2022-07-22 MED ORDER — PROPOFOL 10 MG/ML IV BOLUS
INTRAVENOUS | Status: DC | PRN
  Administered 2022-07-22: 20 mg via INTRAVENOUS
  Administered 2022-07-22: 150 mg via INTRAVENOUS
  Administered 2022-07-22: 20 mg via INTRAVENOUS

## 2022-07-22 MED ORDER — LIDOCAINE-EPINEPHRINE 1 %-1:100000 IJ SOLN
INTRAMUSCULAR | Status: AC
  Filled 2022-07-22: qty 1

## 2022-07-22 MED ORDER — ONDANSETRON HCL 4 MG/2ML IJ SOLN
INTRAMUSCULAR | Status: DC | PRN
  Administered 2022-07-22: 4 mg via INTRAVENOUS

## 2022-07-22 MED ORDER — OXYCODONE HCL 5 MG PO TABS
ORAL_TABLET | ORAL | Status: AC
  Filled 2022-07-22: qty 1

## 2022-07-22 MED ORDER — PROPOFOL 10 MG/ML IV BOLUS
INTRAVENOUS | Status: AC
  Filled 2022-07-22: qty 20

## 2022-07-22 MED ORDER — LIDOCAINE 2% (20 MG/ML) 5 ML SYRINGE
INTRAMUSCULAR | Status: DC | PRN
  Administered 2022-07-22: 40 mg via INTRAVENOUS

## 2022-07-22 MED ORDER — METOPROLOL TARTRATE 5 MG/5ML IV SOLN
INTRAVENOUS | Status: AC
  Filled 2022-07-22: qty 5

## 2022-07-22 MED ORDER — ACETAMINOPHEN 500 MG PO TABS
1000.0000 mg | ORAL_TABLET | Freq: Once | ORAL | Status: AC
Start: 2022-07-22 — End: 2022-07-22
  Administered 2022-07-22: 1000 mg via ORAL
  Filled 2022-07-22: qty 2

## 2022-07-22 MED ORDER — LIDOCAINE 2% (20 MG/ML) 5 ML SYRINGE
INTRAMUSCULAR | Status: AC
  Filled 2022-07-22: qty 5

## 2022-07-22 MED ORDER — BISACODYL 10 MG RE SUPP: 10.0000 mg | Freq: Every day | RECTAL | Status: DC | PRN

## 2022-07-22 MED ORDER — VITAMIN D (ERGOCALCIFEROL) 1.25 MG (50000 UNIT) PO CAPS: 50000.0000 [IU] | ORAL_CAPSULE | ORAL | Status: DC

## 2022-07-22 MED ORDER — PHENYLEPHRINE HCL-NACL 20-0.9 MG/250ML-% IV SOLN
INTRAVENOUS | Status: DC | PRN
  Administered 2022-07-22: 50 ug/min via INTRAVENOUS

## 2022-07-22 MED ORDER — SODIUM CHLORIDE 0.9% FLUSH
3.0000 mL | Freq: Two times a day (BID) | INTRAVENOUS | Status: DC
Start: 2022-07-22 — End: 2022-07-23
  Administered 2022-07-22: 3 mL via INTRAVENOUS

## 2022-07-22 MED ORDER — ROCURONIUM BROMIDE 10 MG/ML (PF) SYRINGE
PREFILLED_SYRINGE | INTRAVENOUS | Status: AC
  Filled 2022-07-22: qty 10

## 2022-07-22 MED ORDER — MIDAZOLAM HCL 5 MG/5ML IJ SOLN
INTRAMUSCULAR | Status: DC | PRN
  Administered 2022-07-22: 2 mg via INTRAVENOUS

## 2022-07-22 MED ORDER — LIDOCAINE 4 % EX PTCH: 1.0000 | MEDICATED_PATCH | Freq: Every day | CUTANEOUS | Status: DC | PRN

## 2022-07-22 MED ORDER — DEXAMETHASONE SODIUM PHOSPHATE 10 MG/ML IJ SOLN
INTRAMUSCULAR | Status: DC | PRN
  Administered 2022-07-22: 10 mg via INTRAVENOUS

## 2022-07-22 MED ORDER — ACETAMINOPHEN 500 MG PO TABS
1000.0000 mg | ORAL_TABLET | Freq: Once | ORAL | Status: AC
Start: 2022-07-22 — End: 2022-07-22
  Administered 2022-07-22: 1000 mg via ORAL

## 2022-07-22 MED ORDER — DILTIAZEM HCL ER COATED BEADS 120 MG PO CP24
240.0000 mg | ORAL_CAPSULE | Freq: Every day | ORAL | Status: DC
  Administered 2022-07-22 – 2022-07-23 (×2): 240 mg via ORAL
  Filled 2022-07-22 (×2): qty 2

## 2022-07-22 MED ORDER — HYDROMORPHONE HCL 1 MG/ML IJ SOLN
0.2500 mg | INTRAMUSCULAR | Status: DC | PRN
  Administered 2022-07-22 (×4): 0.5 mg via INTRAVENOUS

## 2022-07-22 MED ORDER — DEXAMETHASONE SODIUM PHOSPHATE 10 MG/ML IJ SOLN
INTRAMUSCULAR | Status: AC
  Filled 2022-07-22: qty 1

## 2022-07-22 MED ORDER — ACETAMINOPHEN 500 MG PO TABS
ORAL_TABLET | ORAL | Status: AC
  Filled 2022-07-22: qty 2

## 2022-07-22 MED ORDER — LEVOTHYROXINE SODIUM 75 MCG PO TABS
150.0000 ug | ORAL_TABLET | Freq: Every day | ORAL | Status: DC
Start: 2022-07-23 — End: 2022-07-23
  Administered 2022-07-23: 150 ug via ORAL
  Filled 2022-07-22: qty 2

## 2022-07-22 MED ORDER — PROMETHAZINE HCL 25 MG/ML IJ SOLN: 6.2500 mg | INTRAMUSCULAR | Status: DC | PRN

## 2022-07-22 MED ORDER — THROMBIN 5000 UNITS EX SOLR
OROMUCOSAL | Status: DC | PRN
  Administered 2022-07-22 (×2): 5 mL via TOPICAL

## 2022-07-22 MED ORDER — SODIUM CHLORIDE 0.9 % IV SOLN: 250.0000 mL | INTRAVENOUS | Status: DC

## 2022-07-22 MED ORDER — PANTOPRAZOLE SODIUM 40 MG IV SOLR
40.0000 mg | Freq: Every day | INTRAVENOUS | Status: DC
  Administered 2022-07-22: 40 mg via INTRAVENOUS
  Filled 2022-07-22: qty 10

## 2022-07-22 MED ORDER — MIDAZOLAM HCL 2 MG/2ML IJ SOLN
INTRAMUSCULAR | Status: AC
  Filled 2022-07-22: qty 2

## 2022-07-22 MED ORDER — CHLORHEXIDINE GLUCONATE 0.12 % MT SOLN
15.0000 mL | Freq: Once | OROMUCOSAL | Status: AC
  Administered 2022-07-22: 15 mL via OROMUCOSAL
  Filled 2022-07-22: qty 15

## 2022-07-22 MED ORDER — POTASSIUM CHLORIDE CRYS ER 20 MEQ PO TBCR
20.0000 meq | EXTENDED_RELEASE_TABLET | Freq: Every day | ORAL | Status: DC
  Administered 2022-07-22 – 2022-07-23 (×2): 20 meq via ORAL
  Filled 2022-07-22 (×2): qty 1

## 2022-07-22 MED ORDER — HYDROMORPHONE HCL 1 MG/ML IJ SOLN
INTRAMUSCULAR | Status: AC
  Filled 2022-07-22: qty 1

## 2022-07-22 MED ORDER — DOFETILIDE 500 MCG PO CAPS
500.0000 ug | ORAL_CAPSULE | Freq: Two times a day (BID) | ORAL | Status: DC
  Administered 2022-07-22 – 2022-07-23 (×2): 500 ug via ORAL
  Filled 2022-07-22 (×3): qty 1

## 2022-07-22 MED ORDER — ACETAMINOPHEN 325 MG PO TABS
650.0000 mg | ORAL_TABLET | ORAL | Status: DC | PRN
  Administered 2022-07-23: 650 mg via ORAL
  Filled 2022-07-22: qty 2

## 2022-07-22 MED ORDER — ORAL CARE MOUTH RINSE: 15.0000 mL | Freq: Once | OROMUCOSAL | Status: AC

## 2022-07-22 MED ORDER — PHENYLEPHRINE 80 MCG/ML (10ML) SYRINGE FOR IV PUSH (FOR BLOOD PRESSURE SUPPORT)
PREFILLED_SYRINGE | INTRAVENOUS | Status: AC
Start: 2022-07-22 — End: ?
  Filled 2022-07-22: qty 10

## 2022-07-22 MED ORDER — OXYCODONE HCL 5 MG PO TABS: 5.0000 mg | ORAL_TABLET | ORAL | Status: DC | PRN

## 2022-07-22 MED ORDER — METOPROLOL TARTRATE 5 MG/5ML IV SOLN
5.0000 mg | Freq: Once | INTRAVENOUS | Status: AC
  Administered 2022-07-22: 5 mg via INTRAVENOUS

## 2022-07-22 MED ORDER — B-12 500 MCG PO TABS: ORAL_TABLET | Freq: Every day | ORAL | Status: DC

## 2022-07-22 MED ORDER — DOCUSATE SODIUM 100 MG PO CAPS
100.0000 mg | ORAL_CAPSULE | Freq: Two times a day (BID) | ORAL | Status: DC
  Administered 2022-07-22 – 2022-07-23 (×2): 100 mg via ORAL
  Filled 2022-07-22 (×2): qty 1

## 2022-07-22 MED ORDER — ACETAMINOPHEN 650 MG RE SUPP: 650.0000 mg | RECTAL | Status: DC | PRN

## 2022-07-22 MED ORDER — FENTANYL CITRATE (PF) 250 MCG/5ML IJ SOLN
INTRAMUSCULAR | Status: AC
  Filled 2022-07-22: qty 5

## 2022-07-22 MED ORDER — CEFAZOLIN SODIUM-DEXTROSE 2-4 GM/100ML-% IV SOLN
2.0000 g | Freq: Three times a day (TID) | INTRAVENOUS | Status: AC
  Administered 2022-07-22 – 2022-07-23 (×2): 2 g via INTRAVENOUS
  Filled 2022-07-22 (×2): qty 100

## 2022-07-22 MED ORDER — METHOCARBAMOL 1000 MG/10ML IJ SOLN: 500.0000 mg | Freq: Four times a day (QID) | INTRAVENOUS | Status: DC | PRN

## 2022-07-22 MED ORDER — MORPHINE SULFATE (PF) 2 MG/ML IV SOLN: 2.0000 mg | INTRAVENOUS | Status: DC | PRN

## 2022-07-22 MED ORDER — LACTATED RINGERS IV SOLN: INTRAVENOUS | Status: DC

## 2022-07-22 MED ORDER — LIDOCAINE-EPINEPHRINE 1 %-1:100000 IJ SOLN
INTRAMUSCULAR | Status: DC | PRN
  Administered 2022-07-22: 5 mL

## 2022-07-22 MED ORDER — OXYCODONE HCL 5 MG PO TABS
10.0000 mg | ORAL_TABLET | ORAL | Status: DC | PRN
  Administered 2022-07-22 – 2022-07-23 (×6): 10 mg via ORAL
  Filled 2022-07-22 (×6): qty 2

## 2022-07-22 MED ORDER — SENNA 8.6 MG PO TABS
1.0000 | ORAL_TABLET | Freq: Two times a day (BID) | ORAL | Status: DC
  Administered 2022-07-22 – 2022-07-23 (×2): 8.6 mg via ORAL
  Filled 2022-07-22 (×2): qty 1

## 2022-07-22 MED ORDER — METHOCARBAMOL 500 MG PO TABS
500.0000 mg | ORAL_TABLET | Freq: Four times a day (QID) | ORAL | Status: DC | PRN
  Administered 2022-07-22 – 2022-07-23 (×2): 500 mg via ORAL
  Filled 2022-07-22 (×2): qty 1

## 2022-07-22 MED ORDER — MENTHOL 3 MG MT LOZG: 1.0000 | LOZENGE | OROMUCOSAL | Status: DC | PRN

## 2022-07-22 MED ORDER — EPHEDRINE SULFATE-NACL 50-0.9 MG/10ML-% IV SOSY
PREFILLED_SYRINGE | INTRAVENOUS | Status: DC | PRN
  Administered 2022-07-22 (×3): 5 mg via INTRAVENOUS
  Administered 2022-07-22: 10 mg via INTRAVENOUS

## 2022-07-22 MED ORDER — OXYCODONE HCL 5 MG PO TABS
5.0000 mg | ORAL_TABLET | Freq: Once | ORAL | Status: AC | PRN
  Administered 2022-07-22: 5 mg via ORAL

## 2022-07-22 MED ORDER — HYDROMORPHONE HCL 1 MG/ML IJ SOLN
0.2500 mg | INTRAMUSCULAR | Status: DC | PRN
  Administered 2022-07-22: 0.25 mg via INTRAVENOUS

## 2022-07-22 MED ORDER — THROMBIN 5000 UNITS EX SOLR
CUTANEOUS | Status: AC
  Filled 2022-07-22: qty 5000

## 2022-07-22 MED ORDER — ROCURONIUM BROMIDE 10 MG/ML (PF) SYRINGE
PREFILLED_SYRINGE | INTRAVENOUS | Status: DC | PRN
  Administered 2022-07-22: 20 mg via INTRAVENOUS
  Administered 2022-07-22: 70 mg via INTRAVENOUS

## 2022-07-22 MED ORDER — MEPERIDINE HCL 25 MG/ML IJ SOLN: 6.2500 mg | INTRAMUSCULAR | Status: DC | PRN

## 2022-07-22 MED ORDER — ONDANSETRON HCL 4 MG/2ML IJ SOLN
INTRAMUSCULAR | Status: AC
Start: 2022-07-22 — End: ?
  Filled 2022-07-22: qty 2

## 2022-07-22 MED ORDER — SODIUM CHLORIDE 0.9% FLUSH: 3.0000 mL | INTRAVENOUS | Status: DC | PRN

## 2022-07-22 MED ORDER — EPHEDRINE 5 MG/ML INJ
INTRAVENOUS | Status: AC
  Filled 2022-07-22: qty 5

## 2022-07-22 MED ORDER — ZOLPIDEM TARTRATE 5 MG PO TABS
5.0000 mg | ORAL_TABLET | Freq: Every day | ORAL | Status: DC
  Administered 2022-07-22: 5 mg via ORAL
  Filled 2022-07-22: qty 1

## 2022-07-22 MED ORDER — LACTATED RINGERS IV SOLN: INTRAVENOUS | Status: DC | PRN

## 2022-07-22 MED ORDER — BUPIVACAINE HCL (PF) 0.5 % IJ SOLN
INTRAMUSCULAR | Status: AC
Start: 2022-07-22 — End: ?
  Filled 2022-07-22: qty 30

## 2022-07-22 MED ORDER — SUGAMMADEX SODIUM 200 MG/2ML IV SOLN
INTRAVENOUS | Status: DC | PRN
  Administered 2022-07-22: 200 mg via INTRAVENOUS

## 2022-07-22 MED ORDER — SCOPOLAMINE 1 MG/3DAYS TD PT72
1.0000 | MEDICATED_PATCH | TRANSDERMAL | Status: DC
  Administered 2022-07-22: 1.5 mg via TRANSDERMAL
  Filled 2022-07-22: qty 1

## 2022-07-22 MED ORDER — ONDANSETRON HCL 4 MG/2ML IJ SOLN: 4.0000 mg | Freq: Four times a day (QID) | INTRAMUSCULAR | Status: DC | PRN

## 2022-07-22 MED ORDER — CEFAZOLIN SODIUM-DEXTROSE 2-4 GM/100ML-% IV SOLN
2.0000 g | INTRAVENOUS | Status: AC
  Administered 2022-07-22: 2 g via INTRAVENOUS
  Filled 2022-07-22: qty 100

## 2022-07-22 MED ORDER — FENTANYL CITRATE (PF) 250 MCG/5ML IJ SOLN
INTRAMUSCULAR | Status: DC | PRN
  Administered 2022-07-22: 50 ug via INTRAVENOUS
  Administered 2022-07-22: 150 ug via INTRAVENOUS
  Administered 2022-07-22 (×3): 50 ug via INTRAVENOUS

## 2022-07-22 SURGICAL SUPPLY — 88 items
ADH SKN CLS APL DERMABOND .7 (GAUZE/BANDAGES/DRESSINGS) ×1
APL SKNCLS STERI-STRIP NONHPOA (GAUZE/BANDAGES/DRESSINGS)
BAG COUNTER SPONGE SURGICOUNT (BAG) ×1 IMPLANT
BAG SPNG CNTER NS LX DISP (BAG) ×1
BASKET BONE COLLECTION (BASKET) ×1 IMPLANT
BENZOIN TINCTURE PRP APPL 2/3 (GAUZE/BANDAGES/DRESSINGS) IMPLANT
BLADE BONE MILL MEDIUM (MISCELLANEOUS) IMPLANT
BLADE CLIPPER SURG (BLADE) IMPLANT
BLADE SURG 11 STRL SS (BLADE) ×1 IMPLANT
BUR 14 MATCH 3 (BUR) IMPLANT
BUR MATCHSTICK NEURO 3.0 LAGG (BURR) ×1 IMPLANT
BUR MR8 14CM BALL SYMTRI 5 (BUR) IMPLANT
BUR PRECISION FLUTE 5.0 (BURR) ×1 IMPLANT
BURR 14 MATCH 3 (BUR) ×1
BURR MR8 14CM BALL SYMTRI 5 (BUR) ×1
CAGE INTERBODY PL SHT 7X22.5 (Plate) IMPLANT
CANISTER SUCT 3000ML PPV (MISCELLANEOUS) ×1 IMPLANT
CARTRIDGE OIL MAESTRO DRILL (MISCELLANEOUS) ×1 IMPLANT
CNTNR URN SCR LID CUP LEK RST (MISCELLANEOUS) ×1 IMPLANT
CONT SPEC 4OZ STRL OR WHT (MISCELLANEOUS) ×1
COVER BACK TABLE 60X90IN (DRAPES) ×4 IMPLANT
DERMABOND ADVANCED (GAUZE/BANDAGES/DRESSINGS) ×1
DERMABOND ADVANCED .7 DNX12 (GAUZE/BANDAGES/DRESSINGS) ×1 IMPLANT
DIFFUSER DRILL AIR PNEUMATIC (MISCELLANEOUS) ×1 IMPLANT
DRAPE 3/4 80X56 (DRAPES) IMPLANT
DRAPE C-ARM 42X72 X-RAY (DRAPES) ×1 IMPLANT
DRAPE C-ARMOR (DRAPES) ×1 IMPLANT
DRAPE LAPAROTOMY 100X72X124 (DRAPES) ×1 IMPLANT
DRAPE SCAN PATIENT (DRAPES) ×1 IMPLANT
DRAPE SURG 17X23 STRL (DRAPES) ×1 IMPLANT
DRSG OPSITE POSTOP 4X6 (GAUZE/BANDAGES/DRESSINGS) IMPLANT
DURAPREP 26ML APPLICATOR (WOUND CARE) ×1 IMPLANT
ELECT REM PT RETURN 9FT ADLT (ELECTROSURGICAL) ×1
ELECTRODE REM PT RTRN 9FT ADLT (ELECTROSURGICAL) ×1 IMPLANT
GAUZE 4X4 16PLY ~~LOC~~+RFID DBL (SPONGE) IMPLANT
GAUZE SPONGE 4X4 12PLY STRL (GAUZE/BANDAGES/DRESSINGS) IMPLANT
GLOVE BIO SURGEON STRL SZ7.5 (GLOVE) IMPLANT
GLOVE BIOGEL PI IND STRL 6.5 (GLOVE) IMPLANT
GLOVE BIOGEL PI IND STRL 7.0 (GLOVE) IMPLANT
GLOVE BIOGEL PI IND STRL 7.5 (GLOVE) ×2 IMPLANT
GLOVE BIOGEL PI INDICATOR 6.5 (GLOVE) ×3
GLOVE BIOGEL PI INDICATOR 7.0 (GLOVE) ×2
GLOVE BIOGEL PI INDICATOR 7.5 (GLOVE) ×5
GLOVE ECLIPSE 6.5 STRL STRAW (GLOVE) IMPLANT
GLOVE ECLIPSE 7.0 STRL STRAW (GLOVE) ×2 IMPLANT
GLOVE EXAM NITRILE XL STR (GLOVE) IMPLANT
GOWN STRL REUS W/ TWL LRG LVL3 (GOWN DISPOSABLE) ×4 IMPLANT
GOWN STRL REUS W/ TWL XL LVL3 (GOWN DISPOSABLE) IMPLANT
GOWN STRL REUS W/TWL 2XL LVL3 (GOWN DISPOSABLE) IMPLANT
GOWN STRL REUS W/TWL LRG LVL3 (GOWN DISPOSABLE) ×4
GOWN STRL REUS W/TWL XL LVL3 (GOWN DISPOSABLE) ×3
GRAFT BONE PROTEIOS XS 0.5CC (Orthopedic Implant) IMPLANT
HEMOSTAT POWDER KIT SURGIFOAM (HEMOSTASIS) ×1 IMPLANT
KIT BASIN OR (CUSTOM PROCEDURE TRAY) ×1 IMPLANT
KIT POSITION SURG JACKSON T1 (MISCELLANEOUS) ×1 IMPLANT
KIT TURNOVER KIT B (KITS) ×1 IMPLANT
MARKER SPHERE PSV REFLC NDI (MISCELLANEOUS) ×5 IMPLANT
MILL BONE PREP (MISCELLANEOUS) ×1 IMPLANT
NDL HYPO 18GX1.5 BLUNT FILL (NEEDLE) IMPLANT
NDL SPNL 18GX3.5 QUINCKE PK (NEEDLE) IMPLANT
NEEDLE HYPO 18GX1.5 BLUNT FILL (NEEDLE) IMPLANT
NEEDLE HYPO 22GX1.5 SAFETY (NEEDLE) ×1 IMPLANT
NEEDLE SPNL 18GX3.5 QUINCKE PK (NEEDLE) ×1 IMPLANT
NS IRRIG 1000ML POUR BTL (IV SOLUTION) ×1 IMPLANT
OIL CARTRIDGE MAESTRO DRILL (MISCELLANEOUS)
PACK LAMINECTOMY NEURO (CUSTOM PROCEDURE TRAY) ×1 IMPLANT
PAD ARMBOARD 7.5X6 YLW CONV (MISCELLANEOUS) ×3 IMPLANT
PIN BONE FIX 100 (PIN) IMPLANT
PUTTY GRAFTON DBF 6CC W/DELIVE (Putty) IMPLANT
ROD CC 30MM (Rod) IMPLANT
ROD COBALT 47.5X35 (Rod) IMPLANT
SCREW MA THRD TI 6.5X30 (Screw) IMPLANT
SCREW SET SOLERA (Screw) ×4 IMPLANT
SCREW SET SOLERA TI (Screw) IMPLANT
SCREW SOLERA 6.5X35 (Screw) IMPLANT
SPIKE FLUID TRANSFER (MISCELLANEOUS) ×1 IMPLANT
SPONGE SURGIFOAM ABS GEL 100 (HEMOSTASIS) IMPLANT
SPONGE T-LAP 4X18 ~~LOC~~+RFID (SPONGE) IMPLANT
STRIP CLOSURE SKIN 1/2X4 (GAUZE/BANDAGES/DRESSINGS) IMPLANT
SUT VIC AB 0 CT1 18XCR BRD8 (SUTURE) ×1 IMPLANT
SUT VIC AB 0 CT1 8-18 (SUTURE) ×2
SUT VICRYL 3-0 RB1 18 ABS (SUTURE) ×1 IMPLANT
SYR 3ML LL SCALE MARK (SYRINGE) ×3 IMPLANT
TOWEL GREEN STERILE (TOWEL DISPOSABLE) ×1 IMPLANT
TOWEL GREEN STERILE FF (TOWEL DISPOSABLE) ×1 IMPLANT
TRAY FOLEY MTR SLVR 14FR STAT (SET/KITS/TRAYS/PACK) IMPLANT
TRAY FOLEY MTR SLVR 16FR STAT (SET/KITS/TRAYS/PACK) ×1 IMPLANT
WATER STERILE IRR 1000ML POUR (IV SOLUTION) ×1 IMPLANT

## 2022-07-22 NOTE — Anesthesia Postprocedure Evaluation (Signed)
Anesthesia Post Note  Patient: Emily Nunez  Procedure(s) Performed: Posterior Lumbar Interbody Fusion ,IP,Posterior Nonsegmental Instrumentation, Lumbar four-five Application of O-Arm     Patient location during evaluation: PACU Anesthesia Type: General Level of consciousness: patient cooperative, oriented and sedated Pain management: pain level controlled Vital Signs Assessment: post-procedure vital signs reviewed and stable Respiratory status: spontaneous breathing, nonlabored ventilation, respiratory function stable and patient connected to nasal cannula oxygen Cardiovascular status: blood pressure returned to baseline and stable Postop Assessment: no apparent nausea or vomiting Anesthetic complications: no   No notable events documented.  Last Vitals:  Vitals:   07/22/22 1415 07/22/22 1430  BP: 120/65 114/67  Pulse: 73 68  Resp: 12 12  Temp:    SpO2: 93% 93%    Last Pain:  Vitals:   07/22/22 1430  TempSrc:   PainSc: Asleep                 Caedan Sumler,E. Jenner Rosier

## 2022-07-22 NOTE — Op Note (Signed)
NEUROSURGERY OPERATIVE NOTE   PREOP DIAGNOSIS:  1. Spondylolisthesis L4-5 2. 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 L4 laminectomy with facetectomy for decompression of exiting nerve roots, more than would be required for placement of interbody graft 2. Placement of anterior interbody device - Medtronic expandable 80m short cage x 2 3. Posterior non-segmental instrumentation using cortical pedicle screws at L4 - L5 - Medtronic Solera 6.5 x 35 x3, 6.5 x 30 x1 4. Interbody arthrodesis, L4-5 5. Use of locally harvested bone autograft 6. Use of non-structural bone allograft - DBM, BMP 7. Use of intraoperative stereotaxy for navigation  SURGEON: Dr. NConsuella Lose MD  ASSISTANT: Dr. JDuffy Rhody MD  ANESTHESIA: General Endotracheal  EBL: 300cc  SPECIMENS: None  DRAINS: None  COMPLICATIONS: None immediate  CONDITION: Hemodynamically stable to PACU  HISTORY: Emily PAGLIAis a 61y.o. female who has been seen in the outpatient clinic with back and leg pain related to stenosis and spondylolisthesis at L4-5. Multiple conservative treatments were attempted without significant improvement and we ultimately elected to proceed with surgical decompression and fusion. Risks, benefits, and alternative treatments were reviewed in detail in the office. After all questions were answered, informed consent was obtained and witnessed.  PROCEDURE IN DETAIL: The patient was brought to the operating room via stretcher. After induction of general anesthesia, the patient was positioned on the operative table in the prone position. All pressure points were meticulously padded. Incision was then marked out and prepped and draped in the usual sterile fashion.  After timeout was conducted, skin was infiltrated with local anesthetic. Skin incision was then made sharply and Bovie electrocautery was used to dissect the subcutaneous tissue until the  lumbodorsal fascia was identified and incised. The muscle was then elevated in the subperiosteal plane and the L4 lamina and L4-5 facet complexes were identified. Lateral fluoroscopy was taken with a dissector in the L4-5 interspace to confirm our location.  Stab incision was made over the right posterior superior iliac spine and the reference array was tapped into place.  Patient was then draped and intraoperative CT scan was taken.  Self-retaining retractors were then placed and accuracy of the navigation instruments was checked and noted to be adequate.  Using the stereotactic system, cortical trajectory pedicle screw pilot holes were drilled using the high-speed navigated drill bilaterally at L4 and L5.  Pilot holes were checked with a sound and then tapped to 5.5 mm at both levels.  Bleeding was easily controlled with morselized Gelfoam with thrombin.  At this point attention was turned to decompression. Complete L4 laminectomy was completed with a high-speed drill and Kerrison punches.  Normal dura was identified.  A ball-tipped dissector was then used to identify the foramina bilaterally.  High-speed drill was used to cut across the pars interarticularis and the inferior articulating process of L4 was removed bilaterally.  The exiting nerve roots and the traversing nerve roots were then identified.  Kerrison punches were then used to further remove the medial and superior aspects of the L5 superior articulating process in order to fully decompress the L4-5 foramina.  Once this was done I was able to directly visualize bilateral L4 and traversing L5 nerve roots indicating good decompression.  Disc space was then identified, incised bilaterally, and using a combination of shavers, curettes and rongeurs, complete discectomy was completed. Endplates were prepared, and bone harvested during decompression was mixed with allograft and BMP and packed into the interspace. A 7  mm short expandable cage was tapped  into place bilaterally under navigation guidance. cages were expanded to achieve good endplate apposition.utilizing the previously drilled pilot holes, 6.5 millimeter screws were placed bilaterally at L4 and L5.  Pre-bent lordotic rod was then sized and placed into the pedicle screws.  Setscrews were placed and final tightened.  Final AP and lateral fluoroscopic images were taken confirming good location of the implanted hardware and good lumbar alignment.  Hemostasis was secured and confirmed with bipolar cautery and morcellized gelfoam with thrombin. The wound was then irrigated with copious amounts of antibiotic saline, then closed in standard fashion using a combination of interrupted 0 and 3-0 Vicryl stitches in the muscular, fascial, and subcutaneous layers. Skin was then closed using standard Dermabond.  The reference array was removed from the posterior superior iliac spine and the small stab incision closed with a single subcuticular stitch.  Sterile dressing was then applied. The patient was then transferred to the stretcher, extubated, and taken to the postanesthesia care unit in stable hemodynamic condition.  At the end of the case all sponge, needle, cottonoid, and instrument counts were correct.   Consuella Lose, MD [facility]

## 2022-07-22 NOTE — Progress Notes (Signed)
Orthopedic Tech Progress Note Patient Details:  Emily Nunez October 05, 1961 [id number]  Ortho Devices Type of Ortho Device: Lumbar corsett Ortho Device/Splint Interventions: Ordered      Danton Sewer A Sabastion Hrdlicka 07/22/2022, 5:01 PM

## 2022-07-22 NOTE — Transfer of Care (Signed)
Immediate Anesthesia Transfer of Care Note  Patient: Emily Nunez  Procedure(s) Performed: Posterior Lumbar Interbody Fusion ,IP,Posterior Nonsegmental Instrumentation, Lumbar four-five Application of O-Arm  Patient Location: PACU  Anesthesia Type:General  Level of Consciousness: oriented, drowsy and patient cooperative  Airway & Oxygen Therapy: Patient Spontanous Breathing and Patient connected to face mask oxygen  Post-op Assessment: Report given to RN and Post -op Vital signs reviewed and stable  Post vital signs: Reviewed  Last Vitals:  Vitals Value Taken Time  BP 114/70 07/22/22 1230  Temp    Pulse 82 07/22/22 1233  Resp 10 07/22/22 1233  SpO2 98 % 07/22/22 1233  Vitals shown include unvalidated device data.  Last Pain:  Vitals:   07/22/22 0617  TempSrc:   PainSc: 0-No pain         Complications: No notable events documented.

## 2022-07-22 NOTE — Anesthesia Procedure Notes (Signed)
Procedure Name: Intubation Date/Time: 07/22/2022 8:28 AM  Performed by: Jenne Campus, CRNAPre-anesthesia Checklist: Patient identified, Emergency Drugs available, Suction available and Patient being monitored Patient Re-evaluated:Patient Re-evaluated prior to induction Oxygen Delivery Method: Circle System Utilized Preoxygenation: Pre-oxygenation with 100% oxygen Induction Type: IV induction Ventilation: Mask ventilation without difficulty Laryngoscope Size: Miller and 2 Grade View: Grade I Tube type: Oral Tube size: 7.5 mm Number of attempts: 1 Airway Equipment and Method: Stylet and Oral airway Placement Confirmation: ETT inserted through vocal cords under direct vision, positive ETCO2 and breath sounds checked- equal and bilateral Secured at: 22 cm Tube secured with: Tape Dental Injury: Teeth and Oropharynx as per pre-operative assessment

## 2022-07-22 NOTE — H&P (Signed)
Chief Complaint   Back and leg pain  History of Present Illness  Emily Nunez is a 61 y.o. female initially seen in the outpatient neurosurgery clinic.  Her symptoms started several years ago without any identifiable inciting event.  She initially had relatively manageable low back pain.  As time progressed, her symptoms have now included radiation of pain through the left posterior lateral thigh to about the knee.  Symptoms are exacerbated with being in the same position for any length of time.  She does not report any lower extremity paresthesias or changes in bladder function.  She has undergone multiple different conservative treatments including physical therapy, anti-inflammatory medication, and a series of injections without lasting improvement.  Past Medical History   Past Medical History:  Diagnosis Date   Abnormality of right breast on screening mammogram 06/05/2019   Cancer ([place])    BASAL CELL /NOSE   Cancer ([place]) 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   Dysrhythmia    a. fib   Hypothyroid    Urinary incontinence    Vitamin D deficiency 09/2010   VIT D = 22    Past Surgical History   Past Surgical History:  Procedure Laterality Date   BASAL CELL CARCINOMA EXCISION     BREAST LUMPECTOMY WITH RADIOACTIVE SEED LOCALIZATION Right 06/05/2019   Procedure: RIGHT BREAST LUMPECTOMY WITH RADIOACTIVE SEED LOCALIZATION;  Surgeon: Fanny Skates, MD;  Location: [facility];  Service: General;  Laterality: Right;   BUBBLE STUDY  06/28/2021   Procedure: BUBBLE STUDY;  Surgeon: Fay Records, MD;  Location: [place];  Service: Cardiovascular;;   CARDIOVERSION N/A 06/28/2021   Procedure: CARDIOVERSION;  Surgeon: Fay Records, MD;  Location: [facility] ENDOSCOPY;  Service: Cardiovascular;  Laterality: N/A;   COLONOSCOPY     DILATATION & CURETTAGE/HYSTEROSCOPY WITH MYOSURE N/A 06/25/2019   Procedure: [place];  Surgeon: Anastasio Auerbach, MD;   Location: [place];  Service: Gynecology;  Laterality: N/A;   ENDOMETRIAL ABLATION  2005   HYDROTHERMAL ABLATION   PELVIC LAPAROSCOPY  04/2004   ENDO/CYST   TEE WITHOUT CARDIOVERSION N/A 06/28/2021   Procedure: TRANSESOPHAGEAL ECHOCARDIOGRAM (TEE);  Surgeon: Fay Records, MD;  Location: [facility] ENDOSCOPY;  Service: Cardiovascular;  Laterality: N/A;   TONSILLECTOMY  1966   URETHRAL SLING      Social History   Social History   Tobacco Use   Smoking status: Never   Smokeless tobacco: Never  Vaping Use   Vaping Use: Never used  Substance Use Topics   Alcohol use: Not Currently    Comment: social   Drug use: No    Medications   Prior to Admission medications   Medication Sig Start Date End Date Taking? Authorizing Provider  acetaminophen (TYLENOL) 500 MG tablet Take 1,000 mg by mouth every 6 (six) hours as needed for mild pain or fever.   Yes [provider]  apixaban (ELIQUIS) 5 MG TABS tablet TAKE 1 TABLET(5 MG) BY MOUTH TWICE DAILY 02/02/22  Yes Vickie Epley, MD  Cyanocobalamin (B-12 PO) Take 1 tablet by mouth daily.   Yes [provider]  diltiazem (CARDIZEM CD) 240 MG 24 hr capsule Take 1 capsule (240 mg total) by mouth daily. 06/06/22 06/07/23 Yes Isaiah Serge, NP  dofetilide (TIKOSYN) 500 MCG capsule Take 1 capsule (500 mcg total) by mouth 2 (two) times daily. 06/17/22  Yes Sherran Needs, NP  levothyroxine (SYNTHROID) 150 MCG tablet Take 150-225 mcg  by mouth See admin instructions. Take 150 mcg daily except take 225 mcg on Mon and Tues   Yes [provider]  lidocaine 4 % Place 1 patch onto the skin daily as needed (pain).   Yes [provider]  Omega-3 Fatty Acids (OMEGA 3 PO) Take 1,350 mg by mouth daily.   Yes [provider]  potassium chloride SA (KLOR-CON) 20 MEQ tablet Take 1 tablet (20 mEq total) by mouth daily. 09/23/21  Yes Vickie Epley, MD  Vitamin D, Ergocalciferol, (DRISDOL) 1.25 MG (50000  UNIT) CAPS capsule Take 50,000 Units by mouth every Sunday. 01/15/21  Yes [provider]  zolpidem (AMBIEN) 10 MG tablet Take 10 mg by mouth at bedtime.   Yes [provider]    Allergies   Allergies  Allergen Reactions   Vibegron Other (See Comments) and Nausea Only   Ciprofloxacin Nausea Only   Duloxetine Other (See Comments)    Exhaustion, nausea, headache   Gabapentin     Felt weird    Macrobid [Nitrofurantoin Monohydrate Macrocrystals] Nausea Only   Phentermine     Dizziness (intolerance)   Septra [Bactrim] Nausea Only   Sertraline Other (See Comments)    Unknown    Review of Systems  ROS  Neurologic Exam  Awake, alert, oriented Memory and concentration grossly intact Speech fluent, appropriate CN grossly intact Motor exam: Upper Extremities Deltoid Bicep Tricep Grip  Right 5/5 5/5 5/5 5/5  Left 5/5 5/5 5/5 5/5   Lower Extremities IP Quad PF DF EHL  Right 5/5 5/5 5/5 5/5 5/5  Left 5/5 5/5 5/5 5/5 5/5   Sensation grossly intact to LT  Imaging  MRI of the lumbar spine dated 12/04/2021 was personally reviewed.  This demonstrates maintenance of lumbar lordosis.  There is mild disc degenerative disease with minimal loss of height at 4 5 and 5 1.  Primary finding is at L4-5 where there is significant bilateral facet arthropathy and ligamentous hypertrophy and associated grade 1 degenerative anterolisthesis.  This results in moderately severe central and bilateral lateral recess stenosis.  Impression  - 61 y.o. female with back and leg pain likely related to degenerative spondylolisthesis and associated stenosis at L4-5.  She has failed reasonable conservative treatments.  Plan  -We will plan on proceeding with decompression and fusion at L4-5  I have reviewed the details of the operation at length with the patient in the office.  We have discussed the expected postoperative course and recovery.  We have also reviewed the risks of the procedure,  benefits, and alternatives to the surgery.  All her questions today were answered and she provided informed consent to proceed.   Consuella Lose, MD [facility]

## 2022-07-23 DIAGNOSIS — M48062 Spinal stenosis, lumbar region with neurogenic claudication: Secondary | ICD-10-CM | POA: Diagnosis not present

## 2022-07-23 MED ORDER — APIXABAN 5 MG PO TABS: ORAL_TABLET | ORAL | 10 refills | Status: DC

## 2022-07-23 MED ORDER — OXYCODONE HCL 10 MG PO TABS: 10.0000 mg | ORAL_TABLET | Freq: Four times a day (QID) | ORAL | 0 refills | Status: AC | PRN

## 2022-07-23 MED ORDER — CYCLOBENZAPRINE HCL 10 MG PO TABS: 10.0000 mg | ORAL_TABLET | Freq: Three times a day (TID) | ORAL | 0 refills | Status: DC | PRN

## 2022-07-23 MED ORDER — HYDROXYZINE HCL 25 MG PO TABS
25.0000 mg | ORAL_TABLET | Freq: Four times a day (QID) | ORAL | Status: DC | PRN
  Administered 2022-07-23: 25 mg via ORAL
  Filled 2022-07-23: qty 1

## 2022-07-23 NOTE — Evaluation (Signed)
Physical Therapy Evaluation & Discharge Patient Details Name: Emily Nunez MRN: [id number] DOB: 04/11/1961 Today's Date: 07/23/2022  History of Present Illness  61 y/o female admitted 07/22/22 following PLIF L4-5. PMH: Afib  Clinical Impression  Patient admitted following above procedure. PTA, patient lives with husband and daughter. 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 Patient currently functioning at supervision level for mobility with use of RW. Able to negotiate flight of stairs to safely navigate home environment. Educated patient on brace wear, back precautions, and progressive walking program, patient verbalized understanding. Daughter is supposed to be present for short time period and they are attempting to set up care for husband in the home. No further skilled PT needs identified acutely. No PT follow up recommended at this time. Patient has OPPT set up for 1 month post op.        Recommendations for follow up therapy are one component of a multi-disciplinary discharge planning process, led by the attending physician.  Recommendations may be updated based on patient status, additional functional criteria and insurance authorization.  Follow Up Recommendations No PT follow up (has OPPT set up for 1 month post op)      Assistance Recommended at Discharge Intermittent Supervision/Assistance  Patient can return home with the following       Equipment Recommendations Rolling Meridee Branum (2 wheels)  Recommendations for Other Services       Functional Status Assessment Patient has had a recent decline in their functional status and demonstrates the ability to make significant improvements in function in a reasonable and predictable amount of time.     Precautions / Restrictions Precautions Precautions: Fall;Back Precaution Booklet Issued: Yes (comment) Precaution Comments: reviewed 3/3 precautions Required Braces or Orthoses:  Spinal Brace Spinal Brace: Lumbar corset;Applied in sitting position Restrictions Weight Bearing Restrictions: No      Mobility  Bed Mobility Overal bed mobility: Modified Independent             General bed mobility comments: instructed on log roll technique. No assistance required. Increased time to complete    Transfers Overall transfer level: Needs assistance Equipment used: Rolling Sheyli Horwitz (2 wheels) Transfers: Sit to/from Stand Sit to Stand: Supervision           General transfer comment: supervision for safety    Ambulation/Gait Ambulation/Gait assistance: Supervision Gait Distance (Feet): 200 Feet Assistive device: Rolling Kaion Tisdale (2 wheels) Gait Pattern/deviations: Step-through pattern, Decreased stride length Gait velocity: decreased     General Gait Details: supervision for safety. Slight impulsivity with turns and at times increasing gait speed  Stairs Stairs: Yes Stairs assistance: Supervision Stair Management: Two rails, Step to pattern, Forwards Number of Stairs: 10 General stair comments: supervision for safety  Wheelchair Mobility    Modified Rankin (Stroke Patients Only)       Balance Overall balance assessment: Mild deficits observed, not formally tested                                           Pertinent Vitals/Pain Pain Assessment Pain Assessment: Faces Faces Pain Scale: Hurts little more Pain Location: back Pain Descriptors / Indicators: Grimacing, Operative site guarding Pain Intervention(s): Monitored during session    Home Living Family/patient expects to be discharged to:: Private residence Living Arrangements: Spouse/significant other Available Help at Discharge: Family Type of Home: [place]  Access: Stairs to enter Entrance Stairs-Rails: Right;Left;Can reach both Entrance Stairs-Number of Steps: 7 Alternate Level Stairs-Number of Steps: flight Home Layout: Two level Home Equipment: Tub  bench;Cane - single point;Hand held shower head      Prior Function Prior Level of Function : Independent/Modified Independent             Mobility Comments: 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 and reports 1 fall in past 6 months       Hand Dominance        Extremity/Trunk Assessment   Upper Extremity Assessment Upper Extremity Assessment: Defer to OT evaluation    Lower Extremity Assessment Lower Extremity Assessment: Generalized weakness    Cervical / Trunk Assessment Cervical / Trunk Assessment: Back Surgery  Communication   Communication: No difficulties  Cognition Arousal/Alertness: Awake/alert Behavior During Therapy: WFL for tasks assessed/performed Overall Cognitive Status: Within Functional Limits for tasks assessed                                          General Comments      Exercises     Assessment/Plan    PT Assessment Patient does not need any further PT services  PT Problem List         PT Treatment Interventions      PT Goals (Current goals can be found in the Care Plan section)  Acute Rehab PT Goals Patient Stated Goal: to reduce pain PT Goal Formulation: All assessment and education complete, DC therapy    Frequency       Co-evaluation               AM-PAC PT "6 Clicks" Mobility  Outcome Measure Help needed turning from your back to your side while in a flat bed without using bedrails?: None Help needed moving from lying on your back to sitting on the side of a flat bed without using bedrails?: None Help needed moving to and from a bed to a chair (including a wheelchair)?: A Little Help needed standing up from a chair using your arms (e.g., wheelchair or bedside chair)?: A Little Help needed to walk in hospital room?: A Little Help needed climbing 3-5 steps with a railing? : A Little 6 Click Score: 20    End of Session Equipment Utilized During Treatment: Back brace Activity  Tolerance: Patient tolerated treatment well Patient left: in bed;with call bell/phone within reach (sitting EOB) Nurse Communication: Mobility status PT Visit Diagnosis: Muscle weakness (generalized) (M62.81)    Time: 6226-3335 PT Time Calculation (min) (ACUTE ONLY): 22 min   Charges:   PT Evaluation $PT Eval Low Complexity: 1 Low          Pinchas Reither A. Gilford Rile PT, DPT Acute Rehabilitation Services Office [phone]   Linna Hoff 07/23/2022, 8:44 AM

## 2022-07-23 NOTE — Evaluation (Signed)
Occupational Therapy Evaluation Patient Details Name: Emily Nunez MRN: [id number] DOB: 29-Jan-1961 Today's Date: 07/23/2022   History of Present Illness 61 y/o female admitted 07/22/22 following PLIF L4-5. PMH: Afib   Clinical Impression   Emily Nunez was evaluated s/p the above back surgery, she is typically indep and cares for her husband. Upon evaluation pt was able to recall 3/3 back precautions, and needed min A to maintain during ADLs and mobility. Overall she required min A for LB ADLs to maintain back precautions, however she does have AE at home to increase safety and independence. She does not have acute OT needs, recommend d/c home with support of daughters for ADLs and safety initially.      Recommendations for follow up therapy are one component of a multi-disciplinary discharge planning process, led by the attending physician.  Recommendations may be updated based on patient status, additional functional criteria and insurance authorization.   Follow Up Recommendations  No OT follow up    Assistance Recommended at Discharge Intermittent Supervision/Assistance  Patient can return home with the following A little help with walking and/or transfers;A little help with bathing/dressing/bathroom;Assist for transportation;Help with stairs or ramp for entrance;Assistance with cooking/housework    Functional Status Assessment  Patient has had a recent decline in their functional status and demonstrates the ability to make significant improvements in function in a reasonable and predictable amount of time.  Equipment Recommendations  None recommended by OT    Recommendations for Other Services       Precautions / Restrictions Precautions Precautions: Fall;Back Precaution Booklet Issued: Yes (comment) Precaution Comments: reviewed 3/3 precautions Required Braces or Orthoses: Spinal Brace Spinal Brace: Lumbar corset;Applied in sitting position Restrictions Weight Bearing  Restrictions: No      Mobility Bed Mobility Overal bed mobility: Modified Independent                  Transfers Overall transfer level: Needs assistance Equipment used: Rolling walker (2 wheels) Transfers: Sit to/from Stand Sit to Stand: Supervision                  Balance Overall balance assessment: Mild deficits observed, not formally tested                                         ADL either performed or assessed with clinical judgement   ADL Overall ADL's : Needs assistance/impaired Eating/Feeding: Independent;Sitting   Grooming: Supervision/safety;Cueing for compensatory techniques;Standing   Upper Body Bathing: Supervision/ safety;Sitting;Cueing for compensatory techniques   Lower Body Bathing: Minimal assistance;Sit to/from stand   Upper Body Dressing : Supervision/safety;Sitting   Lower Body Dressing: Minimal assistance;Sit to/from stand   Toilet Transfer: Supervision/safety;Rolling walker (2 wheels)   Toileting- Clothing Manipulation and Hygiene: Supervision/safety;Sitting/lateral lean       Functional mobility during ADLs: Supervision/safety;Rolling walker (2 wheels) General ADL Comments: educated on AE and increased indep with AE while maintaining back precatuions     Vision Baseline Vision/History: 0 No visual deficits Vision Assessment?: No apparent visual deficits     Perception     Praxis      Pertinent Vitals/Pain Pain Assessment Pain Assessment: Faces Faces Pain Scale: Hurts little more Pain Location: back Pain Descriptors / Indicators: Grimacing, Operative site guarding Pain Intervention(s): Limited activity within patient's tolerance, Monitored during session     Hand Dominance Right   Extremity/Trunk Assessment Upper Extremity Assessment Upper  Extremity Assessment: Overall WFL for tasks assessed   Lower Extremity Assessment Lower Extremity Assessment: Defer to PT evaluation   Cervical / Trunk  Assessment Cervical / Trunk Assessment: Back Surgery   Communication Communication Communication: No difficulties   Cognition Arousal/Alertness: Awake/alert Behavior During Therapy: WFL for tasks assessed/performed Overall Cognitive Status: Within Functional Limits for tasks assessed                                       General Comments  VSS on RA    Exercises     Shoulder Instructions      Home Living Family/patient expects to be discharged to:: Private residence Living Arrangements: Spouse/significant other Available Help at Discharge: Family Type of Home: House Home Access: Stairs to enter Technical brewer of Steps: 7 Entrance Stairs-Rails: Right;Left;Can reach both Home Layout: Two level Alternate Level Stairs-Number of Steps: flight Alternate Level Stairs-Rails: Right;Left Bathroom Shower/Tub: Tub/shower unit;Walk-in shower   Bathroom Toilet: Standard     Home Equipment: Tub bench;Cane - single point;Hand held shower head          Prior Functioning/Environment Prior Level of Function : Independent/Modified Independent             Mobility Comments: 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 and reports 1 fall in past 6 months          OT Problem List: Decreased strength;Impaired balance (sitting and/or standing);Decreased range of motion;Decreased activity tolerance;Decreased knowledge of use of DME or AE;Decreased knowledge of precautions;Decreased safety awareness      OT Treatment/Interventions:      OT Goals(Current goals can be found in the care plan section) Acute Rehab OT Goals Patient Stated Goal: home OT Goal Formulation: All assessment and education complete, DC therapy Time For Goal Achievement: 08/06/22  OT Frequency:      Co-evaluation              AM-PAC OT "6 Clicks" Daily Activity     Outcome Measure Help from another person eating meals?: None Help from another person taking care of  personal grooming?: A Little Help from another person toileting, which includes using toliet, bedpan, or urinal?: A Little Help from another person bathing (including washing, rinsing, drying)?: A Little Help from another person to put on and taking off regular upper body clothing?: None Help from another person to put on and taking off regular lower body clothing?: A Little 6 Click Score: 20   End of Session Equipment Utilized During Treatment: Rolling walker (2 wheels);Back brace Nurse Communication: Mobility status  Activity Tolerance: Patient tolerated treatment well Patient left: in bed;with call bell/phone within reach  OT Visit Diagnosis: Unsteadiness on feet (R26.81);Muscle weakness (generalized) (M62.81);Pain                Time: 7616-0737 OT Time Calculation (min): 25 min Charges:  OT General Charges $OT Visit: 1 Visit OT Evaluation $OT Eval Moderate Complexity: 1 Mod    Winnona Wargo A Maxten Shuler 07/23/2022, 11:29 AM

## 2022-07-23 NOTE — Discharge Instructions (Signed)
Wound Care Keep incision covered and dry for two days.   Do not put any creams, lotions, or ointments on incision. Leave steri-strips on back.  They will fall off by themselves. Activity Walk each and every day, increasing distance each day. No lifting greater than 5 lbs.  Avoid bending, lifting and twisting. No driving for 2 weeks; may ride as a passenger locally. If provided with back brace, wear when out of bed.  It is not necessary to wear brace in bed. Diet Resume your normal diet.  Return to Work Will be discussed at you follow up appointment. Call Your Doctor If Any of These Occur Redness, drainage, or swelling at the wound.  Temperature greater than 101 degrees. Severe pain not relieved by pain medication. Incision starts to come apart. Follow Up Appt Call today for appointment in 1-2 weeks ([phone]) or for problems.  If you have any hardware placed in your spine, you will need an x-ray before your appointment.

## 2022-07-23 NOTE — Progress Notes (Signed)
Patient is discharged from room 3C10 at this time. Alert and in stable condition. IV site d/c'd and instructions read to patient and two daughters with understanding verbalized and all questions answered. Left unit via wheelchair with all belongings at side.

## 2022-07-23 NOTE — Discharge Summary (Signed)
Physician Discharge Summary  Patient ID: Emily Nunez MRN: [id number] DOB/AGE: February 28, 1961 61 y.o.  Admit date: 07/22/2022 Discharge date: 07/23/2022  Admission Diagnoses:  Spondylolisthesis L4-5  Discharge Diagnoses:  Same Principal Problem:   Spondylolisthesis at L4-L5 level   Discharged Condition: Stable  Hospital Course:  Emily Nunez is a 61 y.o. female admitted after elective L4-5 decompression/fusion. She reports improvement in preop leg pain and back pain. She is ambulating well, tolerating diet, voiding normally, pain controlled with oral medication.  Treatments: Surgery - L4-5 decompression fusion  Discharge Exam: Blood pressure 136/77, pulse 98, temperature 97.8 F (36.6 C), resp. rate 18, height '5\' 6"'$  (1.676 m), weight 103 kg, SpO2 95 %. Awake, alert, oriented Speech fluent, appropriate CN grossly intact 5/5 BUE/BLE Wound c/d/i  Disposition: Discharge disposition: 01-Home or Self Care       Discharge Instructions     Call MD for:  redness, tenderness, or signs of infection (pain, swelling, redness, odor or green/yellow discharge around incision site)   Complete by: As directed    Call MD for:  temperature >100.4   Complete by: As directed    Diet - low sodium heart healthy   Complete by: As directed    Discharge instructions   Complete by: As directed    Walk at home as much as possible, at least 4 times / day   Incentive spirometry RT   Complete by: As directed    Increase activity slowly   Complete by: As directed    Lifting restrictions   Complete by: As directed    No lifting > 10 lbs   May shower / Bathe   Complete by: As directed    48 hours after surgery   May walk up steps   Complete by: As directed    Other Restrictions   Complete by: As directed    No bending/twisting at waist   Remove dressing in 24 hours   Complete by: As directed       Allergies as of 07/23/2022       Reactions   Vibegron Other (See Comments),  Nausea Only   Ciprofloxacin Nausea Only   Duloxetine Other (See Comments)   Exhaustion, nausea, headache   Gabapentin    Felt weird    Macrobid [nitrofurantoin Monohydrate Macrocrystals] Nausea Only   Phentermine    Dizziness (intolerance)   Septra [bactrim] Nausea Only   Sertraline Other (See Comments)   Unknown        Medication List     TAKE these medications    acetaminophen 500 MG tablet Commonly known as: TYLENOL Take 1,000 mg by mouth every 6 (six) hours as needed for mild pain or fever.   apixaban 5 MG Tabs tablet Commonly known as: Eliquis TAKE 1 TABLET(5 MG) BY MOUTH TWICE DAILY Start taking on: July 27, 2022 What changed: These instructions start on July 27, 2022. If you are unsure what to do until then, ask your doctor or other care provider.   B-12 PO Take 1 tablet by mouth daily.   cyclobenzaprine 10 MG tablet Commonly known as: FLEXERIL Take 1 tablet (10 mg total) by mouth 3 (three) times daily as needed for muscle spasms.   diltiazem 240 MG 24 hr capsule Commonly known as: CARDIZEM CD Take 1 capsule (240 mg total) by mouth daily.   dofetilide 500 MCG capsule Commonly known as: TIKOSYN Take 1 capsule (500 mcg total) by mouth 2 (two) times daily.   levothyroxine 150  MCG tablet Commonly known as: SYNTHROID Take 150-225 mcg by mouth See admin instructions. Take 150 mcg daily except take 225 mcg on Mon and Tues   lidocaine 4 % Place 1 patch onto the skin daily as needed (pain).   OMEGA 3 PO Take 1,350 mg by mouth daily.   Oxycodone HCl 10 MG Tabs Take 1 tablet (10 mg total) by mouth every 6 (six) hours as needed for up to 7 days for severe pain ((score 7 to 10)).   potassium chloride SA 20 MEQ tablet Commonly known as: KLOR-CON M Take 1 tablet (20 mEq total) by mouth daily.   Vitamin D (Ergocalciferol) 1.25 MG (50000 UNIT) Caps capsule Commonly known as: DRISDOL Take 50,000 Units by mouth every Sunday.   zolpidem 10 MG tablet Commonly  known as: AMBIEN Take 10 mg by mouth at bedtime.        Follow-up Information     Consuella Lose, MD Follow up in 3 week(s).   Specialty: Neurosurgery Contact information: [address] [place] [address] [phone]                 Signed: Jairo Ben 07/23/2022, 9:09 AM

## 2022-07-25 ENCOUNTER — Inpatient Hospital Stay (HOSPITAL_BASED_OUTPATIENT_CLINIC_OR_DEPARTMENT_OTHER)
Admission: EM | Admit: 2022-07-25 | Discharge: 2022-07-29 | DRG: 552 | Disposition: A | Discharge status: Home Health | Payer: BC Managed Care – PPO | Attending: Family Medicine | Admitting: Family Medicine

## 2022-07-25 ENCOUNTER — Other Ambulatory Visit: Payer: Self-pay

## 2022-07-25 ENCOUNTER — Emergency Department (HOSPITAL_COMMUNITY)
Admission: EM | Admit: 2022-07-25 | Discharge: 2022-07-25 | Discharge status: Against Medical Advice | Payer: BC Managed Care – PPO | Source: Home / Self Care

## 2022-07-25 ENCOUNTER — Encounter (HOSPITAL_COMMUNITY): Payer: Self-pay

## 2022-07-25 ENCOUNTER — Emergency Department (HOSPITAL_BASED_OUTPATIENT_CLINIC_OR_DEPARTMENT_OTHER): Payer: BC Managed Care – PPO

## 2022-07-25 ENCOUNTER — Encounter (HOSPITAL_BASED_OUTPATIENT_CLINIC_OR_DEPARTMENT_OTHER): Payer: Self-pay

## 2022-07-25 DIAGNOSIS — G8918 Other acute postprocedural pain: Secondary | ICD-10-CM

## 2022-07-25 DIAGNOSIS — I420 Dilated cardiomyopathy: Secondary | ICD-10-CM | POA: Diagnosis present

## 2022-07-25 DIAGNOSIS — M545 Low back pain, unspecified: Secondary | ICD-10-CM | POA: Insufficient documentation

## 2022-07-25 DIAGNOSIS — N390 Urinary tract infection, site not specified: Secondary | ICD-10-CM

## 2022-07-25 DIAGNOSIS — I4892 Unspecified atrial flutter: Secondary | ICD-10-CM | POA: Diagnosis present

## 2022-07-25 DIAGNOSIS — M4316 Spondylolisthesis, lumbar region: Secondary | ICD-10-CM

## 2022-07-25 DIAGNOSIS — I4891 Unspecified atrial fibrillation: Secondary | ICD-10-CM | POA: Diagnosis not present

## 2022-07-25 DIAGNOSIS — Z881 Allergy status to other antibiotic agents status: Secondary | ICD-10-CM

## 2022-07-25 DIAGNOSIS — I480 Paroxysmal atrial fibrillation: Secondary | ICD-10-CM | POA: Diagnosis present

## 2022-07-25 DIAGNOSIS — Z7901 Long term (current) use of anticoagulants: Secondary | ICD-10-CM

## 2022-07-25 DIAGNOSIS — M5416 Radiculopathy, lumbar region: Principal | ICD-10-CM | POA: Diagnosis present

## 2022-07-25 DIAGNOSIS — Z6836 Body mass index (BMI) 36.0-36.9, adult: Secondary | ICD-10-CM

## 2022-07-25 DIAGNOSIS — Z7984 Long term (current) use of oral hypoglycemic drugs: Secondary | ICD-10-CM

## 2022-07-25 DIAGNOSIS — Z981 Arthrodesis status: Secondary | ICD-10-CM

## 2022-07-25 DIAGNOSIS — E669 Obesity, unspecified: Secondary | ICD-10-CM | POA: Diagnosis present

## 2022-07-25 DIAGNOSIS — Z8249 Family history of ischemic heart disease and other diseases of the circulatory system: Secondary | ICD-10-CM

## 2022-07-25 DIAGNOSIS — Z888 Allergy status to other drugs, medicaments and biological substances status: Secondary | ICD-10-CM

## 2022-07-25 DIAGNOSIS — Y92009 Unspecified place in unspecified non-institutional (private) residence as the place of occurrence of the external cause: Secondary | ICD-10-CM

## 2022-07-25 DIAGNOSIS — Z79899 Other long term (current) drug therapy: Secondary | ICD-10-CM

## 2022-07-25 DIAGNOSIS — Z5321 Procedure and treatment not carried out due to patient leaving prior to being seen by health care provider: Secondary | ICD-10-CM | POA: Insufficient documentation

## 2022-07-25 DIAGNOSIS — W19XXXA Unspecified fall, initial encounter: Secondary | ICD-10-CM | POA: Diagnosis present

## 2022-07-25 DIAGNOSIS — M549 Dorsalgia, unspecified: Secondary | ICD-10-CM

## 2022-07-25 DIAGNOSIS — G8929 Other chronic pain: Secondary | ICD-10-CM | POA: Diagnosis present

## 2022-07-25 DIAGNOSIS — E039 Hypothyroidism, unspecified: Secondary | ICD-10-CM | POA: Diagnosis present

## 2022-07-25 DIAGNOSIS — Z85828 Personal history of other malignant neoplasm of skin: Secondary | ICD-10-CM

## 2022-07-25 DIAGNOSIS — Z800 Family history of malignant neoplasm of digestive organs: Secondary | ICD-10-CM

## 2022-07-25 HISTORY — DX: Unspecified atrial fibrillation: I48.91

## 2022-07-25 LAB — CBC WITH DIFFERENTIAL/PLATELET
Abs Immature Granulocytes: 0.02 10*3/uL (ref 0.00–0.07)
Abs Immature Granulocytes: 0.02 10*3/uL (ref 0.00–0.07)
Basophils Absolute: 0 10*3/uL (ref 0.0–0.1)
Basophils Absolute: 0 10*3/uL (ref 0.0–0.1)
Basophils Relative: 0 %
Basophils Relative: 0 %
Eosinophils Absolute: 0.2 10*3/uL (ref 0.0–0.5)
Eosinophils Absolute: 0.1 10*3/uL (ref 0.0–0.5)
Eosinophils Relative: 2 %
Eosinophils Relative: 2 %
HCT: 33.2 % — ABNORMAL LOW (ref 36.0–46.0)
HCT: 32 % — ABNORMAL LOW (ref 36.0–46.0)
Hemoglobin: 10.7 g/dL — ABNORMAL LOW (ref 12.0–15.0)
Hemoglobin: 10.5 g/dL — ABNORMAL LOW (ref 12.0–15.0)
Immature Granulocytes: 0 %
Immature Granulocytes: 0 %
Lymphocytes Relative: 29 %
Lymphocytes Relative: 22 %
Lymphs Abs: 2.5 10*3/uL (ref 0.7–4.0)
Lymphs Abs: 1.8 10*3/uL (ref 0.7–4.0)
MCH: 29.2 pg (ref 26.0–34.0)
MCH: 29.2 pg (ref 26.0–34.0)
MCHC: 32.2 g/dL (ref 30.0–36.0)
MCHC: 32.8 g/dL (ref 30.0–36.0)
MCV: 90.5 fL (ref 80.0–100.0)
MCV: 89.1 fL (ref 80.0–100.0)
Monocytes Absolute: 0.7 10*3/uL (ref 0.1–1.0)
Monocytes Absolute: 0.6 10*3/uL (ref 0.1–1.0)
Monocytes Relative: 7 %
Monocytes Relative: 7 %
Neutro Abs: 5.5 10*3/uL (ref 1.7–7.7)
Neutro Abs: 5.5 10*3/uL (ref 1.7–7.7)
Neutrophils Relative %: 62 %
Neutrophils Relative %: 69 %
Platelets: 264 10*3/uL (ref 150–400)
Platelets: 275 10*3/uL (ref 150–400)
RBC: 3.67 MIL/uL — ABNORMAL LOW (ref 3.87–5.11)
RBC: 3.59 MIL/uL — ABNORMAL LOW (ref 3.87–5.11)
RDW: 13.7 % (ref 11.5–15.5)
RDW: 13.7 % (ref 11.5–15.5)
WBC: 8.9 10*3/uL (ref 4.0–10.5)
WBC: 8 10*3/uL (ref 4.0–10.5)
nRBC: 0 % (ref 0.0–0.2)
nRBC: 0 % (ref 0.0–0.2)

## 2022-07-25 LAB — BASIC METABOLIC PANEL
Anion gap: 10 (ref 5–15)
BUN: 22 mg/dL (ref 8–23)
CO2: 26 mmol/L (ref 22–32)
Calcium: 9.2 mg/dL (ref 8.9–10.3)
Chloride: 104 mmol/L (ref 98–111)
Creatinine, Ser: 0.86 mg/dL (ref 0.44–1.00)
GFR, Estimated: >60 mL/min (ref >60)
Glucose, Bld: 107 mg/dL — ABNORMAL HIGH (ref 70–99)
Potassium: 3.5 mmol/L (ref 3.5–5.1)
Sodium: 140 mmol/L (ref 135–145)

## 2022-07-25 LAB — COMPREHENSIVE METABOLIC PANEL
ALT: 24 U/L (ref 0–44)
AST: 43 U/L — ABNORMAL HIGH (ref 15–41)
Albumin: 3.5 g/dL (ref 3.5–5.0)
Alkaline Phosphatase: 64 U/L (ref 38–126)
Anion gap: 9 (ref 5–15)
BUN: 20 mg/dL (ref 8–23)
CO2: 28 mmol/L (ref 22–32)
Calcium: 9.2 mg/dL (ref 8.9–10.3)
Chloride: 102 mmol/L (ref 98–111)
Creatinine, Ser: 0.81 mg/dL (ref 0.44–1.00)
GFR, Estimated: >60 mL/min (ref >60)
Glucose, Bld: 112 mg/dL — ABNORMAL HIGH (ref 70–99)
Potassium: 3.6 mmol/L (ref 3.5–5.1)
Sodium: 139 mmol/L (ref 135–145)
Total Bilirubin: 0.6 mg/dL (ref 0.3–1.2)
Total Protein: 6.9 g/dL (ref 6.5–8.1)

## 2022-07-25 LAB — PROTIME-INR
INR: 1.1 (ref 0.8–1.2)
Prothrombin Time: 13.7 seconds (ref 11.4–15.2)

## 2022-07-25 MED ORDER — DILTIAZEM HCL-DEXTROSE 125-5 MG/125ML-% IV SOLN (PREMIX)
5.0000 mg/h | INTRAVENOUS | Status: DC
  Administered 2022-07-25: 5 mg/h via INTRAVENOUS
  Administered 2022-07-26 – 2022-07-27 (×3): 10 mg/h via INTRAVENOUS
  Filled 2022-07-25 (×5): qty 125

## 2022-07-25 MED ORDER — HYDROMORPHONE HCL 1 MG/ML IJ SOLN
1.0000 mg | Freq: Once | INTRAMUSCULAR | Status: AC
  Administered 2022-07-25: 1 mg via INTRAVENOUS
  Filled 2022-07-25: qty 1

## 2022-07-25 MED ORDER — DILTIAZEM LOAD VIA INFUSION
20.0000 mg | Freq: Once | INTRAVENOUS | Status: AC
  Administered 2022-07-25: 20 mg via INTRAVENOUS
  Filled 2022-07-25: qty 20

## 2022-07-25 MED ORDER — ONDANSETRON HCL 4 MG/2ML IJ SOLN
4.0000 mg | Freq: Once | INTRAMUSCULAR | Status: AC
  Administered 2022-07-25: 4 mg via INTRAVENOUS
  Filled 2022-07-25: qty 2

## 2022-07-25 MED ORDER — SODIUM CHLORIDE 0.9 % IV BOLUS
1000.0000 mL | Freq: Once | INTRAVENOUS | Status: AC
  Administered 2022-07-25: 1000 mL via INTRAVENOUS

## 2022-07-25 MED FILL — Thrombin For Soln 5000 Unit: CUTANEOUS | Qty: 5000 | Status: AC

## 2022-07-25 NOTE — ED Provider Triage Note (Signed)
Emergency Medicine Provider Triage Evaluation Note  Emily Nunez , a 61 y.o. female  was evaluated in triage.  Pt complains of severe low back pain.  Had a lumbar fusion on Friday and has been given Tylenol and p.o. Dilaudid at home which is helping her but not "as much as it should be."  No fevers or chills.  No saddle anesthesia, bowel/bladder dysfunction, leg weakness or falls  Review of Systems  Positive: Pain Negative: Saddle anesthesia, bowel/bladder dysfunction, fevers  Physical Exam  SpO2 96%  Gen:   Awake, no distress   Resp:  Normal effort  MSK:   Moves extremities without difficulty  Other:  Midline lumbar spine tenderness, as to be expected after surgery.  5 out of 5 strength in bilateral lower extremities.  Sensation intact.  Medical Decision Making  Medically screening exam initiated at 12:22 PM.  Appropriate orders placed.  Emily Nunez was informed that the remainder of the evaluation will be completed by another provider, this initial triage assessment does not replace that evaluation, and the importance of remaining in the ED until their evaluation is complete.  I did not take down patient's entire surgical dressing however no surrounding cellulitis or abnormalities noted through the adhesive.  We will do basic labs however believe this is more pain control   Kazandra Forstrom A, PA-C 07/25/22 1224

## 2022-07-25 NOTE — ED Triage Notes (Addendum)
Pt brought in by son in law after waiting 7 hours at [place] had fusion surgery Friday morning PLIF. Pt reports cant take steps today and is falling falling x 2 today  Per patient surgeon aware . Denies numbness and tingling down legs. Honeycomb dressing CDI to lower back

## 2022-07-25 NOTE — ED Notes (Signed)
Pt stated to CT tech that she would not be able to tolerate CT today; will alert provider. Pt noted to be in distress and in pain. Pt given pain meds. Requested more from provider.

## 2022-07-25 NOTE — ED Provider Notes (Addendum)
[facility] DEPT [facility] Provider Note: Georgena Spurling, MD, FACEP  CSN: [id number] MRN: [id number] ARRIVAL: 07/25/22 at [place]: [place]  Back Pain   HISTORY OF PRESENT ILLNESS  07/25/22 11:17 PM Emily Nunez is a 61 y.o. female who underwent lumbar surgery on 07/22/2022.  She returns with increased lumbar pain (10 out of 10) and inability to walk due to pain.  She fell twice today.  She is still able to move her lower extremities and has sensation intact in her lower extremities.  She has not had bowel or bladder changes.  She denies any numbness or tingling in her legs.  She was taken by ambulance to [place] earlier today and waited 7 hours without being seen.  They then came over here and has been here about 5 hours.  She has a history of atrial fibrillation (for which she is on Tikosyn and Cardizem) and is been noted to be in and out of atrial fibrillation with rates as high as the 150s.  She is not currently taking her Eliquis.    Past Medical History:  Diagnosis Date   Abnormality of right breast on screening mammogram 06/05/2019   Atrial fibrillation ([place])    Cancer ([place])    BASAL CELL /NOSE   Cancer ([place]) 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   Dysrhythmia    a. fib   Hypothyroid    Urinary incontinence    Vitamin D deficiency 09/2010   VIT D = 22    Past Surgical History:  Procedure Laterality Date   BASAL CELL CARCINOMA EXCISION     BREAST LUMPECTOMY WITH RADIOACTIVE SEED LOCALIZATION Right 06/05/2019   Procedure: RIGHT BREAST LUMPECTOMY WITH RADIOACTIVE SEED LOCALIZATION;  Surgeon: Fanny Skates, MD;  Location: [place];  Service: General;  Laterality: Right;   BUBBLE STUDY  06/28/2021   Procedure: BUBBLE STUDY;  Surgeon: Fay Records, MD;  Location: [place];  Service: Cardiovascular;;   CARDIOVERSION N/A 06/28/2021   Procedure: CARDIOVERSION;  Surgeon: Fay Records, MD;  Location: [place];  Service:  Cardiovascular;  Laterality: N/A;   COLONOSCOPY     DILATATION & CURETTAGE/HYSTEROSCOPY WITH MYOSURE N/A 06/25/2019   Procedure: [place];  Surgeon: Anastasio Auerbach, MD;  Location: [place];  Service: Gynecology;  Laterality: N/A;   ENDOMETRIAL ABLATION  2005   HYDROTHERMAL ABLATION   MAXIMUM ACCESS (MAS)POSTERIOR LUMBAR INTERBODY FUSION (PLIF) 3 LEVEL     PELVIC LAPAROSCOPY  04/2004   ENDO/CYST   TEE WITHOUT CARDIOVERSION N/A 06/28/2021   Procedure: TRANSESOPHAGEAL ECHOCARDIOGRAM (TEE);  Surgeon: Fay Records, MD;  Location: [facility] ENDOSCOPY;  Service: Cardiovascular;  Laterality: N/A;   TONSILLECTOMY  1966   URETHRAL SLING      Family History  Problem Relation Age of Onset   Cancer Mother        COLON   Heart disease Father     Social History   Tobacco Use   Smoking status: Never   Smokeless tobacco: Never  Vaping Use   Vaping Use: Never used  Substance Use Topics   Alcohol use: Not Currently    Comment: social   Drug use: No    Prior to Admission medications   Medication Sig Start Date End Date Taking? Authorizing Provider  diltiazem (CARDIZEM CD) 240 MG 24 hr capsule Take 1 capsule (240 mg total) by mouth daily. 06/06/22 06/07/23 Yes Isaiah Serge, NP  acetaminophen (  TYLENOL) 500 MG tablet Take 1,000 mg by mouth every 6 (six) hours as needed for mild pain or fever.    [provider]  apixaban (ELIQUIS) 5 MG TABS tablet TAKE 1 TABLET(5 MG) BY MOUTH TWICE DAILY 07/27/22   Consuella Lose, MD  Cyanocobalamin (B-12 PO) Take 1 tablet by mouth daily.    [provider]  cyclobenzaprine (FLEXERIL) 10 MG tablet Take 1 tablet (10 mg total) by mouth 3 (three) times daily as needed for muscle spasms. 07/23/22   Consuella Lose, MD  dofetilide (TIKOSYN) 500 MCG capsule Take 1 capsule (500 mcg total) by mouth 2 (two) times daily. 06/17/22   Sherran Needs, NP  levothyroxine (SYNTHROID) 150 MCG tablet  Take 150-225 mcg by mouth See admin instructions. Take 150 mcg daily except take 225 mcg on Mon and Tues    [provider]  lidocaine 4 % Place 1 patch onto the skin daily as needed (pain).    [provider]  Omega-3 Fatty Acids (OMEGA 3 PO) Take 1,350 mg by mouth daily.    [provider]  oxyCODONE 10 MG TABS Take 1 tablet (10 mg total) by mouth every 6 (six) hours as needed for up to 7 days for severe pain ((score 7 to 10)). 07/23/22 07/30/22  Consuella Lose, MD  potassium chloride SA (KLOR-CON) 20 MEQ tablet Take 1 tablet (20 mEq total) by mouth daily. 09/23/21   Vickie Epley, MD  Vitamin D, Ergocalciferol, (DRISDOL) 1.25 MG (50000 UNIT) CAPS capsule Take 50,000 Units by mouth every Sunday. 01/15/21   [provider]  zolpidem (AMBIEN) 10 MG tablet Take 10 mg by mouth at bedtime.    [provider]    Allergies Vibegron, Ciprofloxacin, Duloxetine, Gabapentin, Macrobid [nitrofurantoin monohydrate macrocrystals], Phentermine, Septra [bactrim], and Sertraline   REVIEW OF SYSTEMS  Negative except as noted here or in the History of Present Illness.   PHYSICAL EXAMINATION  Initial Vital Signs Blood pressure (!) 148/93, pulse (!) 159, temperature 98 F (36.7 C), resp. rate 20, SpO2 96 %.  Examination General: Well-developed, well-nourished female in no acute distress; appearance consistent with age of record HENT: normocephalic; atraumatic Eyes: pupils equal, round and reactive to light; extraocular muscles intact Neck: supple Heart: Regular rate and rhythm with intermittent episodes of irregular rhythm; tachycardia Lungs: clear to auscultation bilaterally Abdomen: soft; nondistended; nontender; no masses or hepatosplenomegaly; bowel sounds present Back: Bandaged vertical lumbar incision Extremities: No deformity; full range of motion; pulses normal Neurologic: Awake, alert and oriented; motor function intact in all extremities and  symmetric but exam somewhat limited due to patient only willing to sit in an upright position at edge of bed; sensation intact in lower extremities and symmetric; no facial droop Skin: Warm and dry Psychiatric: Normal mood and affect   RESULTS  Summary of this visit's results, reviewed and interpreted by myself:   EKG Interpretation  Date/Time:    Ventricular Rate:    PR Interval:    QRS Duration:   QT Interval:    QTC Calculation:   R Axis:     Text Interpretation:         Laboratory Studies: Results for orders placed or performed during the hospital encounter of 07/25/22 (from the past 24 hour(s))  Comprehensive metabolic panel     Status: Abnormal   Collection Time: 07/25/22 10:34 PM  Result Value Ref Range   Sodium 139 135 - 145 mmol/L   Potassium 3.6 3.5 - 5.1 mmol/L  Chloride 102 98 - 111 mmol/L   CO2 28 22 - 32 mmol/L   Glucose, Bld 112 (H) 70 - 99 mg/dL   BUN 20 8 - 23 mg/dL   Creatinine, Ser 0.81 0.44 - 1.00 mg/dL   Calcium 9.2 8.9 - 10.3 mg/dL   Total Protein 6.9 6.5 - 8.1 g/dL   Albumin 3.5 3.5 - 5.0 g/dL   AST 43 (H) 15 - 41 U/L   ALT 24 0 - 44 U/L   Alkaline Phosphatase 64 38 - 126 U/L   Total Bilirubin 0.6 0.3 - 1.2 mg/dL   GFR, Estimated >60 >60 mL/min   Anion gap 9 5 - 15  CBC with Differential     Status: Abnormal   Collection Time: 07/25/22 10:34 PM  Result Value Ref Range   WBC 8.0 4.0 - 10.5 K/uL   RBC 3.59 (L) 3.87 - 5.11 MIL/uL   Hemoglobin 10.5 (L) 12.0 - 15.0 g/dL   HCT 32.0 (L) 36.0 - 46.0 %   MCV 89.1 80.0 - 100.0 fL   MCH 29.2 26.0 - 34.0 pg   MCHC 32.8 30.0 - 36.0 g/dL   RDW 13.7 11.5 - 15.5 %   Platelets 275 150 - 400 K/uL   nRBC 0.0 0.0 - 0.2 %   Neutrophils Relative % 69 %   Neutro Abs 5.5 1.7 - 7.7 K/uL   Lymphocytes Relative 22 %   Lymphs Abs 1.8 0.7 - 4.0 K/uL   Monocytes Relative 7 %   Monocytes Absolute 0.6 0.1 - 1.0 K/uL   Eosinophils Relative 2 %   Eosinophils Absolute 0.1 0.0 - 0.5 K/uL   Basophils Relative 0 %    Basophils Absolute 0.0 0.0 - 0.1 K/uL   Immature Granulocytes 0 %   Abs Immature Granulocytes 0.02 0.00 - 0.07 K/uL  Protime-INR     Status: None   Collection Time: 07/25/22 10:34 PM  Result Value Ref Range   Prothrombin Time 13.7 11.4 - 15.2 seconds   INR 1.1 0.8 - 1.2  Magnesium     Status: None   Collection Time: 07/26/22  1:26 AM  Result Value Ref Range   Magnesium 1.9 1.7 - 2.4 mg/dL   Imaging Studies: DG Lumbar Spine 1 View  Result Date: 07/26/2022 CLINICAL DATA:  Status post recent fusion surgery with multiple subsequent falls. EXAM: LUMBAR SPINE - 1 VIEW COMPARISON:  July 22, 2022 FINDINGS: There is no evidence of lumbar spine fracture. Bilateral radiopaque pedicle screws are seen at the levels of L4 and L5, with a radiopaque intervertebral disc spacer also noted at this level. Surgical hardware appears to be intact but is limited in evaluation on a single frontal view. Alignment is normal. Intervertebral disc spaces are maintained. IMPRESSION: Postoperative changes at the level of L4-L5, as described above. Electronically Signed   By: Virgina Norfolk M.D.   On: 07/26/2022 00:44    ED COURSE and MDM  Nursing notes, initial and subsequent vitals signs, including pulse oximetry, reviewed and interpreted by myself.  Vitals:   07/26/22 0045 07/26/22 0048 07/26/22 0101 07/26/22 0130  BP: 110/89   (!) 107/57  Pulse: 89 93 89 81  Resp: '18 15 12 '$ (!) 21  Temp:      TempSrc:      SpO2:  93% 95% 96%   Medications  diltiazem (CARDIZEM) 1 mg/mL load via infusion 20 mg (20 mg Intravenous Bolus from Bag 07/25/22 2338)    And  diltiazem (CARDIZEM) 125 mg  in dextrose 5% 125 mL (1 mg/mL) infusion (10 mg/hr Intravenous Rate/Dose Change 07/26/22 0023)  HYDROmorphone (DILAUDID) injection 1 mg (1 mg Intravenous Given 07/26/22 0149)  HYDROmorphone (DILAUDID) injection 1 mg (1 mg Intravenous Given 07/25/22 2235)  ondansetron (ZOFRAN) injection 4 mg (4 mg Intravenous Given 07/25/22 2235)  sodium  chloride 0.9 % bolus 1,000 mL (1,000 mLs Intravenous New Bag/Given 07/25/22 2342)  HYDROmorphone (DILAUDID) injection 1 mg (1 mg Intravenous Given 07/25/22 2335)   11:39 PM IV fluid bolus and Cardizem ordered for tachycardia/A-fib with RVR.  Patient admits she may not have consumed adequate fluids today.  Patient unable to lie on CT table for CT scan.  We will obtain plain films of the lumbar region.  1:24 AM Dr. Cyd Silence excepts patient for admission to the hospitalist service.  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  Patient's rate is controlled on Cardizem drip but she keeps switching between atrial fibrillation, atrial flutter and sinus rhythm.   CHA2DS2-VASc Score = 1   PROCEDURES  Procedures   ED DIAGNOSES     ICD-10-CM   1. Atrial fibrillation with rapid ventricular response (HCC)  I48.91     2. Postoperative pain after spinal surgery  G89.18          Talon Witting, Jenny Reichmann, MD 07/26/22 0236    Shanon Rosser, MD 07/26/22 [phone]

## 2022-07-25 NOTE — ED Triage Notes (Signed)
Per EMS- Patient is from home. Patient reports that she had back surgery / fusion to the L4-L5 4 days ago. Patient reports that the pain meds are not working and she has decreased mobility at home. Patient states she fell forward onto her knees at home.

## 2022-07-26 ENCOUNTER — Encounter (HOSPITAL_COMMUNITY): Payer: Self-pay

## 2022-07-26 DIAGNOSIS — Z7984 Long term (current) use of oral hypoglycemic drugs: Secondary | ICD-10-CM | POA: Diagnosis not present

## 2022-07-26 DIAGNOSIS — Y92009 Unspecified place in unspecified non-institutional (private) residence as the place of occurrence of the external cause: Secondary | ICD-10-CM | POA: Diagnosis not present

## 2022-07-26 DIAGNOSIS — Z800 Family history of malignant neoplasm of digestive organs: Secondary | ICD-10-CM | POA: Diagnosis not present

## 2022-07-26 DIAGNOSIS — M549 Dorsalgia, unspecified: Secondary | ICD-10-CM | POA: Diagnosis not present

## 2022-07-26 DIAGNOSIS — M4316 Spondylolisthesis, lumbar region: Secondary | ICD-10-CM | POA: Diagnosis not present

## 2022-07-26 DIAGNOSIS — W19XXXA Unspecified fall, initial encounter: Secondary | ICD-10-CM | POA: Diagnosis not present

## 2022-07-26 DIAGNOSIS — Z8249 Family history of ischemic heart disease and other diseases of the circulatory system: Secondary | ICD-10-CM | POA: Diagnosis not present

## 2022-07-26 DIAGNOSIS — E669 Obesity, unspecified: Secondary | ICD-10-CM | POA: Diagnosis not present

## 2022-07-26 DIAGNOSIS — Z888 Allergy status to other drugs, medicaments and biological substances status: Secondary | ICD-10-CM | POA: Diagnosis not present

## 2022-07-26 DIAGNOSIS — I480 Paroxysmal atrial fibrillation: Secondary | ICD-10-CM | POA: Diagnosis not present

## 2022-07-26 DIAGNOSIS — Z85828 Personal history of other malignant neoplasm of skin: Secondary | ICD-10-CM | POA: Diagnosis not present

## 2022-07-26 DIAGNOSIS — I420 Dilated cardiomyopathy: Secondary | ICD-10-CM | POA: Diagnosis not present

## 2022-07-26 DIAGNOSIS — Z881 Allergy status to other antibiotic agents status: Secondary | ICD-10-CM | POA: Diagnosis not present

## 2022-07-26 DIAGNOSIS — I4891 Unspecified atrial fibrillation: Secondary | ICD-10-CM | POA: Diagnosis present

## 2022-07-26 DIAGNOSIS — G8929 Other chronic pain: Secondary | ICD-10-CM | POA: Diagnosis not present

## 2022-07-26 DIAGNOSIS — E039 Hypothyroidism, unspecified: Secondary | ICD-10-CM | POA: Diagnosis not present

## 2022-07-26 DIAGNOSIS — Z981 Arthrodesis status: Secondary | ICD-10-CM | POA: Diagnosis not present

## 2022-07-26 DIAGNOSIS — I4892 Unspecified atrial flutter: Secondary | ICD-10-CM | POA: Diagnosis not present

## 2022-07-26 DIAGNOSIS — Z7901 Long term (current) use of anticoagulants: Secondary | ICD-10-CM | POA: Diagnosis not present

## 2022-07-26 DIAGNOSIS — Z6836 Body mass index (BMI) 36.0-36.9, adult: Secondary | ICD-10-CM | POA: Diagnosis not present

## 2022-07-26 DIAGNOSIS — N390 Urinary tract infection, site not specified: Secondary | ICD-10-CM | POA: Diagnosis not present

## 2022-07-26 DIAGNOSIS — Z79899 Other long term (current) drug therapy: Secondary | ICD-10-CM | POA: Diagnosis not present

## 2022-07-26 DIAGNOSIS — M5416 Radiculopathy, lumbar region: Secondary | ICD-10-CM | POA: Diagnosis not present

## 2022-07-26 LAB — MAGNESIUM: Magnesium: 1.9 mg/dL (ref 1.7–2.4)

## 2022-07-26 MED ORDER — POLYETHYLENE GLYCOL 3350 17 G PO PACK
17.0000 g | PACK | Freq: Every day | ORAL | Status: DC | PRN
  Administered 2022-07-27: 17 g via ORAL
  Filled 2022-07-26: qty 1

## 2022-07-26 MED ORDER — APIXABAN 5 MG PO TABS
5.0000 mg | ORAL_TABLET | Freq: Two times a day (BID) | ORAL | Status: DC
  Administered 2022-07-27 – 2022-07-29 (×5): 5 mg via ORAL
  Filled 2022-07-26 (×5): qty 1

## 2022-07-26 MED ORDER — HYDROMORPHONE HCL 1 MG/ML IJ SOLN
0.5000 mg | INTRAMUSCULAR | Status: DC | PRN
  Administered 2022-07-26: 0.5 mg via INTRAVENOUS
  Administered 2022-07-27 – 2022-07-29 (×3): 1 mg via INTRAVENOUS
  Filled 2022-07-26 (×4): qty 1

## 2022-07-26 MED ORDER — ONDANSETRON HCL 4 MG/2ML IJ SOLN: 4.0000 mg | Freq: Four times a day (QID) | INTRAMUSCULAR | Status: DC | PRN

## 2022-07-26 MED ORDER — LEVOTHYROXINE SODIUM 75 MCG PO TABS
150.0000 ug | ORAL_TABLET | ORAL | Status: DC
  Administered 2022-07-27 – 2022-07-29 (×3): 150 ug via ORAL
  Filled 2022-07-26 (×3): qty 2

## 2022-07-26 MED ORDER — PREDNISONE 20 MG PO TABS
40.0000 mg | ORAL_TABLET | Freq: Every day | ORAL | Status: DC
Start: 2022-07-26 — End: 2022-07-27
  Administered 2022-07-26 – 2022-07-27 (×2): 40 mg via ORAL
  Filled 2022-07-26 (×2): qty 2

## 2022-07-26 MED ORDER — POTASSIUM CHLORIDE CRYS ER 20 MEQ PO TBCR
40.0000 meq | EXTENDED_RELEASE_TABLET | Freq: Once | ORAL | Status: AC
  Administered 2022-07-26: 40 meq via ORAL
  Filled 2022-07-26: qty 2

## 2022-07-26 MED ORDER — ORAL CARE MOUTH RINSE: 15.0000 mL | OROMUCOSAL | Status: DC | PRN

## 2022-07-26 MED ORDER — HYDROMORPHONE HCL 1 MG/ML IJ SOLN
1.0000 mg | INTRAMUSCULAR | Status: DC | PRN
  Administered 2022-07-26 (×6): 1 mg via INTRAVENOUS
  Filled 2022-07-26 (×6): qty 1

## 2022-07-26 MED ORDER — APIXABAN 2.5 MG PO TABS: 5.0000 mg | ORAL_TABLET | Freq: Two times a day (BID) | ORAL | Status: DC

## 2022-07-26 MED ORDER — LEVOTHYROXINE SODIUM 75 MCG PO TABS: 225.0000 ug | ORAL_TABLET | ORAL | Status: DC

## 2022-07-26 MED ORDER — LIDOCAINE 5 % EX PTCH
1.0000 | MEDICATED_PATCH | CUTANEOUS | Status: DC
  Administered 2022-07-26 – 2022-07-28 (×3): 1 via TRANSDERMAL
  Filled 2022-07-26 (×5): qty 1

## 2022-07-26 MED ORDER — ONDANSETRON HCL 4 MG PO TABS: 4.0000 mg | ORAL_TABLET | Freq: Four times a day (QID) | ORAL | Status: DC | PRN

## 2022-07-26 MED ORDER — DOFETILIDE 250 MCG PO CAPS
500.0000 ug | ORAL_CAPSULE | Freq: Two times a day (BID) | ORAL | Status: DC
  Administered 2022-07-26 – 2022-07-29 (×7): 500 ug via ORAL
  Filled 2022-07-26 (×4): qty 2
  Filled 2022-07-26: qty 1
  Filled 2022-07-26: qty 2
  Filled 2022-07-26: qty 1
  Filled 2022-07-26: qty 2
  Filled 2022-07-26: qty 1
  Filled 2022-07-26: qty 2

## 2022-07-26 MED ORDER — METHOCARBAMOL 500 MG PO TABS
500.0000 mg | ORAL_TABLET | Freq: Four times a day (QID) | ORAL | Status: DC | PRN
  Administered 2022-07-26 – 2022-07-29 (×5): 500 mg via ORAL
  Filled 2022-07-26 (×6): qty 1

## 2022-07-26 MED ORDER — ACETAMINOPHEN 500 MG PO TABS
1000.0000 mg | ORAL_TABLET | Freq: Four times a day (QID) | ORAL | Status: DC | PRN
Start: 2022-07-26 — End: 2022-07-29
  Administered 2022-07-27 – 2022-07-29 (×6): 1000 mg via ORAL
  Filled 2022-07-26 (×6): qty 2

## 2022-07-26 MED ORDER — OXYCODONE HCL 5 MG PO TABS
5.0000 mg | ORAL_TABLET | Freq: Four times a day (QID) | ORAL | Status: DC | PRN
  Administered 2022-07-26 – 2022-07-29 (×7): 5 mg via ORAL
  Filled 2022-07-26 (×8): qty 1

## 2022-07-26 MED ORDER — ZOLPIDEM TARTRATE 5 MG PO TABS
5.0000 mg | ORAL_TABLET | Freq: Every evening | ORAL | Status: DC | PRN
Start: 2022-07-26 — End: 2022-07-29
  Administered 2022-07-26 – 2022-07-28 (×3): 5 mg via ORAL
  Filled 2022-07-26 (×3): qty 1

## 2022-07-26 NOTE — H&P (Addendum)
History and Physical    Emily Nunez HYW:[id number] DOB: 11/27/1961 DOA: 07/25/2022  PCP: Orpah Melter, MD   Patient coming from: Home    Chief Complaint: Severe back pain  HPI: Emily Nunez is a 61 y.o. female with medical history significant of paroxysmal A-fib on Tikosyn, chronic back pain who recently underwent L4-L5 decompression/fusion surgery on 8/25 by neurosurgery presented from home to [place] emergency department with complaints of severe increased lumbar pain, inability to walk, rating the pain as 10/ 10.  She also reported falling twice at home.  There is no report of bladder or bowel incontinence.   On presentation, she was found to be in rapid A-fib/a flutter also with rate as high as in the range of 150s.  Patient was asked for admission for management of intractable back pain, A-fib/a flutter.  Patient has not taken her Eliquis recently. Patient seen and examined on the floor this evening.  During my evaluation, her heart rate was well controlled.  Her rhythm was fluctuating between normal sinus and A-fib.  She was on Cardizem drip. There is no report of fever, chills, chest pain, shortness of breath, cough, abdomen pain, nausea, vomiting, headache, hematochezia or melena. Patient reported that her back pain is better now, rating as 6/10.  ED Course: Found to be in A-fib on presentation.  Started on IV fluid, Cardizem drip after which the rate was controlled.  Started on IV Dilaudid for severe back pain, started on muscle relaxants.  Review of Systems: As per HPI otherwise 10 point review of systems negative.    Past Medical History:  Diagnosis Date   Abnormality of right breast on screening mammogram 06/05/2019   Atrial fibrillation ([place])    Cancer ([place])    BASAL CELL /NOSE   Cancer ([place]) 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   Dysrhythmia    a. fib   Hypothyroid    Urinary incontinence    Vitamin D deficiency 09/2010   VIT D = 22    Past Surgical  History:  Procedure Laterality Date   BASAL CELL CARCINOMA EXCISION     BREAST LUMPECTOMY WITH RADIOACTIVE SEED LOCALIZATION Right 06/05/2019   Procedure: RIGHT BREAST LUMPECTOMY WITH RADIOACTIVE SEED LOCALIZATION;  Surgeon: Fanny Skates, MD;  Location: [place];  Service: General;  Laterality: Right;   BUBBLE STUDY  06/28/2021   Procedure: BUBBLE STUDY;  Surgeon: Fay Records, MD;  Location: [place];  Service: Cardiovascular;;   CARDIOVERSION N/A 06/28/2021   Procedure: CARDIOVERSION;  Surgeon: Fay Records, MD;  Location: [place];  Service: Cardiovascular;  Laterality: N/A;   COLONOSCOPY     DILATATION & CURETTAGE/HYSTEROSCOPY WITH MYOSURE N/A 06/25/2019   Procedure: [place];  Surgeon: Anastasio Auerbach, MD;  Location: [place];  Service: Gynecology;  Laterality: N/A;   ENDOMETRIAL ABLATION  2005   HYDROTHERMAL ABLATION   MAXIMUM ACCESS (MAS)POSTERIOR LUMBAR INTERBODY FUSION (PLIF) 3 LEVEL     PELVIC LAPAROSCOPY  04/2004   ENDO/CYST   TEE WITHOUT CARDIOVERSION N/A 06/28/2021   Procedure: TRANSESOPHAGEAL ECHOCARDIOGRAM (TEE);  Surgeon: Fay Records, MD;  Location: [facility] ENDOSCOPY;  Service: Cardiovascular;  Laterality: N/A;   [place]       reports that she has never smoked. She has never used smokeless tobacco. She reports that she does not currently use alcohol. She reports that she does not use drugs.  Allergies  Allergen Reactions   Vibegron Other (See Comments) and Nausea Only   Ciprofloxacin Nausea Only   Duloxetine Other (See Comments)    Exhaustion, nausea, headache   Gabapentin     Felt weird    Macrobid [Nitrofurantoin Monohydrate Macrocrystals] Nausea Only   Phentermine     Dizziness (intolerance)   Septra [Bactrim] Nausea Only   Sertraline Other (See Comments)    Unknown    Family History  Problem Relation Age of Onset   Cancer Mother         COLON   Heart disease Father      Prior to Admission medications   Medication Sig Start Date End Date Taking? Authorizing Provider  diltiazem (CARDIZEM CD) 240 MG 24 hr capsule Take 1 capsule (240 mg total) by mouth daily. 06/06/22 06/07/23 Yes Isaiah Serge, NP  acetaminophen (TYLENOL) 500 MG tablet Take 1,000 mg by mouth every 6 (six) hours as needed for mild pain or fever.    [provider]  apixaban (ELIQUIS) 5 MG TABS tablet TAKE 1 TABLET(5 MG) BY MOUTH TWICE DAILY 07/27/22   Consuella Lose, MD  Cyanocobalamin (B-12 PO) Take 1 tablet by mouth daily.    [provider]  cyclobenzaprine (FLEXERIL) 10 MG tablet Take 1 tablet (10 mg total) by mouth 3 (three) times daily as needed for muscle spasms. 07/23/22   Consuella Lose, MD  dofetilide (TIKOSYN) 500 MCG capsule Take 1 capsule (500 mcg total) by mouth 2 (two) times daily. 06/17/22   Sherran Needs, NP  levothyroxine (SYNTHROID) 150 MCG tablet Take 150-225 mcg by mouth See admin instructions. Take 150 mcg daily except take 225 mcg on Mon and Tues    [provider]  lidocaine 4 % Place 1 patch onto the skin daily as needed (pain).    [provider]  Omega-3 Fatty Acids (OMEGA 3 PO) Take 1,350 mg by mouth daily.    [provider]  oxyCODONE 10 MG TABS Take 1 tablet (10 mg total) by mouth every 6 (six) hours as needed for up to 7 days for severe pain ((score 7 to 10)). 07/23/22 07/30/22  Consuella Lose, MD  potassium chloride SA (KLOR-CON) 20 MEQ tablet Take 1 tablet (20 mEq total) by mouth daily. 09/23/21   Vickie Epley, MD  Vitamin D, Ergocalciferol, (DRISDOL) 1.25 MG (50000 UNIT) CAPS capsule Take 50,000 Units by mouth every Sunday. 01/15/21   [provider]  zolpidem (AMBIEN) 10 MG tablet Take 10 mg by mouth at bedtime.    [provider]    Physical Exam: Vitals:   07/26/22 1300 07/26/22 1330 07/26/22 1433 07/26/22 1434  BP: 109/70 117/73 108/64   Pulse:  92  92   Resp: '17 16 17   '$ Temp:   98.8 F (37.1 C)   TempSrc:   Oral   SpO2: 91%  96% 96%    Constitutional: Not in distress, obese Vitals:   07/26/22 1300 07/26/22 1330 07/26/22 1433 07/26/22 1434  BP: 109/70 117/73 108/64   Pulse: 92  92   Resp: '17 16 17   '$ Temp:   98.8 F (37.1 C)   TempSrc:   Oral   SpO2: 91%  96% 96%   Eyes: PERRL, lids and conjunctivae normal ENMT: Mucous membranes are moist.  Neck: normal, supple, no masses, no thyromegaly Respiratory: clear to auscultation bilaterally, no wheezing, no crackles. Normal respiratory effort. No accessory muscle use.  Cardiovascular: Irregularly irregular rhythm, no murmurs / rubs /  gallops. No extremity edema.  Abdomen: no tenderness, no masses palpated. No hepatosplenomegaly. Bowel sounds positive.  Musculoskeletal: no clubbing / cyanosis. No joint deformity upper and lower extremities.  Clean surgical wound on the lower back covered with dressing Skin: no rashes, lesions, ulcers. No induration Neurologic: CN 2-12 grossly intact.  Strength 5/5 in all 4.  Psychiatric: Normal judgment and insight. Alert and oriented x 3. Normal mood.   Foley Catheter:None  Labs on Admission: I have personally reviewed following labs and imaging studies  CBC: Recent Labs  Lab 07/25/22 1331 07/25/22 2234  WBC 8.9 8.0  NEUTROABS 5.5 5.5  HGB 10.7* 10.5*  HCT 33.2* 32.0*  MCV 90.5 89.1  PLT 264 601   Basic Metabolic Panel: Recent Labs  Lab 07/25/22 1331 07/25/22 2234 07/26/22 0126  NA 140 139  --   K 3.5 3.6  --   CL 104 102  --   CO2 26 28  --   GLUCOSE 107* 112*  --   BUN 22 20  --   CREATININE 0.86 0.81  --   CALCIUM 9.2 9.2  --   MG  --   --  1.9   GFR: Estimated Creatinine Clearance: 88.4 mL/min (by C-G formula based on SCr of 0.81 mg/dL). Liver Function Tests: Recent Labs  Lab 07/25/22 2234  AST 43*  ALT 24  ALKPHOS 64  BILITOT 0.6  PROT 6.9  ALBUMIN 3.5   No results for input(s): "LIPASE", "AMYLASE" in  the last 168 hours. No results for input(s): "AMMONIA" in the last 168 hours. Coagulation Profile: Recent Labs  Lab 07/25/22 2234  INR 1.1   Cardiac Enzymes: No results for input(s): "CKTOTAL", "CKMB", "CKMBINDEX", "TROPONINI" in the last 168 hours. BNP (last 3 results) No results for input(s): "PROBNP" in the last 8760 hours. HbA1C: No results for input(s): "HGBA1C" in the last 72 hours. CBG: No results for input(s): "GLUCAP" in the last 168 hours. Lipid Profile: No results for input(s): "CHOL", "HDL", "LDLCALC", "TRIG", "CHOLHDL", "LDLDIRECT" in the last 72 hours. Thyroid Function Tests: No results for input(s): "TSH", "T4TOTAL", "FREET4", "T3FREE", "THYROIDAB" in the last 72 hours. Anemia Panel: No results for input(s): "VITAMINB12", "FOLATE", "FERRITIN", "TIBC", "IRON", "RETICCTPCT" in the last 72 hours. Urine analysis:    Component Value Date/Time   COLORURINE YELLOW 08/14/2015 1639   APPEARANCEUR CLEAR 08/14/2015 1639   LABSPEC 1.025 08/14/2015 1639   PHURINE 5.0 08/14/2015 1639   GLUCOSEU NEGATIVE 08/14/2015 1639   HGBUR NEGATIVE 08/14/2015 1639   BILIRUBINUR NEGATIVE 08/14/2015 1639   KETONESUR NEGATIVE 08/14/2015 1639   PROTEINUR NEGATIVE 08/14/2015 1639   UROBILINOGEN 0.2 05/19/2014 1222   NITRITE NEGATIVE 08/14/2015 1639   LEUKOCYTESUR NEGATIVE 08/14/2015 1639    Radiological Exams on Admission: DG Lumbar Spine 1 View  Result Date: 07/26/2022 CLINICAL DATA:  Status post recent fusion surgery with multiple subsequent falls. EXAM: LUMBAR SPINE - 1 VIEW COMPARISON:  July 22, 2022 FINDINGS: There is no evidence of lumbar spine fracture. Bilateral radiopaque pedicle screws are seen at the levels of L4 and L5, with a radiopaque intervertebral disc spacer also noted at this level. Surgical hardware appears to be intact but is limited in evaluation on a single frontal view. Alignment is normal. Intervertebral disc spaces are maintained. IMPRESSION: Postoperative changes  at the level of L4-L5, as described above. Electronically Signed   By: Virgina Norfolk M.D.   On: 07/26/2022 00:44     Assessment/Plan Principal Problem:   Intractable back pain Active  Problems:   Hypothyroid   Spondylolisthesis at L4-L5 level   Atrial fibrillation with rapid ventricular response (HCC)  Intractable back pain: Acute on chronic back pain.She recently underwent L4-L5 decompression/fusion surgery on 8/25 by neurosurgery and was discharged on 8/26. Also reported fall at home, unable to walk.  No bladder or bowel incontinence, no numbness or tingling or loss of sensation. Will start on steroids which will help with intractable back pain.  Continue other pain management, muscle relaxants, lidocaine patch PT/OT will be consulted. Xray lumbar spine showed postoperative changes at the level of L4-L5, intact surgical hardware. Case was discussed with Dr. Zada Finders who said he will relay the information to Dr. Kathyrn Sheriff and Dr. Kathyrn Sheriff will see her in the rounds tomorrow  Paroxysmal A-fib/A-fib with RVR: Usually takes Eliquis, currently not taking due to recent back surgery.  Restarted on Eliquis.  On Tikosyn.  Started on Cardizem drip on presentation at the ED.  Monitor on telemetry.  Follows with cardiology. She was in and out with A-fib and  normal sinus rhythm during my evaluation at the bedside.  Continue IV Cardizem for tonight, can be transitioned to oral Cardizem if her heart rate remains stable.  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  Last echo showed EF of 50-55%, indeterminate left ventricular diastolic parameters.   Follows with cardiology.Appears euvolemic  Hypothyroidism: Continue synthyroid  Morbid obesity: BMI 36.6     Severity of Illness: The appropriate patient status for this patient is OBSERVATION.   DVT prophylaxis: Eliquis Code Status: Full Family Communication: None at bedside Consults called: Neurosurgery( Dr  Zada Finders)     Shelly Coss MD [facility]  07/26/2022, 5:14 PM

## 2022-07-26 NOTE — Progress Notes (Signed)
Plan of Care Note for accepted transfer   Patient: AIMY SWEETING MRN: [id number]   [place]: 07/25/2022  Facility requesting transfer: [facility] ED Requesting Provider: Dr. Florina Ou Reason for transfer: Rapid Atrial Fibrillation / [place] course:   61 year old female with past medical history of atrial fibrillation (on Tikosyn, Eliquis typically), hypothyroidism and recent L4-L5 decompression and fusion performed by Dr. Kathyrn Sheriff 8/25 who presents to [place] emergency department with complaints of severe low back pain.  Upon evaluation at [facility] emergency department patient was found to be in rapid atrial fibrillation with heart rates nearly as high as 160 beats a minute on arrival.  Of note, patient has been holding her Eliquis as of late in preparation for surgery and has yet to restart this medication.  EDP initiated intravenous diltiazem infusion with some improvement in rate and patient transitioning back and forth between atrial fibrillation and flutter.  Considering the patient's severe low back pain x-ray imaging of the back shows the hardware is in place.  EDP examined the surgical wound which appears intact with no clinical evidence of infection such as warmth or drainage.  EDP reports no clinical evidence of neurologic compromise with no weakness of the legs and no evidence of fecal or urinary incontinence.  Patient will be accepted for continued management of rapid atrial fibrillation.  Considering patient is typically on Tikosyn, patient may benefit from cardiology consultation upon arrival.  Furthermore, patient would certainly benefit from neurosurgery consultation in the morning considering patient's severe low back pain.   Plan of care: The patient is accepted for admission to Progressive unit, at [facility]..    Author: Vernelle Emerald, MD 07/26/2022  Check [email or web address] for on-call coverage.  Nursing staff, Please call [place] number on Amion as soon as patient's arrival, so appropriate admitting provider can evaluate the pt.

## 2022-07-26 NOTE — ED Notes (Signed)
Placed pt back on 2L via [place] for O2 sat 89%. Pt very relaxed after dilaudid admin.

## 2022-07-27 DIAGNOSIS — I4891 Unspecified atrial fibrillation: Secondary | ICD-10-CM | POA: Diagnosis present

## 2022-07-27 DIAGNOSIS — E669 Obesity, unspecified: Secondary | ICD-10-CM | POA: Diagnosis present

## 2022-07-27 DIAGNOSIS — G8929 Other chronic pain: Secondary | ICD-10-CM | POA: Diagnosis present

## 2022-07-27 DIAGNOSIS — Z7984 Long term (current) use of oral hypoglycemic drugs: Secondary | ICD-10-CM | POA: Diagnosis not present

## 2022-07-27 DIAGNOSIS — M5416 Radiculopathy, lumbar region: Secondary | ICD-10-CM | POA: Diagnosis present

## 2022-07-27 DIAGNOSIS — Z881 Allergy status to other antibiotic agents status: Secondary | ICD-10-CM | POA: Diagnosis not present

## 2022-07-27 DIAGNOSIS — Z79899 Other long term (current) drug therapy: Secondary | ICD-10-CM | POA: Diagnosis not present

## 2022-07-27 DIAGNOSIS — Z888 Allergy status to other drugs, medicaments and biological substances status: Secondary | ICD-10-CM | POA: Diagnosis not present

## 2022-07-27 DIAGNOSIS — I4892 Unspecified atrial flutter: Secondary | ICD-10-CM | POA: Diagnosis present

## 2022-07-27 DIAGNOSIS — E039 Hypothyroidism, unspecified: Secondary | ICD-10-CM | POA: Diagnosis present

## 2022-07-27 DIAGNOSIS — I480 Paroxysmal atrial fibrillation: Secondary | ICD-10-CM | POA: Diagnosis present

## 2022-07-27 DIAGNOSIS — Y92009 Unspecified place in unspecified non-institutional (private) residence as the place of occurrence of the external cause: Secondary | ICD-10-CM | POA: Diagnosis not present

## 2022-07-27 DIAGNOSIS — W19XXXA Unspecified fall, initial encounter: Secondary | ICD-10-CM | POA: Diagnosis present

## 2022-07-27 DIAGNOSIS — M549 Dorsalgia, unspecified: Secondary | ICD-10-CM | POA: Diagnosis not present

## 2022-07-27 DIAGNOSIS — N390 Urinary tract infection, site not specified: Secondary | ICD-10-CM | POA: Diagnosis present

## 2022-07-27 DIAGNOSIS — Z981 Arthrodesis status: Secondary | ICD-10-CM | POA: Diagnosis not present

## 2022-07-27 DIAGNOSIS — Z7901 Long term (current) use of anticoagulants: Secondary | ICD-10-CM | POA: Diagnosis not present

## 2022-07-27 DIAGNOSIS — Z8249 Family history of ischemic heart disease and other diseases of the circulatory system: Secondary | ICD-10-CM | POA: Diagnosis not present

## 2022-07-27 DIAGNOSIS — Z6836 Body mass index (BMI) 36.0-36.9, adult: Secondary | ICD-10-CM | POA: Diagnosis not present

## 2022-07-27 DIAGNOSIS — Z800 Family history of malignant neoplasm of digestive organs: Secondary | ICD-10-CM | POA: Diagnosis not present

## 2022-07-27 DIAGNOSIS — I420 Dilated cardiomyopathy: Secondary | ICD-10-CM | POA: Diagnosis present

## 2022-07-27 DIAGNOSIS — Z85828 Personal history of other malignant neoplasm of skin: Secondary | ICD-10-CM | POA: Diagnosis not present

## 2022-07-27 DIAGNOSIS — M4316 Spondylolisthesis, lumbar region: Secondary | ICD-10-CM | POA: Diagnosis present

## 2022-07-27 LAB — BASIC METABOLIC PANEL
Anion gap: 6 (ref 5–15)
BUN: 21 mg/dL (ref 8–23)
CO2: 26 mmol/L (ref 22–32)
Calcium: 9.4 mg/dL (ref 8.9–10.3)
Chloride: 105 mmol/L (ref 98–111)
Creatinine, Ser: 0.53 mg/dL (ref 0.44–1.00)
GFR, Estimated: >60 mL/min (ref >60)
Glucose, Bld: 172 mg/dL — ABNORMAL HIGH (ref 70–99)
Potassium: 4.2 mmol/L (ref 3.5–5.1)
Sodium: 137 mmol/L (ref 135–145)

## 2022-07-27 LAB — URINALYSIS, COMPLETE (UACMP) WITH MICROSCOPIC
Bilirubin Urine: NEGATIVE
Glucose, UA: 150 mg/dL — AB
Hgb urine dipstick: NEGATIVE
Ketones, ur: NEGATIVE mg/dL
Leukocytes,Ua: NEGATIVE
Nitrite: POSITIVE — AB
Protein, ur: NEGATIVE mg/dL
Specific Gravity, Urine: 1.018 (ref 1.005–1.030)
pH: 6 (ref 5.0–8.0)

## 2022-07-27 LAB — CBC
HCT: 30.3 % — ABNORMAL LOW (ref 36.0–46.0)
Hemoglobin: 9.8 g/dL — ABNORMAL LOW (ref 12.0–15.0)
MCH: 29.3 pg (ref 26.0–34.0)
MCHC: 32.3 g/dL (ref 30.0–36.0)
MCV: 90.7 fL (ref 80.0–100.0)
Platelets: 296 10*3/uL (ref 150–400)
RBC: 3.34 MIL/uL — ABNORMAL LOW (ref 3.87–5.11)
RDW: 13.4 % (ref 11.5–15.5)
WBC: 6.7 10*3/uL (ref 4.0–10.5)
nRBC: 0 % (ref 0.0–0.2)

## 2022-07-27 MED ORDER — DILTIAZEM HCL ER COATED BEADS 240 MG PO CP24
240.0000 mg | ORAL_CAPSULE | Freq: Every day | ORAL | Status: DC
  Administered 2022-07-27 – 2022-07-29 (×3): 240 mg via ORAL
  Filled 2022-07-27 (×3): qty 1

## 2022-07-27 NOTE — Progress Notes (Signed)
PROGRESS NOTE    Emily Nunez  XBM:[id number] DOB: 05/18/1961 DOA: 07/25/2022 PCP: Orpah Melter, MD   Brief Narrative:  HPI: Emily Nunez is a 61 y.o. female with medical history significant of paroxysmal A-fib on Tikosyn, chronic back pain who recently underwent L4-L5 decompression/fusion surgery on 8/25 by neurosurgery presented from home to [place] emergency department with complaints of severe increased lumbar pain, inability to walk, rating the pain as 10/ 10.  She also reported falling twice at home.  There is no report of bladder or bowel incontinence.   On presentation, she was found to be in rapid A-fib/a flutter also with rate as high as in the range of 150s.  Patient was asked for admission for management of intractable back pain, A-fib/a flutter.  Patient has not taken her Eliquis recently. Patient seen and examined on the floor this evening.  During my evaluation, her heart rate was well controlled.  Her rhythm was fluctuating between normal sinus and A-fib.  She was on Cardizem drip. There is no report of fever, chills, chest pain, shortness of breath, cough, abdomen pain, nausea, vomiting, headache, hematochezia or melena. Patient reported that her back pain is better now, rating as 6/10.   ED Course: Found to be in A-fib on presentation.  Started on IV fluid, Cardizem drip after which the rate was controlled.  Started on IV Dilaudid for severe back pain, started on muscle relaxants.  Assessment & Plan:   Principal Problem:   Intractable back pain Active Problems:   Hypothyroid   Spondylolisthesis at L4-L5 level   Atrial fibrillation with rapid ventricular response (HCC)  Intractable back pain: Acute on chronic back pain.She recently underwent L4-L5 decompression/fusion surgery on 8/25 by neurosurgery /Dr. Kathyrn Sheriff and was discharged on 8/26. Also reported fall at home, unable to walk.  No bladder or bowel incontinence, no numbness or tingling or loss  of sensation.  She is started on steroids, I am not sure if that is a good idea because that we will delay the healing process.  I will stop that.  Her pain is still the same like yesterday.  She has been seen by PT OT and now they recommend SNF.  Patient and daughter were informed this morning that neurosurgery has been consulted yesterday and that it is expected that they will see him however since patient was not seen until 2:20 PM, I called Dr. Kathyrn Sheriff and he basically told me that he was not planning to see the patient because there is not much for him to do and her pain is postoperative and expected.  I requested him to at least talk to the patient so patient has good understanding of what to expect.  He also mentioned that the patient's daughter has called multiple times to his office.  Once again, I requested him to at least call the family if he is not planning to see the patient.  He said he will do so.  Paroxysmal A-fib/A-fib with RVR: Needed to be started on Cardizem drip.  Currently only on 10 mg/h.  Rates under control.  Will resume home dose of long-acting p.o. diltiazem and wean from IV.  Continue Eliquis and Tikosyn.     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  Last echo showed EF of 50-55%, indeterminate left ventricular diastolic parameters.   Follows with cardiology.Appears euvolemic   Hypothyroidism: Continue synthyroid   Morbid obesity: BMI 36.6  DVT prophylaxis:   Eliquis  Code Status: Full Code  Family Communication: Daughter present at bedside.  Plan of care discussed with patient in length and he/she verbalized understanding and agreed with it.  Status is: Observation The patient will require care spanning > 2 midnights and should be moved to inpatient because: Still with back pain and A-fib with RVR   Estimated body mass index is 36.62 kg/m as calculated from the following:   Height as of this encounter: '5\' 6"'$  (1.676 m).   Weight as of  this encounter: 102.9 kg.    Nutritional Assessment: Body mass index is 36.62 kg/m.Marland Kitchen Seen by dietician.  I agree with the assessment and plan as outlined below: Nutrition Status:        . Skin Assessment: I have examined the patient's skin and I agree with the wound assessment as performed by the wound care RN as outlined below:    Consultants:  Neurosurgery-see discussion above.  Procedures:  None  Antimicrobials:  Anti-infectives (From admission, onward)    None         Subjective: Patient seen and examined.  Still complains of back pain.  Daughter at the bedside.  Patient denied any chest pain or shortness of breath.  She says her back pain radiates to her buttocks and posterior thighs bilaterally.  Objective: Vitals:   07/27/22 0900 07/27/22 1000 07/27/22 1022 07/27/22 1211  BP: 120/72 (!) 153/86 (!) 148/96 119/79  Pulse:    78  Resp: '16 14 17 14  '$ Temp:    98.2 F (36.8 C)  TempSrc:    Oral  SpO2:    92%  Weight:      Height:        Intake/Output Summary (Last 24 hours) at 07/27/2022 1420 Last data filed at 07/27/2022 1328 Gross per 24 hour  Intake 683.12 ml  Output 1700 ml  Net -1016.88 ml   Filed Weights   07/26/22 1715  Weight: 102.9 kg    Examination:  General exam: Appears calm and comfortable, obese Respiratory system: Clear to auscultation. Respiratory effort normal. Cardiovascular system: S1 & S2 heard, irregularly irregular rate and rhythm. No JVD, murmurs, rubs, gallops or clicks. No pedal edema. Gastrointestinal system: Abdomen is nondistended, soft and nontender. No organomegaly or masses felt. Normal bowel sounds heard. Central nervous system: Alert and oriented. No focal neurological deficits. Extremities: Symmetric 5 x 5 power. Skin: No rashes, lesions or ulcers Psychiatry: Judgement and insight appear normal. Mood & affect appropriate.    Data Reviewed: I have personally reviewed following labs and imaging  studies  CBC: Recent Labs  Lab 07/25/22 1331 07/25/22 2234 07/27/22 0501  WBC 8.9 8.0 6.7  NEUTROABS 5.5 5.5  --   HGB 10.7* 10.5* 9.8*  HCT 33.2* 32.0* 30.3*  MCV 90.5 89.1 90.7  PLT 264 275 762   Basic Metabolic Panel: Recent Labs  Lab 07/25/22 1331 07/25/22 2234 07/26/22 0126 07/27/22 0501  NA 140 139  --  137  K 3.5 3.6  --  4.2  CL 104 102  --  105  CO2 26 28  --  26  GLUCOSE 107* 112*  --  172*  BUN 22 20  --  21  CREATININE 0.86 0.81  --  0.53  CALCIUM 9.2 9.2  --  9.4  MG  --   --  1.9  --    GFR: Estimated Creatinine Clearance: 89.4 mL/min (by C-G formula based on SCr of 0.53 mg/dL). Liver Function Tests: Recent Labs  Lab 07/25/22  2234  AST 43*  ALT 24  ALKPHOS 64  BILITOT 0.6  PROT 6.9  ALBUMIN 3.5   No results for input(s): "LIPASE", "AMYLASE" in the last 168 hours. No results for input(s): "AMMONIA" in the last 168 hours. Coagulation Profile: Recent Labs  Lab 07/25/22 2234  INR 1.1   Cardiac Enzymes: No results for input(s): "CKTOTAL", "CKMB", "CKMBINDEX", "TROPONINI" in the last 168 hours. BNP (last 3 results) No results for input(s): "PROBNP" in the last 8760 hours. HbA1C: No results for input(s): "HGBA1C" in the last 72 hours. CBG: No results for input(s): "GLUCAP" in the last 168 hours. Lipid Profile: No results for input(s): "CHOL", "HDL", "LDLCALC", "TRIG", "CHOLHDL", "LDLDIRECT" in the last 72 hours. Thyroid Function Tests: No results for input(s): "TSH", "T4TOTAL", "FREET4", "T3FREE", "THYROIDAB" in the last 72 hours. Anemia Panel: No results for input(s): "VITAMINB12", "FOLATE", "FERRITIN", "TIBC", "IRON", "RETICCTPCT" in the last 72 hours. Sepsis Labs: No results for input(s): "PROCALCITON", "LATICACIDVEN" in the last 168 hours.  Recent Results (from the past 240 hour(s))  Surgical pcr screen     Status: None   Collection Time: 07/18/22 11:16 AM   Specimen: Nasal Mucosa; Nasal Swab  Result Value Ref Range Status   MRSA,  PCR NEGATIVE NEGATIVE Final   Staphylococcus aureus NEGATIVE NEGATIVE Final    Comment: (NOTE) The Xpert SA Assay (FDA approved for NASAL specimens in patients 81 years of age and older), is one component of a comprehensive surveillance program. It is not intended to diagnose infection nor to guide or monitor treatment. Performed at [facility], [place] [address] [place], [place] [address]      Radiology Studies: DG Lumbar Spine 1 View  Result Date: 07/26/2022 CLINICAL DATA:  Status post recent fusion surgery with multiple subsequent falls. EXAM: LUMBAR SPINE - 1 VIEW COMPARISON:  July 22, 2022 FINDINGS: There is no evidence of lumbar spine fracture. Bilateral radiopaque pedicle screws are seen at the levels of L4 and L5, with a radiopaque intervertebral disc spacer also noted at this level. Surgical hardware appears to be intact but is limited in evaluation on a single frontal view. Alignment is normal. Intervertebral disc spaces are maintained. IMPRESSION: Postoperative changes at the level of L4-L5, as described above. Electronically Signed   By: Virgina Norfolk M.D.   On: 07/26/2022 00:44    Scheduled Meds:  apixaban  5 mg Oral BID   dofetilide  500 mcg Oral BID   levothyroxine  150 mcg Oral Once per day on Sun Wed Thu Fri Sat   [START ON 08/01/2022] levothyroxine  225 mcg Oral Once per day on Mon Tue   lidocaine  1 patch Transdermal Q24H   predniSONE  40 mg Oral Q breakfast   Continuous Infusions:  diltiazem (CARDIZEM) infusion 10 mg/hr (07/27/22 1025)     LOS: 0 days   Darliss Cheney, MD [facility]  07/27/2022, 2:20 PM   *Please note that this is a verbal dictation therefore any spelling or grammatical errors are due to the "[place] One" system interpretation.  Please page via [place] and do not message via secure chat for urgent patient care matters. Secure chat can be used for non urgent patient care matters.  How to contact the [facility] Attending or  Consulting provider [place] or covering provider during after hours [place], for this patient?  Check the care team in [facility] and look for a) attending/consulting [facility] provider listed and b) the [facility] team listed. Page or  secure chat [phone]. Log into [email or web address] and use [place]'s universal password to access. If you do not have the password, please contact the hospital operator. Locate the [facility] provider you are looking for under [facility] and page to a number that you can be directly reached. If you still have difficulty reaching the provider, please page the [facility] (Director on Call) for the Hospitalists listed on amion for assistance.

## 2022-07-27 NOTE — Evaluation (Signed)
Occupational Therapy Evaluation Patient Details Name: Emily Nunez MRN: [id number] DOB: 01-12-1961 Today's Date: 07/27/2022   History of Present Illness 61 y/o female presented from home to [place] emergency department with complaints of severe increased lumbar pain, inability to walk, rating the pain as 10/10 and admitted 8/28 to [facility] for Paroxysmal A-fib/A-fib with RVR and intractable back pain.  Pt with recent admission 07/22/22 following PLIF L4-5. PMH: Afib   Clinical Impression   Mrs. Emily Nunez is a 61 year old woman admitted to hospital with above medical history and presents with generalized weakness, decreased activity tolerance, impaired balance, pain and altered mental status. Patient needing min guard assist to ambulate with walker and increased assistance with ADLs including mod-max assist for LB ADLs and toileting. Patient will benefit from skilled OT services while in hospital to improve deficits and learn compensatory strategies as needed in order to return home safely. Daughter reports impaired cognition and safety concerns due to pain medicines.       Recommendations for follow up therapy are one component of a multi-disciplinary discharge planning process, led by the attending physician.  Recommendations may be updated based on patient status, additional functional criteria and insurance authorization.   Follow Up Recommendations  Home health OT    Assistance Recommended at Discharge Frequent or constant Supervision/Assistance  Patient can return home with the following A little help with walking and/or transfers;A little help with bathing/dressing/bathroom;Assistance with cooking/housework;Direct supervision/assist for financial management;Assist for transportation;Help with stairs or ramp for entrance;Direct supervision/assist for medications management    Functional Status Assessment  Patient has had a recent decline in their functional status and  demonstrates the ability to make significant improvements in function in a reasonable and predictable amount of time.  Equipment Recommendations  None recommended by OT    Recommendations for Other Services       Precautions / Restrictions Precautions Precautions: Fall;Back Precaution Booklet Issued: Yes (comment) Precaution Comments: reviewed 3/3 precautions Required Braces or Orthoses: Spinal Brace Spinal Brace: Lumbar corset;Applied in sitting position Restrictions Weight Bearing Restrictions: No      Mobility Bed Mobility               General bed mobility comments: up in recliner    Transfers Overall transfer level: Needs assistance Equipment used: Rolling walker (2 wheels) Transfers: Sit to/from Stand Sit to Stand: Min guard           General transfer comment: min guard for safety to stand from chair and at sink. verbal cues for spinal precautions and safety      Balance Overall balance assessment: Mild deficits observed, not formally tested                                         ADL either performed or assessed with clinical judgement   ADL Overall ADL's : Needs assistance/impaired Eating/Feeding: Independent;Sitting   Grooming: Standing;Oral care;Supervision/safety Grooming Details (indicate cue type and reason): at sink with supervision Upper Body Bathing: Supervision/ safety;Sitting;Cueing for compensatory techniques   Lower Body Bathing: Moderate assistance;Sit to/from stand   Upper Body Dressing : Supervision/safety;Sitting   Lower Body Dressing: Maximal assistance;Sit to/from stand   Toilet Transfer: Min guard;BSC/3in1;Rolling walker (2 wheels)   Toileting- Water quality scientist and Hygiene: Moderate assistance;Cueing for back precautions;Sit to/from stand       Functional mobility during ADLs: Min guard;Rolling walker (  2 wheels) General ADL Comments: Patient familiar with AE and compensatory strategies via verbal  instruction but needs to perform tasks.     Vision Patient Visual Report: No change from baseline Vision Assessment?: No apparent visual deficits     Perception     Praxis      Pertinent Vitals/Pain Pain Assessment Pain Assessment: 0-10 Pain Score: 6  Pain Location: back Pain Descriptors / Indicators: Aching Pain Intervention(s): Monitored during session, Premedicated before session     Hand Dominance Right   Extremity/Trunk Assessment Upper Extremity Assessment Upper Extremity Assessment: Overall WFL for tasks assessed   Lower Extremity Assessment Lower Extremity Assessment: Defer to PT evaluation   Cervical / Trunk Assessment Cervical / Trunk Assessment: Back Surgery   Communication Communication Communication: No difficulties   Cognition Arousal/Alertness: Awake/alert Behavior During Therapy: WFL for tasks assessed/performed Overall Cognitive Status: Impaired/Different from baseline Area of Impairment: Memory, Safety/judgement, Problem solving                     Memory: Decreased short-term memory (loss of time during recent recovery)   Safety/Judgement: Decreased awareness of safety, Decreased awareness of deficits   Problem Solving: Slow processing, Requires verbal cues       General Comments       Exercises     Shoulder Instructions      Home Living Family/patient expects to be discharged to:: Private residence Living Arrangements: Spouse/significant other Available Help at Discharge: Family Type of Home: House Home Access: Stairs to enter Technical brewer of Steps: 7 Entrance Stairs-Rails: Right;Left;Can reach both Home Layout: Two level Alternate Level Stairs-Number of Steps: flight Alternate Level Stairs-Rails: Right;Left Bathroom Shower/Tub: Tub/shower unit;Walk-in shower   Bathroom Toilet: Standard     Home Equipment: Tub bench;Cane - single point;Hand held Conservation officer, nature (2 wheels);Shower seat           Prior Functioning/Environment Prior Level of Function : Independent/Modified Independent             Mobility Comments: 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  pt has been using RW s/p sugery, reports 2 falls upon d/c home ADLs Comments: increased difficulty with ADLs, safety, IADLs since discharge home.        OT Problem List: Decreased strength;Impaired balance (sitting and/or standing);Decreased range of motion;Decreased activity tolerance;Decreased knowledge of use of DME or AE;Decreased knowledge of precautions;Decreased safety awareness;Pain      OT Treatment/Interventions: Self-care/ADL training;DME and/or AE instruction;Therapeutic activities;Cognitive remediation/compensation;Balance training;Patient/family education    OT Goals(Current goals can be found in the care plan section) Acute Rehab OT Goals Patient Stated Goal: to be able to perform ADLs, be safe OT Goal Formulation: With patient/family Time For Goal Achievement: 08/10/22 Potential to Achieve Goals: Good  OT Frequency: Min 2X/week    Co-evaluation              AM-PAC OT "6 Clicks" Daily Activity     Outcome Measure Help from another person eating meals?: None Help from another person taking care of personal grooming?: A Little Help from another person toileting, which includes using toliet, bedpan, or urinal?: A Lot Help from another person bathing (including washing, rinsing, drying)?: A Lot Help from another person to put on and taking off regular upper body clothing?: A Little Help from another person to put on and taking off regular lower body clothing?: A Lot 6 Click Score: 16   End of Session Equipment  Utilized During Treatment: Rolling walker (2 wheels);Back brace Nurse Communication: Mobility status  Activity Tolerance: Patient tolerated treatment well Patient left: in chair;with call bell/phone within reach;with  family/visitor present;with chair alarm set  OT Visit Diagnosis: Unsteadiness on feet (R26.81);Muscle weakness (generalized) (M62.81);Pain                Time: 7158-0638 OT Time Calculation (min): 22 min Charges:  OT General Charges $OT Visit: 1 Visit OT Evaluation $OT Eval Low Complexity: 1 Low  Owais Pruett, OTR/L [place]  Office [phone] Pager: [place] 07/27/2022, 12:32 PM

## 2022-07-27 NOTE — Evaluation (Signed)
Physical Therapy Evaluation Patient Details Name: Emily Nunez MRN: [id number] DOB: Oct 15, 1961 Today's Date: 07/27/2022  History of Present Illness  61 y/o female presented from home to [place] emergency department with complaints of severe increased lumbar pain, inability to walk, rating the pain as 10/10 and admitted 8/28 to [facility] for Paroxysmal A-fib/A-fib with RVR and intractable back pain.  Pt with recent admission 07/22/22 following PLIF L4-5. PMH: Afib  Clinical Impression  Pt admitted with above diagnosis.  Pt currently with functional limitations due to the deficits listed below (see PT Problem List). Pt will benefit from skilled PT to increase their independence and safety with mobility to allow discharge to the venue listed below.  Pt requiring cues for safety and precautions.  Daughter present and reports pt was in a lot of pain at home and had a couple falls.  Daughter is taking care of pt's spouse/ her dad who has Stage IV cancer and reportedly not doing well.  Daughter feels pt could return home if mobile and she has more assist available.  Recommend SNF if assist is not available.  Daughter would like to speak with [facility] team about her options.         Recommendations for follow up therapy are one component of a multi-disciplinary discharge planning process, led by the attending physician.  Recommendations may be updated based on patient status, additional functional criteria and insurance authorization.  Follow Up Recommendations Skilled nursing-short term rehab (<3 hours/day) Can patient physically be transported by private vehicle: Yes    Assistance Recommended at Discharge Intermittent Supervision/Assistance  Patient can return home with the following  A little help with walking and/or transfers;A little help with bathing/dressing/bathroom;Help with stairs or ramp for entrance    Equipment Recommendations None recommended by PT  Recommendations for Other  Services       Functional Status Assessment Patient has had a recent decline in their functional status and demonstrates the ability to make significant improvements in function in a reasonable and predictable amount of time.     Precautions / Restrictions Precautions Precautions: Fall;Back Precaution Comments: reviewed 3/3 precautions Required Braces or Orthoses: Spinal Brace Spinal Brace: Lumbar corset;Applied in sitting position Restrictions Weight Bearing Restrictions: No      Mobility  Bed Mobility               General bed mobility comments: pt sitting EOB on arrival    Transfers Overall transfer level: Needs assistance Equipment used: Rolling walker (2 wheels) Transfers: Sit to/from Stand Sit to Stand: Min guard           General transfer comment: min/guard for safety, requiring cues for lines and using LEs not bending    Ambulation/Gait Ambulation/Gait assistance: Min guard Gait Distance (Feet): 400 Feet Assistive device: Rolling walker (2 wheels) Gait Pattern/deviations: Step-through pattern, Decreased stride length       General Gait Details: cues for safety, RW positioning, HR 116 bpm during activity  Stairs            Wheelchair Mobility    Modified Rankin (Stroke Patients Only)       Balance Overall balance assessment: Mild deficits observed, not formally tested, History of Falls                                           Pertinent Vitals/Pain Pain Assessment Pain  Assessment: 0-10 Pain Score: 6  Pain Location: back Pain Descriptors / Indicators: Sore, Grimacing, Discomfort Pain Intervention(s): Premedicated before session, Monitored during session, Repositioned    Home Living Family/patient expects to be discharged to:: Private residence Living Arrangements: Spouse/significant other Available Help at Discharge: Family Type of Home: House Home Access: Stairs to enter Entrance Stairs-Rails: Right;Left;Can  reach both Entrance Stairs-Number of Steps: 7 Alternate Level Stairs-Number of Steps: flight Home Layout: Two level Home Equipment: Tub bench;Cane - single point;Hand held shower head      Prior Function Prior Level of Function : Independent/Modified Independent             Mobility Comments: 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  pt has been using RW s/p sugery, reports 2 falls upon d/c home       Hand Dominance   Dominant Hand: Right    Extremity/Trunk Assessment        Lower Extremity Assessment Lower Extremity Assessment: Generalized weakness    Cervical / Trunk Assessment Cervical / Trunk Assessment: Back Surgery  Communication   Communication: No difficulties  Cognition Arousal/Alertness: Awake/alert Behavior During Therapy: Anxious                                   General Comments: pt presents with decreased safety awareness, decreased awareness of deficits, impulsive at times but will listen to safety cues, appears anxious with mobility; daughter reports increased pain at home and grogginess with pain meds        General Comments      Exercises     Assessment/Plan    PT Assessment Patient needs continued PT services  PT Problem List Decreased strength;Decreased balance;Decreased mobility;Decreased knowledge of use of DME;Pain;Decreased knowledge of precautions       PT Treatment Interventions Gait training;DME instruction;Therapeutic exercise;Balance training;Functional mobility training;Therapeutic activities;Patient/family education;Stair training    PT Goals (Current goals can be found in the Care Plan section)  Acute Rehab PT Goals PT Goal Formulation: With patient/family Time For Goal Achievement: 08/10/22 Potential to Achieve Goals: Good    Frequency Min 3X/week     Co-evaluation               AM-PAC PT "6 Clicks" Mobility  Outcome Measure Help  needed turning from your back to your side while in a flat bed without using bedrails?: A Little Help needed moving from lying on your back to sitting on the side of a flat bed without using bedrails?: A Little Help needed moving to and from a bed to a chair (including a wheelchair)?: A Little Help needed standing up from a chair using your arms (e.g., wheelchair or bedside chair)?: A Little Help needed to walk in hospital room?: A Little Help needed climbing 3-5 steps with a railing? : A Lot 6 Click Score: 17    End of Session Equipment Utilized During Treatment: Back brace Activity Tolerance: Patient tolerated treatment well Patient left: in chair;with call bell/phone within reach;with family/visitor present (with OT) Nurse Communication: Mobility status PT Visit Diagnosis: Difficulty in walking, not elsewhere classified (R26.2);Repeated falls (R29.6)    Time: 9983-3825 PT Time Calculation (min) (ACUTE ONLY): 27 min   Charges:   PT Evaluation $PT Eval Low Complexity: 1 Low PT Treatments $Gait Training: 8-22 mins       Jannette Spanner PT, DPT Physical Therapist Acute Rehabilitation Services Preferred  contact method: Secure Chat Weekend Pager Only: [phone] Office: [place] 07/27/2022, 12:11 PM

## 2022-07-27 NOTE — Plan of Care (Signed)
  Problem: Clinical Measurements: Goal: Ability to maintain clinical measurements within normal limits will improve Outcome: Progressing Goal: Will remain free from infection Outcome: Progressing Goal: Diagnostic test results will improve Outcome: Progressing Goal: Respiratory complications will improve Outcome: Progressing Goal: Cardiovascular complication will be avoided Outcome: Progressing   Problem: Nutrition: Goal: Adequate nutrition will be maintained Outcome: Progressing   Problem: Pain Managment: Goal: General experience of comfort will improve Outcome: Progressing   Problem: Skin Integrity: Goal: Risk for impaired skin integrity will decrease Outcome: Progressing   Problem: Education: Goal: Knowledge of disease or condition will improve Outcome: Progressing Goal: Understanding of medication regimen will improve Outcome: Progressing   Problem: Cardiac: Goal: Ability to achieve and maintain adequate cardiopulmonary perfusion will improve Outcome: Progressing   Problem: Education: Goal: Knowledge of General Education information will improve Description: Including pain rating scale, medication(s)/side effects and non-pharmacologic comfort measures Outcome: Completed/Met   Problem: Elimination: Goal: Will not experience complications related to urinary retention Outcome: Completed/Met

## 2022-07-27 NOTE — Progress Notes (Signed)
   07/27/22 1800  Provider Notification  Provider Name/Title Dr. Doristine Bosworth  Date Provider Notified 07/27/22  Time Provider Notified 1802  Method of Notification Page  Notification Reason Change in status (foul smelling urine, burning w/ urination)

## 2022-07-28 DIAGNOSIS — N390 Urinary tract infection, site not specified: Secondary | ICD-10-CM

## 2022-07-28 DIAGNOSIS — M549 Dorsalgia, unspecified: Secondary | ICD-10-CM | POA: Diagnosis not present

## 2022-07-28 MED ORDER — SODIUM CHLORIDE 0.9 % IV SOLN
1.0000 g | INTRAVENOUS | Status: DC
  Administered 2022-07-28 – 2022-07-29 (×2): 1 g via INTRAVENOUS
  Filled 2022-07-28 (×2): qty 10

## 2022-07-28 NOTE — Progress Notes (Signed)
I spoke with the patient today regarding her increase in pain requiring readmission after surgery. Time course suggests she likely has a postoperative radiculitis. I have reviewed her lumbar plain films which do not demonstrate any evidence of hardware failure or lumbar misalignment/fracture. Pt reports slow but steady improvement over the last few days. Pt would like to be discharged home in the next 24-48 hrs assuming she has home support and get outpatient PT/OT rather than pursuing SNF upon d/c. She will f/u with me in the office in 1-2 weeks.  Consuella Lose, MD [facility]

## 2022-07-28 NOTE — Progress Notes (Signed)
The patient reports discomfort at IV site. On exam, erythema right AC and surrounding tissue.Site is warm, edematous with a small amount of serous drainage at insertion site.  Provider updated

## 2022-07-28 NOTE — Progress Notes (Signed)
PROGRESS NOTE    Emily Nunez  EGB:[id number] DOB: [birth year] DOA: 07/25/2022 PCP: Orpah Melter, MD   Brief Narrative:  HPI: Emily Nunez is a 61 y.o. female with medical history significant of paroxysmal A-fib on Tikosyn, chronic back pain who recently underwent L4-L5 decompression/fusion surgery on 8/25 by neurosurgery presented from home to [place] emergency department with complaints of severe increased lumbar pain, inability to walk, rating the pain as 10/ 10.  She also reported falling twice at home.  There is no report of bladder or bowel incontinence.   On presentation, she was found to be in rapid A-fib/a flutter also with rate as high as in the range of 150s.  Patient was asked for admission for management of intractable back pain, A-fib/a flutter.  Patient has not taken her Eliquis recently. Patient seen and examined on the floor this evening.  During my evaluation, her heart rate was well controlled.  Her rhythm was fluctuating between normal sinus and A-fib.  She was on Cardizem drip. There is no report of fever, chills, chest pain, shortness of breath, cough, abdomen pain, nausea, vomiting, headache, hematochezia or melena. Patient reported that her back pain is better now, rating as 6/10.   ED Course: Found to be in A-fib on presentation.  Started on IV fluid, Cardizem drip after which the rate was controlled.  Started on IV Dilaudid for severe back pain, started on muscle relaxants.  Assessment & Plan:   Principal Problem:   Intractable back pain Active Problems:   Hypothyroid   Spondylolisthesis at L4-L5 level   Atrial fibrillation with rapid ventricular response (HCC)   Lumbar radiculopathy, acute  Intractable back pain: Acute on chronic back pain.She recently underwent L4-L5 decompression/fusion surgery on 8/25 by neurosurgery /Dr. Kathyrn Sheriff and was discharged on 8/26. Also reported fall at home, unable to walk.  No bladder or bowel incontinence,  no numbness or tingling or loss of sensation.  Dr. Kathyrn Sheriff has talked to the patient.  She is now convinced.  Her pain is improving.  PT OT recommends SNF.  TOC on board.  Paroxysmal A-fib/A-fib with RVR: Initially needed Cardizem drip.  Was weaned from that to oral Cardizem on the afternoon of 07/27/2022 . rates controlled.  Continue Cardizem p.o. and Tikosyn.   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  Last echo showed EF of 50-55%, indeterminate left ventricular diastolic parameters.   Follows with cardiology.Appears euvolemic  UTI: Patient is complaining of dysuria.  Urinalysis shows nitrites.  Likely UTI.  We will collect urine sample for culture and start on Rocephin.   Hypothyroidism: Continue synthyroid   Morbid obesity: BMI 36.6  DVT prophylaxis:   Eliquis   Code Status: Full Code  Family Communication: None present at bedside.  Plan of care discussed with patient in length and he/she verbalized understanding and agreed with it.  Status is: Inpatient Remains inpatient appropriate because: Waiting for placement, medically stable otherwise.     Estimated body mass index is 36.62 kg/m as calculated from the following:   Height as of this encounter: '5\' 6"'$  (1.676 m).   Weight as of this encounter: 102.9 kg.    Nutritional Assessment: Body mass index is 36.62 kg/m.Marland Kitchen Seen by dietician.  I agree with the assessment and plan as outlined below: Nutrition Status:        . Skin Assessment: I have examined the patient's skin and I agree with the wound assessment as performed by the wound care  RN as outlined below:    Consultants:  Neurosurgery-see discussion above.  Procedures:  None  Antimicrobials:  Anti-infectives (From admission, onward)    Start     Dose/Rate Route Frequency Ordered Stop   07/28/22 1000  cefTRIAXone (ROCEPHIN) 1 g in sodium chloride 0.9 % 100 mL IVPB        1 g 200 mL/hr over 30 Minutes Intravenous Every 24 hours  07/28/22 0837           Subjective:  Patient seen and examined.  Complains of burning urination.  Back pain is improving.  No other complaint.  Objective: Vitals:   07/27/22 1600 07/27/22 2058 07/28/22 0623 07/28/22 0848  BP: 116/80 (!) 158/78 135/66 113/62  Pulse: 86 100 82 83  Resp: '16 19 18 18  '$ Temp: 97.7 F (36.5 C) 98.2 F (36.8 C) 98.2 F (36.8 C)   TempSrc: Oral Oral Oral   SpO2: 99% 99% 96% 97%  Weight:      Height:        Intake/Output Summary (Last 24 hours) at 07/28/2022 1407 Last data filed at 07/28/2022 0956 Gross per 24 hour  Intake 385.37 ml  Output 400 ml  Net -14.63 ml    Filed Weights   07/26/22 1715  Weight: 102.9 kg    Examination:  General exam: Appears calm and comfortable  Respiratory system: Clear to auscultation. Respiratory effort normal. Cardiovascular system: S1 & S2 heard, RRR. No JVD, murmurs, rubs, gallops or clicks. No pedal edema. Gastrointestinal system: Abdomen is nondistended, soft and nontender. No organomegaly or masses felt. Normal bowel sounds heard. Central nervous system: Alert and oriented. No focal neurological deficits. Extremities: Symmetric 5 x 5 power. Skin: No rashes, lesions or ulcers.  Psychiatry: Judgement and insight appear normal. Mood & affect appropriate.   Data Reviewed: I have personally reviewed following labs and imaging studies  CBC: Recent Labs  Lab 07/25/22 1331 07/25/22 2234 07/27/22 0501  WBC 8.9 8.0 6.7  NEUTROABS 5.5 5.5  --   HGB 10.7* 10.5* 9.8*  HCT 33.2* 32.0* 30.3*  MCV 90.5 89.1 90.7  PLT 264 275 875    Basic Metabolic Panel: Recent Labs  Lab 07/25/22 1331 07/25/22 2234 07/26/22 0126 07/27/22 0501  NA 140 139  --  137  K 3.5 3.6  --  4.2  CL 104 102  --  105  CO2 26 28  --  26  GLUCOSE 107* 112*  --  172*  BUN 22 20  --  21  CREATININE 0.86 0.81  --  0.53  CALCIUM 9.2 9.2  --  9.4  MG  --   --  1.9  --     GFR: Estimated Creatinine Clearance: 89.4 mL/min (by C-G  formula based on SCr of 0.53 mg/dL). Liver Function Tests: Recent Labs  Lab 07/25/22 2234  AST 43*  ALT 24  ALKPHOS 64  BILITOT 0.6  PROT 6.9  ALBUMIN 3.5    No results for input(s): "LIPASE", "AMYLASE" in the last 168 hours. No results for input(s): "AMMONIA" in the last 168 hours. Coagulation Profile: Recent Labs  Lab 07/25/22 2234  INR 1.1    Cardiac Enzymes: No results for input(s): "CKTOTAL", "CKMB", "CKMBINDEX", "TROPONINI" in the last 168 hours. BNP (last 3 results) No results for input(s): "PROBNP" in the last 8760 hours. HbA1C: No results for input(s): "HGBA1C" in the last 72 hours. CBG: No results for input(s): "GLUCAP" in the last 168 hours. Lipid Profile: No results for input(s): "  CHOL", "HDL", "LDLCALC", "TRIG", "CHOLHDL", "LDLDIRECT" in the last 72 hours. Thyroid Function Tests: No results for input(s): "TSH", "T4TOTAL", "FREET4", "T3FREE", "THYROIDAB" in the last 72 hours. Anemia Panel: No results for input(s): "VITAMINB12", "FOLATE", "FERRITIN", "TIBC", "IRON", "RETICCTPCT" in the last 72 hours. Sepsis Labs: No results for input(s): "PROCALCITON", "LATICACIDVEN" in the last 168 hours.  No results found for this or any previous visit (from the past 240 hour(s)).    Radiology Studies: No results found.  Scheduled Meds:  apixaban  5 mg Oral BID   diltiazem  240 mg Oral Daily   dofetilide  500 mcg Oral BID   levothyroxine  150 mcg Oral Once per day on Sun Wed Thu Fri Sat   [START ON 08/01/2022] levothyroxine  225 mcg Oral Once per day on Mon Tue   lidocaine  1 patch Transdermal Q24H   Continuous Infusions:  cefTRIAXone (ROCEPHIN)  IV 1 g (07/28/22 1330)   diltiazem (CARDIZEM) infusion Stopped (07/27/22 1718)     LOS: 1 day   Emily Cheney, MD [facility]  07/28/2022, 2:07 PM   *Please note that this is a verbal dictation therefore any spelling or grammatical errors are due to the "[place] [employer]" system interpretation.  Please page  via [place] and do not message via secure chat for urgent patient care matters. Secure chat can be used for non urgent patient care matters.  How to contact the [facility] Attending or Consulting provider [place] or covering provider during after hours [place], for this patient?  Check the care team in [facility] and look for a) attending/consulting [facility] provider listed and b) the [facility] team listed. Page or secure chat [phone]. Log into [email or web address] and use [place]'s universal password to access. If you do not have the password, please contact the hospital operator. Locate the [facility] provider you are looking for under [facility] and page to a number that you can be directly reached. If you still have difficulty reaching the provider, please page the [facility] (Director on Call) for the Hospitalists listed on amion for assistance.

## 2022-07-28 NOTE — Progress Notes (Signed)
Occupational Therapy Treatment Patient Details Name: Emily Nunez MRN: [id number] DOB: 16-Jan-1961 Today's Date: 07/28/2022   History of present illness 61 y/o female presented from home to [place] emergency department with complaints of severe increased lumbar pain, inability to walk, rating the pain as 10/10 and admitted 8/28 to [facility] for Paroxysmal A-fib/A-fib with RVR and intractable back pain.  Pt with recent admission 07/22/22 following PLIF L4-5. PMH: Afib   OT comments  Treatment focused on self care independence. Patient able to ambulate to bathroom with walker in brace and perform pericare without breaking back precautions. Patient able to stand and brush hair, brush teeth and wash hands at sink. Therapist instructed patient on use of AE for LB dressing. Patient demonstrated use to don pants and socks. Discussed safety and compensatory strategies for return home. Patient making progress. Cont POC.    Recommendations for follow up therapy are one component of a multi-disciplinary discharge planning process, led by the attending physician.  Recommendations may be updated based on patient status, additional functional criteria and insurance authorization.    Follow Up Recommendations  Home health OT    Assistance Recommended at Discharge Frequent or constant Supervision/Assistance  Patient can return home with the following  A little help with walking and/or transfers;A little help with bathing/dressing/bathroom;Assistance with cooking/housework;Direct supervision/assist for financial management;Assist for transportation;Help with stairs or ramp for entrance;Direct supervision/assist for medications management   Equipment Recommendations  None recommended by OT    Recommendations for Other Services      Precautions / Restrictions Precautions Precautions: Fall;Back Precaution Comments: reviewed 3/3 precautions, log roll technique Required Braces or Orthoses: Spinal  Brace Spinal Brace: Lumbar corset;Applied in sitting position Restrictions Weight Bearing Restrictions: No       Mobility Bed Mobility                    Transfers                         Balance Overall balance assessment: Mild deficits observed, not formally tested                                         ADL either performed or assessed with clinical judgement   ADL Overall ADL's : Needs assistance/impaired     Grooming: Standing;Supervision/safety;Brushing hair;Wash/dry hands Grooming Details (indicate cue type and reason): Patient brushed hair and washed hands standing at sink in bathroom.             Lower Body Dressing: Modified independent;Sit to/from stand;With adaptive equipment   Toilet Transfer: Min guard;Rolling walker (2 wheels);Grab Environmental education officer and Hygiene: Supervision/safety;Sit to/from stand Toileting - Clothing Manipulation Details (indicate cue type and reason): Patient able to perform pericare wihtout assistance and maintaining back precautions.     Functional mobility during ADLs: Min guard;Rolling walker (2 wheels) General ADL Comments: Patient ambulated in room with walker with min guard    Extremity/Trunk Assessment Upper Extremity Assessment Upper Extremity Assessment: Overall WFL for tasks assessed       Cervical / Trunk Assessment Cervical / Trunk Assessment: Back Surgery    Vision Patient Visual Report: No change from baseline Vision Assessment?: No apparent visual deficits   Perception     Praxis      Cognition Arousal/Alertness: Awake/alert Behavior During Therapy: [facility]  for tasks assessed/performed Overall Cognitive Status: Within Functional Limits for tasks assessed                                          Exercises      Shoulder Instructions       General Comments      Pertinent Vitals/ Pain       Pain Assessment Pain  Assessment: Faces Faces Pain Scale: Hurts a little bit Pain Location: back Pain Descriptors / Indicators: Aching, Sore Pain Intervention(s): Monitored during session  Home Living                                          Prior Functioning/Environment              Frequency  Min 2X/week        Progress Toward Goals  OT Goals(current goals can now be found in the care plan section)  Progress towards OT goals: Progressing toward goals  Acute Rehab OT Goals Patient Stated Goal: to go home OT Goal Formulation: With patient/family Time For Goal Achievement: 08/10/22 Potential to Achieve Goals: Good  Plan Discharge plan remains appropriate    Co-evaluation                 AM-PAC OT "6 Clicks" Daily Activity     Outcome Measure   Help from another person eating meals?: None Help from another person taking care of personal grooming?: A Little Help from another person toileting, which includes using toliet, bedpan, or urinal?: A Little Help from another person bathing (including washing, rinsing, drying)?: A Little Help from another person to put on and taking off regular upper body clothing?: A Little Help from another person to put on and taking off regular lower body clothing?: A Little 6 Click Score: 19    End of Session Equipment Utilized During Treatment: Rolling walker (2 wheels);Back brace  OT Visit Diagnosis: Unsteadiness on feet (R26.81);Muscle weakness (generalized) (M62.81);Pain   Activity Tolerance Patient tolerated treatment well   Patient Left in bed;with call bell/phone within reach;with bed alarm set   Nurse Communication  (okay to see)        Time: 0258-5277 OT Time Calculation (min): 28 min  Charges: OT General Charges $OT Visit: 1 Visit OT Treatments $Self Care/Home Management : 23-37 mins  Emily Nunez, OTR/L [place]  Office [phone]   Emily Nunez 07/28/2022, 3:41 PM

## 2022-07-28 NOTE — Progress Notes (Signed)
Patient ambulated  180 feet. Activity tolerated well.

## 2022-07-28 NOTE — Progress Notes (Signed)
Lab contacted and technician reports urine culture added to sample and sent at 8 am.

## 2022-07-28 NOTE — NC FL2 (Signed)
[place] LEVEL OF CARE SCREENING TOOL     IDENTIFICATION  Patient Name: Emily Nunez Birthdate: [birth year] Sex: female Admission Date (Current Location): 07/25/2022  [facility] and [place] Number:  Herbalist and Address:  [facility],  [place], [place]      Provider Number: [id number]  Attending Physician Name and Address:  Darliss Cheney, MD  Relative Name and Phone Number:   Delrose Rohwer (dtr); cell [phone])    Current Level of Care: Hospital Recommended Level of Care: [place] Prior Approval Number:    Date Approved/Denied:   PASRR Number: [id number]  Discharge Plan: SNF    Current Diagnoses: Patient Active Problem List   Diagnosis Date Noted   Lumbar radiculopathy, acute 07/27/2022   Atrial fibrillation with rapid ventricular response ([place]) 07/26/2022   Intractable back pain 07/26/2022   Spondylolisthesis at L4-L5 level 07/22/2022   Afib ([place]) 08/03/2021   Persistent atrial fibrillation ([place]) 05/28/2021   Abnormality of right breast on screening mammogram 06/05/2019   Hypothyroid    Cancer ([place])    Vitamin D deficiency     Orientation RESPIRATION BLADDER Height & Weight     Self, Time, Situation, Place  Normal Continent Weight: 102.9 kg Height:  '5\' 6"'$  (167.6 cm)  BEHAVIORAL SYMPTOMS/MOOD NEUROLOGICAL BOWEL NUTRITION STATUS      Continent Diet (heart healthy diet)  AMBULATORY STATUS COMMUNICATION OF NEEDS Skin   Limited Assist Verbally Surgical wounds (surgical incision)                       Personal Care Assistance Level of Assistance  Bathing, Feeding, Dressing Bathing Assistance: Limited assistance Feeding assistance: Independent Dressing Assistance: Limited assistance     Functional Limitations Info  Sight, Hearing, Speech Sight Info: Impaired (reading glasses) Hearing Info: Adequate Speech Info: Adequate    SPECIAL CARE FACTORS FREQUENCY  PT (By licensed  PT), OT (By licensed OT)     PT Frequency:  (5 x week) OT Frequency:  (5x week)            Contractures Contractures Info: Not present    Additional Factors Info  Code Status, Allergies Code Status Info: Full Allergies Info: Vibegron, Ciprofloxacin, Duloxetine, Gabapentin, Macrobid (Nitrofurantoin Monohydrate Macrocrystals), Phentermine, Septra (Bactrim), Sertraline           Current Medications (07/28/2022):  This is the current hospital active medication list Current Facility-Administered Medications  Medication Dose Route Frequency Provider Last Rate Last Admin   acetaminophen (TYLENOL) tablet 1,000 mg  1,000 mg Oral Q6H PRN Shelly Coss, MD   1,000 mg at 07/27/22 2315   apixaban (ELIQUIS) tablet 5 mg  5 mg Oral BID Shelly Coss, MD   5 mg at 07/28/22 1100   cefTRIAXone (ROCEPHIN) 1 g in sodium chloride 0.9 % 100 mL IVPB  1 g Intravenous Q24H Pahwani, Einar Grad, MD       diltiazem (CARDIZEM CD) 24 hr capsule 240 mg  240 mg Oral Daily Pahwani, Ravi, MD   240 mg at 07/28/22 1100   diltiazem (CARDIZEM) 125 mg in dextrose 5% 125 mL (1 mg/mL) infusion  5-15 mg/hr Intravenous Continuous Shelly Coss, MD   Stopped at 07/27/22 1718   dofetilide (TIKOSYN) capsule 500 mcg  500 mcg Oral BID Shelly Coss, MD   500 mcg at 07/28/22 0842   HYDROmorphone (DILAUDID) injection 0.5-1 mg  0.5-1 mg Intravenous Q2H PRN Shelly Coss, MD   1 mg at  07/27/22 1750   levothyroxine (SYNTHROID) tablet 150 mcg  150 mcg Oral Once per day on Sun Wed Thu Fri Sat Shelly Coss, MD   150 mcg at 07/28/22 0500   [START ON 08/01/2022] levothyroxine (SYNTHROID) tablet 225 mcg  225 mcg Oral Once per day on Mon Tue Adhikari, Amrit, MD       lidocaine (LIDODERM) 5 % 1 patch  1 patch Transdermal Q24H Shelly Coss, MD   1 patch at 07/27/22 2100   methocarbamol (ROBAXIN) tablet 500 mg  500 mg Oral Q6H PRN Shelly Coss, MD   500 mg at 07/26/22 1930   ondansetron (ZOFRAN) tablet 4 mg  4 mg Oral Q6H PRN  Shelly Coss, MD       Or   ondansetron (ZOFRAN) injection 4 mg  4 mg Intravenous Q6H PRN Shelly Coss, MD       Oral care mouth rinse  15 mL Mouth Rinse PRN Adhikari, Amrit, MD       oxyCODONE (Oxy IR/ROXICODONE) immediate release tablet 5 mg  5 mg Oral Q6H PRN Shelly Coss, MD   5 mg at 07/28/22 0500   polyethylene glycol (MIRALAX / GLYCOLAX) packet 17 g  17 g Oral Daily PRN Shelly Coss, MD   17 g at 07/27/22 0925   zolpidem (AMBIEN) tablet 5 mg  5 mg Oral QHS PRN Mansy, Jan A, MD   5 mg at 07/27/22 2103     Discharge Medications: Please see discharge summary for a list of discharge medications.  Relevant Imaging Results:  Relevant Lab Results:   Additional Information SSN # [id number]  Henrietta Dine, RN

## 2022-07-28 NOTE — TOC Progression Note (Signed)
Transition of Care [facility]) - Progression Note    Patient Details  Name: Emily Nunez MRN: [id number] Date of Birth: Apr 11, 1961  Transition [facility]) CM/SW Contact  Antrone [place]a, Juliann Pulse, RN Phone Number: 07/28/2022, 4:18 PM  Clinical Narrative:     1. 1.3 mi [place] and [facility] [address] [place], [place] [address] [phone] Overall rating Above average 2. 1.6 mi [facility] [place], [place] [address] [phone] Overall rating Much below average 3. 2.1 mi [place], [place] [address] [phone] Overall rating Much below average 4. 2.5 mi [facility] [address] [place], [place] [address] [phone] Overall rating Much below average 5. 2.8 mi [place] [address] [place], [place] [address] [phone] Overall rating Much below average 6. 2.9 mi [facility] at the [place], [place] [address] [phone] Overall rating Above average 7. [place], [place] [address] [phone] Overall rating Above average 8. 3.6 [place] [address] [place], [place] [address] [phone] Overall rating Average 9. 3.6 mi [facility] [address] [place], [place] [address] [phone] Overall rating Much below average 10. 3.9 mi [facility] [place], [place] [address] [phone] Overall rating Much below average 11. 4.4 mi [facility] at [place], [place] [address] [phone] Overall rating Much above average 12. 5.5 mi [facility] [address] [place], [place] [address] [phone] Overall rating Above average 13. 8.2 [facility] [place], [place] [address] [phone] Overall rating Much above average 14. 9 mi [facility] [address] [place], [place] [address] [phone] Overall rating Above average 15. 9.1 mi [facility] and [place], [place] [address] [phone] Overall rating Above average 16. 9.2 mi [facility] [address] [place], [place] [address] [phone] Overall rating Much above average 17. 10.8 mi [place] [facility] [address] [place], [place] [address] [phone] Overall rating Much above average 18. 12.6 mi [facility] [address] [place], [place] [address] [phone] Overall rating Much below average 19. 12.8 [facility] [place], [place] [address] [phone] Overall rating Much below average 20. 14.2 mi [facility] [address] [place], [place] [address] [phone] Overall rating Average 21. 14.4 mi [facility] [place] [place], [place] [address] [phone] Overall rating Much above average 22. 14.8 mi [place] and [facility] [place], [place] [address] [phone] Overall rating Much above average 23. 14.9 [place] [address] [place], [place] [address] [phone] Overall rating Much below average 24. 16.5 mi [place] [address] [place], [place] [address] [phone] Overall rating Average 25. 16.7 mi [facility] [place], [place] [address] [phone] Overall rating Above average 26. 17.9 [facility], [place] [address] [phone] Overall rating Above average 27. 18.1 mi  [facility] and [place], [place] [address] [phone] Overall rating Much below average 28. 19.7 mi [facility] and [facility] [address] [place], [place] [address] [phone] Overall rating Much below average 29. 20 mi [address] at the [facility] at [facility], [place] [address] [phone] Overall rating Much above average 30. 21.1 mi [facility] and [place] [facility] [place], [place] [address] [phone] Overall rating Below average 31. 21.6 mi [address] [place], [place] [address] [phone] Overall rating Below average 32. 21.6 mi [facility] [address] [place], [place] [address] [phone] Overall rating Much below average 33. 21.8 [facility] [address] [facility] [place], [place] [address] [phone] Overall rating Much above average 34. [address] [place], [place] [address] [phone] Overall rating Much above average 35. 22.6 mi [facility] [address] [place], [place] [address] [phone] Overall rating Below average 36. 22.7 mi [facility] [place], [place] [address] [phone] Overall rating Much below average 37. 23.3 mi [facility] [address] [place], [place] [address] [phone] Overall rating Above average 38. 23.5 mi [place], [place] [address] [phone] Overall rating Not available18 39. 24.1 mi [place] [place] [address] [place], [place] [address] [phone] Overall rating Average 40. 24.2 mi [facility] and [facility] [address] [place], [place] [address] [phone] Overall rating Average 41. 24.4 [facility] Care[facility] [address] [place], [place] [address] [phone] Overall rating Much below average 42. 24.5 mi [facility] [place], [place] [address] [phone] Overall rating Average To explore and download nursing hom  Expected Discharge Plan: [place] Barriers to Discharge: Continued Medical Work up  Expected Discharge Plan and Services Expected Discharge Plan: [place]   Discharge Planning Services: CM Consult Post Acute Care Choice: [place] arrangements for the past 2 months: Single Family Home                                       Social Determinants of Health (SDOH) Interventions    Readmission Risk Interventions     No data to display

## 2022-07-28 NOTE — TOC Initial Note (Signed)
Transition of Care [facility]) - Initial/Assessment Note    Patient Details  Name: Emily Nunez MRN: [id number] Date of Birth: [birth year]  Transition [facility]) CM/SW Contact:    Henrietta Dine, RN Phone Number: 07/28/2022, 10:22 AM  Clinical Narrative:  discussed with patient recommendation for ST SNF; she verbalized understanding and agrees to [facility], prefers [facility]; will fax out for SNF; continue to follow.                Expected Discharge Plan: Skilled Nursing Facility Barriers to Discharge: Continued Medical Work up   Patient Goals and CMS Choice Patient states their goals for this hospitalization and ongoing recovery are::  (rehab) CMS Medicare.gov Compare Post Acute Care list provided to:: Patient Choice offered to / list presented to : Patient  Expected Discharge Plan and Services Expected Discharge Plan: [place]   Discharge Planning Services: CM Consult Post Acute Care Choice: [place] Living arrangements for the past 2 months: Single Family Home                     Prior Living Arrangements/Services Living arrangements for the past 2 months: Single Family Home Lives with:: Adult Children Patient language and need for interpreter reviewed:: Yes Do you feel safe going back to the place where you live?: Yes      Need for Family Participation in Patient Care: Yes (Comment) Care giver support system in place?: Yes (comment) Current home services: Other (comment) (n/a) Criminal Activity/Legal Involvement Pertinent to Current Situation/Hospitalization: No - Comment as needed  Activities of Daily Living Home Assistive Devices/Equipment: Eyeglasses, Walker (specify type), Blood pressure cuff, Scales ADL Screening (condition at time of admission) Patient's cognitive ability adequate to safely complete daily activities?: Yes Is the patient deaf or have difficulty hearing?: No Does the patient have difficulty seeing, even  when wearing glasses/contacts?: No Does the patient have difficulty concentrating, remembering, or making decisions?: No Patient able to express need for assistance with ADLs?: Yes Does the patient have difficulty dressing or bathing?: No Independently performs ADLs?: No Communication: Independent Dressing (OT): Needs assistance Is this a change from baseline?: Change from baseline, expected to last >3 days Grooming: Needs assistance Is this a change from baseline?: Change from baseline, expected to last >3 days Feeding: Independent Bathing: Needs assistance Is this a change from baseline?: Change from baseline, expected to last >3 days Toileting: Needs assistance Is this a change from baseline?: Change from baseline, expected to last >3days In/Out Bed: Needs assistance Is this a change from baseline?: Change from baseline, expected to last >3 days Walks in Home: Needs assistance Is this a change from baseline?: Change from baseline, expected to last >3 days Does the patient have difficulty walking or climbing stairs?: Yes Weakness of Legs: Both Weakness of Arms/Hands: None  Permission Sought/Granted Permission sought to share information with : Case Manager Permission granted to share information with : Yes, Verbal Permission Granted  Share Information with NAME: Case mamager           Emotional Assessment Appearance:: Appears stated age Attitude/Demeanor/Rapport: Gracious Affect (typically observed): Accepting Orientation: : Oriented to Self, Oriented to Place, Oriented to  Time, Oriented to Situation Alcohol / Substance Use: Not Applicable Psych Involvement: No (comment)  Admission diagnosis:  Atrial fibrillation with rapid ventricular response (HCC) [I48.91] Postoperative pain after spinal surgery [G89.18] Intractable back pain [M54.9] Lumbar radiculopathy, acute [M54.16] Patient Active Problem List   Diagnosis Date Noted  Lumbar radiculopathy, acute 07/27/2022    Atrial fibrillation with rapid ventricular response ([place]) 07/26/2022   Intractable back pain 07/26/2022   Spondylolisthesis at L4-L5 level 07/22/2022   Afib ([place]) 08/03/2021   Persistent atrial fibrillation ([place]) 05/28/2021   Abnormality of right breast on screening mammogram 06/05/2019   Hypothyroid    Cancer ([place])    Vitamin D deficiency    PCP:  Orpah Melter, MD Pharmacy:   [facility] [address] [place], [place]. AT [place]. [place] [address] Phone: [phone] Fax: [phone]     Social Determinants of Health (SDOH) Interventions    Readmission Risk Interventions     No data to display

## 2022-07-29 DIAGNOSIS — M549 Dorsalgia, unspecified: Secondary | ICD-10-CM | POA: Diagnosis not present

## 2022-07-29 MED ORDER — CEPHALEXIN 500 MG PO CAPS: 500.0000 mg | ORAL_CAPSULE | Freq: Three times a day (TID) | ORAL | 0 refills | Status: AC

## 2022-07-29 NOTE — TOC Transition Note (Addendum)
Transition of Care [facility]) - CM/SW Discharge Note   Patient Details  Name: Emily Nunez MRN: [id number] Date of Birth: August 14, 1961  [facility] CM/SW Contact:  Dessa Phi, RN Phone Number: 07/29/2022, 10:01 AM   Clinical Narrative:Patient declined SNF agree to [facility] to provide HHPT/OTcsw. Family to transport home.No further CM needs.    -11a-CM went rm to clarify d/c plans-dtr on speaker phone [place]-confirmed patient declines SNF want home w/HHC. No further CM needs.   Final next level of care: [place] Barriers to Discharge: No Barriers Identified   Patient Goals and CMS Choice Patient states their goals for this hospitalization and ongoing recovery are::  (rehab) CMS Medicare.gov Compare Post Acute Care list provided to:: Patient Choice offered to / list presented to : Patient  Discharge Placement                       Discharge Plan and Services   Discharge Planning Services: CM Consult Post Acute Care Choice: Skilled Nursing Facility                    HH Arranged: PT, OT, Social Work [facility] Agency: [facility] Date [facility] Agency Contacted: 07/29/22 Time [place]: 1001 Representative spoke with at [place]: [place] Determinants of Health ([place]) Interventions     Readmission Risk Interventions     No data to display

## 2022-07-29 NOTE — Progress Notes (Signed)
Physical Therapy Treatment Patient Details Name: Emily Nunez MRN: [id number] DOB: 05-Jan-1961 Today's Date: 07/29/2022   History of Present Illness 61 y/o female presented from home to [facility] emergency department with complaints of severe increased lumbar pain, inability to walk, rating the pain as 10/10 and admitted 8/28 to [facility] for Paroxysmal A-fib/A-fib with RVR and intractable back pain.  Pt with recent admission 07/22/22 following PLIF L4-5. PMH: Afib    PT Comments    Pt stating her pain meds were an hour late however RN reports pt received meds earlier and has been receiving pain meds as due.  Pt requiring assist for donning lumbar corset and cues for safety.  Recommended SNF in case daughter is unable to assist or have more assist at home.  If home, recommend pt has assist for mobility to maintain precautions and for safety as pt had falls prior to arrival.    Recommendations for follow up therapy are one component of a multi-disciplinary discharge planning process, led by the attending physician.  Recommendations may be updated based on patient status, additional functional criteria and insurance authorization.  Follow Up Recommendations  Skilled nursing-short term rehab (<3 hours/day) Can patient physically be transported by private vehicle: Yes   Assistance Recommended at Discharge Intermittent Supervision/Assistance  Patient can return home with the following A little help with walking and/or transfers;A little help with bathing/dressing/bathroom;Help with stairs or ramp for entrance   Equipment Recommendations  None recommended by PT    Recommendations for Other Services       Precautions / Restrictions Precautions Precautions: Fall;Back Precaution Comments: reviewed 3/3 precautions, log roll technique Required Braces or Orthoses: Spinal Brace Spinal Brace: Lumbar corset;Applied in sitting position     Mobility  Bed Mobility Overal bed mobility: Needs  Assistance Bed Mobility: Rolling, Sidelying to Sit, Sit to Sidelying Rolling: Min guard Sidelying to sit: Min guard     Sit to sidelying: Min guard General bed mobility comments: cues for log roll technique    Transfers Overall transfer level: Needs assistance Equipment used: Rolling walker (2 wheels) Transfers: Sit to/from Stand Sit to Stand: Min guard           General transfer comment: required assist to don lumbar corset correctly; verbal cues for spinal precautions and safety    Ambulation/Gait Ambulation/Gait assistance: Min guard Gait Distance (Feet): 350 Feet Assistive device: Rolling walker (2 wheels) Gait Pattern/deviations: Step-through pattern, Decreased stride length       General Gait Details: cues for safety, RW positioning   Stairs             Wheelchair Mobility    Modified Rankin (Stroke Patients Only)       Balance                                            Cognition Arousal/Alertness: Awake/alert Behavior During Therapy: WFL for tasks assessed/performed Overall Cognitive Status: Impaired/Different from baseline Area of Impairment: Memory, Safety/judgement, Problem solving                     Memory: Decreased short-term memory   Safety/Judgement: Decreased awareness of safety, Decreased awareness of deficits   Problem Solving: Slow processing, Requires verbal cues General Comments: pt didn't remember getting pain medication and requesting pain meds, cues for safety required, pt required assist donning lumbar corset  Exercises      General Comments        Pertinent Vitals/Pain Pain Assessment Pain Assessment: Faces Faces Pain Scale: Hurts a little bit Pain Location: back Pain Descriptors / Indicators: Aching, Sore Pain Intervention(s): Repositioned, Monitored during session (RN reports pt received pain meds)    Home Living                          Prior Function             PT Goals (current goals can now be found in the care plan section) Progress towards PT goals: Progressing toward goals    Frequency    Min 3X/week      PT Plan Current plan remains appropriate    Co-evaluation              AM-PAC PT "6 Clicks" Mobility   Outcome Measure  Help needed turning from your back to your side while in a flat bed without using bedrails?: A Little Help needed moving from lying on your back to sitting on the side of a flat bed without using bedrails?: A Little Help needed moving to and from a bed to a chair (including a wheelchair)?: A Little Help needed standing up from a chair using your arms (e.g., wheelchair or bedside chair)?: A Little Help needed to walk in hospital room?: A Little Help needed climbing 3-5 steps with a railing? : A Lot 6 Click Score: 17    End of Session Equipment Utilized During Treatment: Back brace Activity Tolerance: Patient tolerated treatment well Patient left: in bed;with call bell/phone within reach Nurse Communication: Mobility status PT Visit Diagnosis: Difficulty in walking, not elsewhere classified (R26.2);Repeated falls (R29.6)     Time: 2585-2778 PT Time Calculation (min) (ACUTE ONLY): 12 min  Charges:  $Gait Training: 8-22 mins                     Jannette Spanner PT, DPT Physical Therapist Acute Rehabilitation Services Preferred contact method: Secure Chat Weekend Pager Only: [phone] Office: [place] 07/29/2022, 2:01 PM

## 2022-07-29 NOTE — Discharge Summary (Signed)
PatientPhysician Discharge Summary  Emily Nunez KKX:[id number] DOB: 1961-10-07 DOA: 07/25/2022  PCP: Orpah Melter, MD  Admit date: 07/25/2022 Discharge date: 07/29/2022 30 Day Unplanned Readmission Risk Score    Flowsheet Row ED to Hosp-Admission (Current) from 07/25/2022 in [facility]  30 Day Unplanned Readmission Risk Score (%) 13.48 Filed at 07/29/2022 0801       This score is the patient's risk of an unplanned readmission within 30 days of being discharged (0 -100%). The score is based on dignosis, age, lab data, medications, orders, and past utilization.   Low:  0-14.9   Medium: 15-21.9   High: 22-29.9   Extreme: 30 and above          Admitted From: Home Disposition: Home  Recommendations for Outpatient Follow-up:  Follow up with PCP in 1-2 weeks Please obtain BMP/CBC in one week Follow-up neurosurgery as a scheduled Please follow up with your PCP on the following pending results: Unresulted Labs (From admission, onward)    None         Home Health: yes Equipment/Devices: None  Discharge Condition: Stable CODE STATUS: Full Diet recommendation: Cardiac  Subjective: Patient seen and examined.  Back pain is improving.  She knows that PT OT has recommended SNF.  She agreed to proceed with SNF yesterday but now she has changed her mind and she would instead like to go home and do outpatient therapy.  To be on the safe side, we are arranging home health for her.  She reassures me that she has enough help at home.  Brief/Interim Summary:  Emily Nunez is a 61 y.o. female with medical history significant of paroxysmal A-fib on Tikosyn, chronic back pain who recently underwent L4-L5 decompression/fusion surgery on 8/25 by neurosurgery presented from home to [place] emergency department with complaints of severe increased lumbar pain, inability to walk, rating the pain as 10/ 10. She also reported falling twice at  home.  There is no report of bladder or bowel incontinence.   On presentation, she was found to be in rapid A-fib/a flutter also with rate as high as in the range of 150s.  Patient was asked for admission for management of intractable back pain, A-fib/a flutter.  Patient has not taken her Eliquis recently. There is no report of fever, chills, chest pain, shortness of breath, cough, abdomen pain, nausea, vomiting, headache, hematochezia or melena.  Patient was admitted under hospital service, details below.   Intractable back pain: Acute on chronic back pain.She recently underwent L4-L5 decompression/fusion surgery on 8/25 by neurosurgery /Dr. Kathyrn Sheriff and was discharged on 8/26.  Dr. Kathyrn Sheriff has talked to the patient.  She is now convinced that her pain is expected postoperative pain but her pain is improving as well, she was seen by PT OT who recommended SNF, patient wants to go home instead.  She is being discharged today.   Paroxysmal A-fib/A-fib with RVR: Initially needed Cardizem drip.  Was weaned from that to oral Cardizem on the afternoon of 07/27/2022 . rates controlled.  Continue Cardizem p.o. and Tikosyn and Eliquis.   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  Last echo showed EF of 50-55%, indeterminate left ventricular diastolic parameters.   Follows with cardiology.Appears euvolemic   UTI: Patient on Rocephin.  Urine culture growing gram-negative rods.  Final identification and sensitivities are not completed yet.  She is allergic to fluoroquinolones, Bactrim DS and nitrofurantoin.  Keflex is the only choice  at this point in time.   Hypothyroidism: Continue synthyroid   Morbid obesity: BMI 36.6  Discharge plan was discussed with patient and/or family member and they verbalized understanding and agreed with it.  Discharge Diagnoses:  Principal Problem:   Intractable back pain Active Problems:   Hypothyroid   Spondylolisthesis at L4-L5 level   Atrial  fibrillation with rapid ventricular response (HCC)   Lumbar radiculopathy, acute   UTI (urinary tract infection)    Discharge Instructions  Discharge Instructions     Ambulatory referral to Physical Therapy   Complete by: As directed       Allergies as of 07/29/2022       Reactions   Vibegron Other (See Comments), Nausea Only   Ciprofloxacin Nausea Only   Duloxetine Other (See Comments)   Exhaustion, nausea, headache   Gabapentin    Felt weird    Macrobid [nitrofurantoin Monohydrate Macrocrystals] Nausea Only   Phentermine    Dizziness (intolerance)   Septra [bactrim] Nausea Only   Sertraline Other (See Comments)   Unknown        Medication List     TAKE these medications    acetaminophen 500 MG tablet Commonly known as: TYLENOL Take 1,000 mg by mouth every 6 (six) hours as needed for mild pain or fever.   apixaban 5 MG Tabs tablet Commonly known as: Eliquis TAKE 1 TABLET(5 MG) BY MOUTH TWICE DAILY What changed:  how much to take how to take this when to take this additional instructions   B-12 PO Take 500 mcg by mouth daily.   cephALEXin 500 MG capsule Commonly known as: KEFLEX Take 1 capsule (500 mg total) by mouth 3 (three) times daily for 5 days.   cyclobenzaprine 10 MG tablet Commonly known as: FLEXERIL Take 1 tablet (10 mg total) by mouth 3 (three) times daily as needed for muscle spasms.   diltiazem 240 MG 24 hr capsule Commonly known as: CARDIZEM CD Take 1 capsule (240 mg total) by mouth daily.   dofetilide 500 MCG capsule Commonly known as: TIKOSYN Take 1 capsule (500 mcg total) by mouth 2 (two) times daily.   levothyroxine 150 MCG tablet Commonly known as: SYNTHROID Take 150-225 mcg by mouth See admin instructions. Take 150 mcg daily except take 225 mcg on Mon and Tues   lidocaine 4 % Place 1 patch onto the skin daily as needed (pain).   OMEGA 3 PO Take 1,350 mg by mouth daily.   Oxycodone HCl 10 MG Tabs Take 1 tablet (10 mg  total) by mouth every 6 (six) hours as needed for up to 7 days for severe pain ((score 7 to 10)).   potassium chloride SA 20 MEQ tablet Commonly known as: KLOR-CON M Take 1 tablet (20 mEq total) by mouth daily.   Vitamin D (Ergocalciferol) 1.25 MG (50000 UNIT) Caps capsule Commonly known as: DRISDOL Take 50,000 Units by mouth every Sunday.   zolpidem 10 MG tablet Commonly known as: AMBIEN Take 10 mg by mouth at bedtime as needed for sleep.        Follow-up Information     Orpah Melter, MD Follow up in 1 week(s).   Specialty: Family Medicine Contact information: [place] [address] [phone]         Vickie Epley, MD .   Specialties: Cardiology, Radiology Contact information: [address] [place] [address] [phone]  Allergies  Allergen Reactions   Vibegron Other (See Comments) and Nausea Only   Ciprofloxacin Nausea Only   Duloxetine Other (See Comments)    Exhaustion, nausea, headache   Gabapentin     Felt weird    Macrobid [Nitrofurantoin Monohydrate Macrocrystals] Nausea Only   Phentermine     Dizziness (intolerance)   Septra [Bactrim] Nausea Only   Sertraline Other (See Comments)    Unknown    Consultations: Neurosurgery   Procedures/Studies: DG Lumbar Spine 1 View  Result Date: 07/26/2022 CLINICAL DATA:  Status post recent fusion surgery with multiple subsequent falls. EXAM: LUMBAR SPINE - 1 VIEW COMPARISON:  July 22, 2022 FINDINGS: There is no evidence of lumbar spine fracture. Bilateral radiopaque pedicle screws are seen at the levels of L4 and L5, with a radiopaque intervertebral disc spacer also noted at this level. Surgical hardware appears to be intact but is limited in evaluation on a single frontal view. Alignment is normal. Intervertebral disc spaces are maintained. IMPRESSION: Postoperative changes at the level of L4-L5, as described above. Electronically Signed   By:  Virgina Norfolk M.D.   On: 07/26/2022 00:44   DG Lumbar Spine 2-3 Views  Result Date: 07/22/2022 CLINICAL DATA:  Fluoroscopic assistance for lumbar fusion at the L4-L5 level EXAM: LUMBAR SPINE - 2-3 VIEW COMPARISON:  06/06/2022 FINDINGS: Fluoroscopic images show posterior fusion at the L4-L5 level. Intervertebral disc spacer is noted in place. There is mild anterolisthesis at L4-L5 level. Fluoroscopic time 14 seconds. Radiation dose 12.92 mGy. IMPRESSION: Fluoroscopic assistance was provided for lumbar fusion at L4-L5 level. Electronically Signed   By: Elmer Picker M.D.   On: 07/22/2022 12:07   DG O-ARM IMAGE ONLY/NO REPORT  Result Date: 07/22/2022 There is no Radiologist interpretation  for this exam.  DG C-Arm 1-60 Min-No Report  Result Date: 07/22/2022 Fluoroscopy was utilized by the requesting physician.  No radiographic interpretation.   DG C-Arm 1-60 Min-No Report  Result Date: 07/22/2022 Fluoroscopy was utilized by the requesting physician.  No radiographic interpretation.   DG C-Arm 1-60 Min-No Report  Result Date: 07/22/2022 Fluoroscopy was utilized by the requesting physician.  No radiographic interpretation.     Discharge Exam: Vitals:   07/29/22 0531 07/29/22 0752  BP: (!) 133/91 134/80  Pulse: 72 91  Resp: 19 16  Temp: (!) 97.5 F (36.4 C)   SpO2: 95% 99%   Vitals:   07/28/22 1527 07/28/22 2103 07/29/22 0531 07/29/22 0752  BP: 118/80 118/71 (!) 133/91 134/80  Pulse: 81 80 72 91  Resp: '18 18 19 16  '$ Temp:  98.2 F (36.8 C) (!) 97.5 F (36.4 C)   TempSrc:  Oral Oral   SpO2: 96% 97% 95% 99%  Weight:      Height:        General: Pt is alert, awake, not in acute distress Cardiovascular: RRR, S1/S2 +, no rubs, no gallops Respiratory: CTA bilaterally, no wheezing, no rhonchi Abdominal: Soft, NT, ND, bowel sounds + Extremities: no edema, no cyanosis    The results of significant diagnostics from this hospitalization (including imaging, microbiology,  ancillary and laboratory) are listed below for reference.     Microbiology: Recent Results (from the past 240 hour(s))  Urine Culture     Status: Abnormal (Preliminary result)   Collection Time: 07/27/22  8:55 PM   Specimen: Urine, Clean Catch  Result Value Ref Range Status   Specimen Description   Final    URINE, CLEAN CATCH Performed at [facility], [place] [address], [place], [place] [address]    Special Requests   Final    NONE Performed at [facility], [place] [address], [place], [place] [address]    Culture >=100,000 COLONIES/mL GRAM NEGATIVE RODS (A)  Final   Report Status PENDING  Incomplete     Labs: BNP (last 3 results) No results for input(s): "BNP" in the last 8760 hours. Basic Metabolic Panel: Recent Labs  Lab 07/25/22 1331 07/25/22 2234 07/26/22 0126 07/27/22 0501  NA 140 139  --  137  K 3.5 3.6  --  4.2  CL 104 102  --  105  CO2 26 28  --  26  GLUCOSE 107* 112*  --  172*  BUN 22 20  --  21  CREATININE 0.86 0.81  --  0.53  CALCIUM 9.2 9.2  --  9.4  MG  --   --  1.9  --    Liver Function Tests: Recent Labs  Lab 07/25/22 2234  AST 43*  ALT 24  ALKPHOS 64  BILITOT 0.6  PROT 6.9  ALBUMIN 3.5   No results for input(s): "LIPASE", "AMYLASE" in the last 168 hours. No results for input(s): "AMMONIA" in the last 168 hours. CBC: Recent Labs  Lab 07/25/22 1331 07/25/22 2234 07/27/22 0501  WBC 8.9 8.0 6.7  NEUTROABS 5.5 5.5  --   HGB 10.7* 10.5* 9.8*  HCT 33.2* 32.0* 30.3*  MCV 90.5 89.1 90.7  PLT 264 275 296   Cardiac Enzymes: No results for input(s): "CKTOTAL", "CKMB", "CKMBINDEX", "TROPONINI" in the last 168 hours. BNP: Invalid input(s): "POCBNP" CBG: No results for input(s): "GLUCAP" in the last 168 hours. D-Dimer No results for input(s): "DDIMER" in the last 72 hours. Hgb A1c No results for input(s): "HGBA1C" in the last 72 hours. Lipid Profile No results for input(s): "CHOL", "HDL", "LDLCALC",  "TRIG", "CHOLHDL", "LDLDIRECT" in the last 72 hours. Thyroid function studies No results for input(s): "TSH", "T4TOTAL", "T3FREE", "THYROIDAB" in the last 72 hours.  Invalid input(s): "FREET3" Anemia work up No results for input(s): "VITAMINB12", "FOLATE", "FERRITIN", "TIBC", "IRON", "RETICCTPCT" in the last 72 hours. Urinalysis    Component Value Date/Time   COLORURINE YELLOW 07/27/2022 2055   APPEARANCEUR CLEAR 07/27/2022 2055   LABSPEC 1.018 07/27/2022 2055   PHURINE 6.0 07/27/2022 2055   GLUCOSEU 150 (A) 07/27/2022 2055   HGBUR NEGATIVE 07/27/2022 2055   BILIRUBINUR NEGATIVE 07/27/2022 2055   KETONESUR NEGATIVE 07/27/2022 2055   PROTEINUR NEGATIVE 07/27/2022 2055   UROBILINOGEN 0.2 05/19/2014 1222   NITRITE POSITIVE (A) 07/27/2022 2055   LEUKOCYTESUR NEGATIVE 07/27/2022 2055   Sepsis Labs Recent Labs  Lab 07/25/22 1331 07/25/22 2234 07/27/22 0501  WBC 8.9 8.0 6.7   Microbiology Recent Results (from the past 240 hour(s))  Urine Culture     Status: Abnormal (Preliminary result)   Collection Time: 07/27/22  8:55 PM   Specimen: Urine, Clean Catch  Result Value Ref Range Status   Specimen Description   Final    URINE, CLEAN CATCH Performed at [facility], [place] [address], [place], [place] [address]    Special Requests   Final    NONE Performed at [facility], [place] [address], [place], [place] [address]    Culture >=100,000 COLONIES/mL Lonell Grandchild NEGATIVE RODS (A)  Final   Report Status PENDING  Incomplete     Time coordinating discharge: Over 30 minutes  SIGNED:   Darliss Cheney, MD  [facility] 07/29/2022,  9:35 AM *Please note that this is a verbal dictation therefore any spelling or grammatical errors are due to the "[place] [facility]" system interpretation. If 7PM-7AM, please contact night-coverage [email or web address]

## 2022-07-30 LAB — URINE CULTURE: Culture: >=100000 — AB

## 2022-08-30 ENCOUNTER — Encounter: Payer: Self-pay | Admitting: Physical Therapy

## 2022-08-30 ENCOUNTER — Ambulatory Visit: Payer: BC Managed Care – PPO | Attending: Family Medicine | Admitting: Physical Therapy

## 2022-08-30 DIAGNOSIS — M5459 Other low back pain: Secondary | ICD-10-CM | POA: Insufficient documentation

## 2022-08-30 DIAGNOSIS — M62830 Muscle spasm of back: Secondary | ICD-10-CM | POA: Insufficient documentation

## 2022-08-30 DIAGNOSIS — R262 Difficulty in walking, not elsewhere classified: Secondary | ICD-10-CM | POA: Insufficient documentation

## 2022-08-30 DIAGNOSIS — M5416 Radiculopathy, lumbar region: Secondary | ICD-10-CM | POA: Diagnosis not present

## 2022-08-30 DIAGNOSIS — M6281 Muscle weakness (generalized): Secondary | ICD-10-CM | POA: Diagnosis present

## 2022-08-30 NOTE — Therapy (Signed)
OUTPATIENT PHYSICAL THERAPY THORACOLUMBAR EVALUATION   Patient Name: Emily Nunez MRN: [id number] DOB:01-04-1961, 61 y.o., female Today's Date: 08/30/2022   PT End of Session - 08/30/22 0933     Visit Number 1    Date for PT Re-Evaluation 11/30/22    Authorization Type BCBS    PT Start Time 0930    PT Stop Time 3846    PT Time Calculation (min) 45 min    Equipment Utilized During Treatment Back brace    Activity Tolerance Patient tolerated treatment well    Behavior During Therapy [facility] for tasks assessed/performed             Past Medical History:  Diagnosis Date   Abnormality of right breast on screening mammogram 06/05/2019   Atrial fibrillation ([place])    Cancer ([place])    BASAL CELL /NOSE   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 fib   Hypothyroid    Urinary incontinence    Vitamin D deficiency 09/2010   VIT D = 22   Past Surgical History:  Procedure Laterality Date   BASAL CELL CARCINOMA EXCISION     BREAST LUMPECTOMY WITH RADIOACTIVE SEED LOCALIZATION Right 06/05/2019   Procedure: RIGHT BREAST LUMPECTOMY WITH RADIOACTIVE SEED LOCALIZATION;  Surgeon: Fanny Skates, MD;  Location: [facility];  Service: General;  Laterality: Right;   BUBBLE STUDY  06/28/2021   Procedure: BUBBLE STUDY;  Surgeon: Fay Records, MD;  Location: [facility];  Service: Cardiovascular;;   CARDIOVERSION N/A 06/28/2021   Procedure: CARDIOVERSION;  Surgeon: Fay Records, MD;  Location: [facility];  Service: Cardiovascular;  Laterality: N/A;   COLONOSCOPY     DILATATION & CURETTAGE/HYSTEROSCOPY WITH MYOSURE N/A 06/25/2019   Procedure: [place];  Surgeon: Anastasio Auerbach, MD;  Location: [facility];  Service: Gynecology;  Laterality: N/A;   ENDOMETRIAL ABLATION  2005   HYDROTHERMAL ABLATION   MAXIMUM ACCESS (MAS)POSTERIOR LUMBAR INTERBODY FUSION (PLIF) 3 LEVEL     PELVIC LAPAROSCOPY  04/2004    ENDO/CYST   TEE WITHOUT CARDIOVERSION N/A 06/28/2021   Procedure: TRANSESOPHAGEAL ECHOCARDIOGRAM (TEE);  Surgeon: Fay Records, MD;  Location: [facility];  Service: Cardiovascular;  Laterality: N/A;   [place]     Patient Active Problem List   Diagnosis Date Noted   UTI (urinary tract infection) 07/28/2022   Lumbar radiculopathy, acute 07/27/2022   Atrial fibrillation with rapid ventricular response ([place]) 07/26/2022   Intractable back pain 07/26/2022   Spondylolisthesis at L4-L5 level 07/22/2022   Afib ([place]) 08/03/2021   Persistent atrial fibrillation ([place]) 05/28/2021   Abnormality of right breast on screening mammogram 06/05/2019   Hypothyroid    Cancer ([place])    Vitamin D deficiency     PCP: Lyla Son PROVIDER: [place]  REFERRING DIAG: LBP  Rationale for Evaluation and Treatment Rehabilitation  THERAPY DIAG:  Other low back pain  Muscle spasm of back  Muscle weakness (generalized)  Difficulty in walking, not elsewhere classified  ONSET DATE: 07/22/22  SUBJECTIVE:  SUBJECTIVE STATEMENT: Patient underwent a decompression with a one level fusion 07/22/22, reports that she had back pain and issues for a number of years prior.  Reports that since the surgery the pain in there legs is better but is having left low back pain.  Reports that she is caring for her husband who has stage IV cancer PERTINENT HISTORY:  See above  PAIN:  Are you having pain? Yes: NPRS scale: 2/10 Pain location: left low back  Pain description: dull ache Aggravating factors: standing 5 minutes, putting on shoes and socks pain has been up to 8-9/10  Relieving factors: Tylenol 3x/day, heat pain 2/10   PRECAUTIONS: Back no bending lifting and twisting  WEIGHT BEARING  RESTRICTIONS No  FALLS:  Has patient fallen in last 6 months? Yes. Number of falls 2 legs went out  LIVING ENVIRONMENT: Lives with: lives with their family Lives in: House/apartment Stairs: Yes: Internal: 12 steps; on left going up Has following equipment at home: None  OCCUPATION: not working  PLOF: Independent, cares for husband  PATIENT GOALS be stronger, less pain, move better   OBJECTIVE:   SCREENING FOR RED FLAGS: Bowel or bladder incontinence: No Spinal tumors: No Cauda equina syndrome: No Compression fracture: No Abdominal aneurysm: No  COGNITION:  Overall cognitive status: Within functional limits for tasks assessed     SENSATION: WFL  MUSCLE LENGTH: Hamstrings: Right 60 deg; Left 70 deg Tight piriformis POSTURE: rounded shoulders, forward head, and wearing back brace  PALPATION: Very tight in the lumbar and cervical mms, tight in the buttocks, mild tenderness in the left lumbar and buttock  LUMBAR ROM:   All lumbar motions limited >75% due to precautions LOWER EXTREMITY ROM:     WFL's  LOWER EXTREMITY MMT:    MMT Right eval Left eval  Hip flexion 4 4-  Hip extension    Hip abduction 4 4-  Hip adduction    Hip internal rotation    Hip external rotation    Knee flexion 4 4  Knee extension 4 4  Ankle dorsiflexion 4 4  Ankle plantarflexion    Ankle inversion    Ankle eversion     (Blank rows = not tested)  FUNCTIONAL TESTS:  Timed up and go (TUG): 25  GAIT: Distance walked: 100 feet Assistive device utilized: None Level of assistance: Complete Independence Comments: small steps, narrow base of support, limp on the left, forward flexed trunk    TODAY'S TREATMENT  Issued HEP   PATIENT EDUCATION:  Education details: HEP below Person educated: Patient Education method: Explanation, Media planner, Corporate treasurer cues, Verbal cues, and Handouts Education comprehension: verbalized understanding   HOME EXERCISE PROGRAM: Access Code:  [email or web address] URL: [email or web address] Date: 08/30/2022 Prepared by: Lum Babe  Exercises - Hooklying Single Knee to Chest  - 2 x daily - 7 x weekly - 1 sets - 10 reps - 10 hold - Supine Double Knee to Chest  - 2 x daily - 7 x weekly - 1 sets - 10 reps - 10 hold - Supine Posterior Pelvic Tilt  - 1 x daily - 7 x weekly - 1 sets - 10 reps - 10 hold - Supine March with Posterior Pelvic Tilt  - 2 x daily - 7 x weekly - 1 sets - 10 reps - 3 hold - Supine Lower Trunk Rotation  - 2 x daily - 7 x weekly - 1 sets - 10 reps - 10 hold  ASSESSMENT:  CLINICAL IMPRESSION: Patient is a 61 y.o.  female who was seen today for physical therapy evaluation and treatment for LBP and weakness after lumbar fusion and decompression on 07/22/22.  She reports left lumbar pain, she has significant mm tightness in the lumbar and neck area. She does care for her husband and has a lot of stress.  Some mild weakness in the left LE, walking is stooped with small steps.    OBJECTIVE IMPAIRMENTS Abnormal gait, cardiopulmonary status limiting activity, decreased activity tolerance, decreased balance, decreased endurance, decreased mobility, difficulty walking, decreased ROM, decreased strength, increased muscle spasms, impaired flexibility, improper body mechanics, postural dysfunction, pain, and   .   REHAB POTENTIAL: Good  CLINICAL DECISION MAKING: Stable/uncomplicated  EVALUATION COMPLEXITY: Low   GOALS: Goals reviewed with patient? Yes  SHORT TERM GOALS: Target date: 09/13/22  Independent with initial HEP Goal status: INITIAL  LONG TERM GOALS: Target date: 11/22/2022  Understand posture and body mechanics Goal status: INITIAL  2.  Decrease pain 50% Goal status: INITIAL  3.  Walk without diffiuclty all community distances Goal status: INITIAL  4.  Increase lumbar ROM 25% Goal status: INITIAL  5.  Do housework and cooking without difficulty Goal status: INITIAL   PLAN: PT  FREQUENCY: 1-2x/week  PT DURATION: 12 weeks  PLANNED INTERVENTIONS: Therapeutic exercises, Therapeutic activity, Neuromuscular re-education, Balance training, Gait training, Patient/Family education, Self Care, Joint mobilization, Dry Needling, Electrical stimulation, Spinal mobilization, Cryotherapy, Moist heat, and Manual therapy.  PLAN FOR NEXT SESSION: slowly start activities after lumbar fusion   Ramil Edgington W, PT 08/30/2022, 9:34 AM

## 2022-09-08 ENCOUNTER — Ambulatory Visit: Payer: BC Managed Care – PPO | Admitting: Physical Therapy

## 2022-09-09 ENCOUNTER — Other Ambulatory Visit: Payer: Self-pay

## 2022-09-09 MED ORDER — POTASSIUM CHLORIDE CRYS ER 20 MEQ PO TBCR: 20.0000 meq | EXTENDED_RELEASE_TABLET | Freq: Every day | ORAL | 2 refills | Status: DC

## 2022-09-09 MED ORDER — DILTIAZEM HCL ER COATED BEADS 240 MG PO CP24: 240.0000 mg | ORAL_CAPSULE | Freq: Every day | ORAL | 2 refills | Status: DC

## 2022-09-09 NOTE — Addendum Note (Signed)
Addended by: Carter Kitten D on: 09/09/2022 09:53 AM   Modules accepted: Orders

## 2022-09-12 ENCOUNTER — Ambulatory Visit: Payer: BC Managed Care – PPO | Admitting: Physician Assistant

## 2022-09-13 ENCOUNTER — Ambulatory Visit: Payer: BC Managed Care – PPO | Admitting: Physical Therapy

## 2022-09-19 ENCOUNTER — Ambulatory Visit: Payer: BC Managed Care – PPO | Admitting: Physical Therapy

## 2022-09-23 ENCOUNTER — Ambulatory Visit: Payer: BC Managed Care – PPO

## 2022-09-27 NOTE — Progress Notes (Signed)
Cardiology Office Note Date:  09/27/2022  Patient ID:  Emily Nunez, Emily Nunez Dec 16, 1960, MRN [id number] PCP:  Orpah Melter, MD  Electrophysiologist: Dr. Quentin Ore    Chief Complaint:  Emily Nunez visit  History of Present Illness: Emily Nunez is a 61 y.o. 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 Quentin Ore, seen by him 09/23/21, doing well, planned for labs and EP APP f/u 24mo 676moith him.  I saw her Jan 2023 She got the flu and COVID in Oct and had a day of Afib with that, but the only event since on Tikosyn. Reports compliance with her medicines, no bleeding or signs of bleeding No CP, SOB, syncope Her husband has stage IV lung cancer with bone mets, this of course very difficult They are getting through, he is getting ready to restart immunosuppression tx again EKG/ATc was stable Labs updated, planned for a 3 mo f/u  She saw L. InDorene ArNP July 2023, struggling with back pain, needed surgery though still focused on her husband's care. No symptoms of AFib, EKG looked OK  Back surgery 07/22/22 PROCEDURE: 1. L4 laminectomy with facetectomy for decompression of exiting nerve roots, more than would be required for placement of interbody graft 2. Placement of anterior interbody device - Medtronic expandable 53m45mhort cage x 2 3. Posterior non-segmental instrumentation using cortical pedicle screws at L4 - L5 - Medtronic Solera 6.5 x 35 x3, 6.5 x 30 x1 4. Interbody arthrodesis, L4-5 5. Use of locally harvested bone autograft 6. Use of non-structural bone allograft - DBM, BMP 7. Use of intraoperative stereotaxy for navigation discharged 07/23/22, with instructions to resume Eliquis 07/27/22  Admitted 07/26/22, intractable back pain, was in rapid Afib, reportedly in/out AFib dlit gtt > PO. UTI treated with Keflex. Discharged 07/29/22   TODAY She is doing PT, making progress, no neuropathy pain RLE at all  any more, remains with pain L lateral thigh that her therapist thinks is local related, not back. Prior to her ER visit she thinks the last episode of Afib was about 53mo5mo, sounds fairly brief, did not last the whole day. No CP SOB No near syncope or syncope. No bleeding  Afib hx Diagnosed Jun 2022, incidentally pre-op eval Tikosyn started Sept 2022   Past Medical History:  Diagnosis Date   Abnormality of right breast on screening mammogram 06/05/2019   Atrial fibrillation (HCC)[place] Cancer (HCC)[place] BASAL CELL /NOSE   Cancer (HCC)[place]   BASAL CELL NOSE   Dysrhythmia    a. fib   Hypothyroid    Urinary incontinence    Vitamin D deficiency 09/2010   VIT D = 22    Past Surgical History:  Procedure Laterality Date   BASAL CELL CARCINOMA EXCISION     BREAST LUMPECTOMY WITH RADIOACTIVE SEED LOCALIZATION Right 06/05/2019   Procedure: RIGHT BREAST LUMPECTOMY WITH RADIOACTIVE SEED LOCALIZATION;  Surgeon: IngrFanny Skates;  [facility];  Laterality: Right;   BUBBLE STUDY  06/28/2021   Procedure: BUBBLE STUDY;  Surgeon: RossFay Records;  Location: [facility]isservice: Cardiovascular;;   CARDIOVERSION N/A 06/28/2021   Procedure: CARDIOVERSION;  Surgeon: RossFay Records;  Location: [facility]: Cardiovascular;  Laterality: N/A;   COLONOSCOPY     DILATATION & CURETTAGE/HYSTEROSCOPY WITH MYOSURE N/A 06/25/2019   Procedure: DILAPrudhoe Bayurgeon: FontPhineas Real  Belinda Block, MD;  Location: [facility];  Service: Gynecology;  Laterality: N/A;   ENDOMETRIAL ABLATION  2005   HYDROTHERMAL ABLATION   MAXIMUM ACCESS (MAS)POSTERIOR LUMBAR INTERBODY FUSION (PLIF) 3 LEVEL     PELVIC LAPAROSCOPY  04/2004   ENDO/CYST   TEE WITHOUT CARDIOVERSION N/A 06/28/2021   Procedure: TRANSESOPHAGEAL ECHOCARDIOGRAM (TEE);  Surgeon: Fay Records, MD;  Location: [place];  Service: Cardiovascular;   Laterality: N/A;   TONSILLECTOMY  1966   URETHRAL SLING      Current Outpatient Medications  Medication Sig Dispense Refill   acetaminophen (TYLENOL) 500 MG tablet Take 1,000 mg by mouth every 6 (six) hours as needed for mild pain or fever.     apixaban (ELIQUIS) 5 MG TABS tablet TAKE 1 TABLET(5 MG) BY MOUTH TWICE DAILY (Patient taking differently: Take 5 mg by mouth 2 (two) times daily.) 60 tablet 10   Cyanocobalamin (B-12 PO) Take 500 mcg by mouth daily.     cyclobenzaprine (FLEXERIL) 10 MG tablet Take 1 tablet (10 mg total) by mouth 3 (three) times daily as needed for muscle spasms. 30 tablet 0   diltiazem (CARDIZEM CD) 240 MG 24 hr capsule Take 1 capsule (240 mg total) by mouth daily. 90 capsule 2   dofetilide (TIKOSYN) 500 MCG capsule Take 1 capsule (500 mcg total) by mouth 2 (two) times daily. 180 capsule 1   levothyroxine (SYNTHROID) 150 MCG tablet Take 150-225 mcg by mouth See admin instructions. Take 150 mcg daily except take 225 mcg on Mon and Tues     lidocaine 4 % Place 1 patch onto the skin daily as needed (pain).     Omega-3 Fatty Acids (OMEGA 3 PO) Take 1,350 mg by mouth daily.     potassium chloride SA (KLOR-CON M) 20 MEQ tablet Take 1 tablet (20 mEq total) by mouth daily. 90 tablet 2   Vitamin D, Ergocalciferol, (DRISDOL) 1.25 MG (50000 UNIT) CAPS capsule Take 50,000 Units by mouth every Sunday.     zolpidem (AMBIEN) 10 MG tablet Take 10 mg by mouth at bedtime as needed for sleep.     No current facility-administered medications for this visit.    Allergies:   Vibegron, Ciprofloxacin, Duloxetine, Gabapentin, Macrobid [nitrofurantoin monohydrate macrocrystals], Phentermine, Septra [bactrim], and Sertraline   Social History:  The patient  reports that she has never smoked. She has never used smokeless tobacco. She reports that she does not currently use alcohol. She reports that she does not use drugs.   Family History:  The patient's family history includes Cancer in her  mother; Heart disease in her father.  ROS:  Please see the history of present illness.    All other systems are reviewed and otherwise negative.   PHYSICAL EXAM:  VS:  There were no vitals taken for this visit. BMI: There is no height or weight on file to calculate BMI. Well nourished, well developed, in no acute distress HEENT: normocephalic, atraumatic Neck: no JVD, carotid bruits or masses Cardiac:  RRR; no significant murmurs, no rubs, or gallops Lungs: CTA b/l, no wheezing, rhonchi or rales Abd: soft, nontender MS: no deformity or atrophy Ext: no edema Skin: warm and dry, no rash Neuro:  No gross deficits appreciated Psych: euthymic mood, full affect  EKG:  Done today and reviewed by myself shows  SR 85bpm, QTc 448m    08/04/21: TTE IMPRESSIONS   1. Left ventricular ejection fraction, by estimation, is 50 to 55%. The  left ventricle has  low normal function. The left ventricle has no regional  wall motion abnormalities. Left ventricular diastolic parameters are  indeterminate.   2. Right ventricular systolic function is normal. The right ventricular  size is normal. There is normal pulmonary artery systolic pressure. The  estimated right ventricular systolic pressure is 53.6 mmHg.   3. The mitral valve is normal in structure. Trivial mitral valve  regurgitation. No evidence of mitral stenosis.   4. The aortic valve is normal in structure. Aortic valve regurgitation is  trivial. No aortic stenosis is present.   5. The inferior vena cava is normal in size with greater than 50%  respiratory variability, suggesting right atrial pressure of 3 mmHg.    06/28/21:TEE=   1. The left ventricle has moderately decreased function.   2. Right ventricular systolic function is mildly reduced. The right  ventricular size is normal.   3. Left atrial size was mildly dilated. No left atrial/left atrial  appendage thrombus was detected.   4. The mitral valve is normal in structure. Trivial  mitral valve  regurgitation.   5. The aortic valve is tricuspid. Aortic valve regurgitation is trivial.   6. Agitated saline contrast bubble study was negative, with no evidence  of any interatrial shunt.   Recent Labs: 06/06/2022: TSH 0.756 07/25/2022: ALT 24 07/26/2022: Magnesium 1.9 07/27/2022: BUN 21; Creatinine, Ser 0.53; Hemoglobin 9.8; Platelets 296; Potassium 4.2; Sodium 137  No results found for requested labs within last 365 days.   CrCl cannot be calculated (Patient's most recent lab result is older than the maximum 21 days allowed.).   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  The patient did not have any concerns regarding medicines.  SVenetia Night PA-C 09/27/2022 7:17 AM     [facility] [address]SP[place] [phone](office)  [phone](fax)

## 2022-09-28 ENCOUNTER — Encounter: Payer: Self-pay | Admitting: Physician Assistant

## 2022-09-28 ENCOUNTER — Ambulatory Visit: Payer: BC Managed Care – PPO | Attending: Physician Assistant | Admitting: Physician Assistant

## 2022-09-28 VITALS — BP 110/74 | HR 85 | Ht 66.0 in | Wt 225.0 lb

## 2022-09-28 DIAGNOSIS — Z5181 Encounter for therapeutic drug level monitoring: Secondary | ICD-10-CM | POA: Diagnosis not present

## 2022-09-28 DIAGNOSIS — Z79899 Other long term (current) drug therapy: Secondary | ICD-10-CM

## 2022-09-28 DIAGNOSIS — I4819 Other persistent atrial fibrillation: Secondary | ICD-10-CM

## 2022-09-28 DIAGNOSIS — I420 Dilated cardiomyopathy: Secondary | ICD-10-CM | POA: Diagnosis not present

## 2022-09-28 NOTE — Patient Instructions (Signed)
Medication Instructions:   Your physician recommends that you continue on your current medications as directed. Please refer to the Current Medication list given to you today.  *If you need a refill on your cardiac medications before your next appointment, please call your pharmacy*   Lab Work: BMET  [place]    If you have labs (blood work) drawn today and your tests are completely normal, you will receive your results only by: [place] (if you have MyChart) OR A paper copy in the mail If you have any lab test that is abnormal or we need to change your treatment, we will call you to review the results.   Testing/Procedures: NONE ORDERED  TODAY    Follow-Up: At [facility], you and your health needs are our priority.  As part of our continuing mission to provide you with exceptional heart care, we have created designated Provider Care Teams.  These Care Teams include your primary Cardiologist (physician) and Advanced Practice Providers (APPs -  Physician Assistants and Nurse Practitioners) who all work together to provide you with the care you need, when you need it.  We recommend signing up for the patient portal called "MyChart".  Sign up information is provided on this After Visit Summary.  MyChart is used to connect with patients for Virtual Visits (Telemedicine).  Patients are able to view lab/test results, encounter notes, upcoming appointments, etc.  Non-urgent messages can be sent to your provider as well.   To learn more about what you can do with MyChart, go to [email or web address].    Your next appointment:   4 month(s)  The format for your next appointment:   In Person  Provider:   Lars Mage, MD    Other Instructions   Important Information About Sugar

## 2022-09-29 LAB — CBC
Hematocrit: 35.7 % (ref 34.0–46.6)
Hemoglobin: 11.4 g/dL (ref 11.1–15.9)
MCH: 27.7 pg (ref 26.6–33.0)
MCHC: 31.9 g/dL (ref 31.5–35.7)
MCV: 87 fL (ref 79–97)
Platelets: 286 10*3/uL (ref 150–450)
RBC: 4.11 x10E6/uL (ref 3.77–5.28)
RDW: 13.4 % (ref 11.7–15.4)
WBC: 6.7 10*3/uL (ref 3.4–10.8)

## 2022-09-29 LAB — BASIC METABOLIC PANEL
BUN/Creatinine Ratio: 20 (ref 12–28)
BUN: 19 mg/dL (ref 8–27)
CO2: 26 mmol/L (ref 20–29)
Calcium: 9.9 mg/dL (ref 8.7–10.3)
Chloride: 106 mmol/L (ref 96–106)
Creatinine, Ser: 0.93 mg/dL (ref 0.57–1.00)
Glucose: 138 mg/dL — ABNORMAL HIGH (ref 70–99)
Potassium: 4 mmol/L (ref 3.5–5.2)
Sodium: 146 mmol/L — ABNORMAL HIGH (ref 134–144)
eGFR: 70 mL/min/{1.73_m2} (ref >59)

## 2022-09-29 LAB — MAGNESIUM: Magnesium: 1.9 mg/dL (ref 1.6–2.3)

## 2022-10-04 ENCOUNTER — Encounter: Payer: Self-pay | Admitting: Obstetrics & Gynecology

## 2022-10-04 ENCOUNTER — Other Ambulatory Visit (HOSPITAL_COMMUNITY)
Admission: RE | Admit: 2022-10-04 | Discharge: 2022-10-04 | Disposition: A | Discharge status: Home / Self Care | Payer: BC Managed Care – PPO | Source: Ambulatory Visit | Attending: Obstetrics & Gynecology | Admitting: Obstetrics & Gynecology

## 2022-10-04 ENCOUNTER — Ambulatory Visit (INDEPENDENT_AMBULATORY_CARE_PROVIDER_SITE_OTHER): Payer: BC Managed Care – PPO | Admitting: Obstetrics & Gynecology

## 2022-10-04 VITALS — BP 110/76 | HR 90 | Ht 64.75 in | Wt 222.0 lb

## 2022-10-04 DIAGNOSIS — Z780 Asymptomatic menopausal state: Secondary | ICD-10-CM

## 2022-10-04 DIAGNOSIS — Z01419 Encounter for gynecological examination (general) (routine) without abnormal findings: Secondary | ICD-10-CM | POA: Insufficient documentation

## 2022-10-04 DIAGNOSIS — E6609 Other obesity due to excess calories: Secondary | ICD-10-CM | POA: Diagnosis not present

## 2022-10-04 NOTE — Progress Notes (Signed)
Emily Nunez 12/06/1960 [id number]   History:    61 y.o. G3P3L3.  Husband stage 4 Cancer, patient is the caregiver.   RP:  Established patient presenting for annual gyn exam    HPI:  Postmenopause, well on no HRT.  No PMB.  No pelvic pain. Abstinent currently.  Pap smear Neg 09/2021.  Pap reflex today.  Breasts normal.  Mammo scheduled 10/2022.  BMI 37.23.  Health labs with Fam MD. Colonoscopy 2023.  Flu shot done with pcp   Past medical history,surgical history, family history and social history were all reviewed and documented in the EPIC chart.  Gynecologic History No LMP recorded. Patient is postmenopausal.  Obstetric History OB History  Gravida Para Term Preterm AB Living  '3 3 3     3  '$ SAB IAB Ectopic Multiple Live Births          3    # Outcome Date GA Lbr Len/2nd Weight Sex Delivery Anes PTL Lv  3 Term     F Vag-Spont  N LIV  2 Term     F Vag-Spont  N LIV  1 Term     F Vag-Spont  N LIV     ROS: A ROS was performed and pertinent positives and negatives are included in the history. GENERAL: No fevers or chills. HEENT: No change in vision, no earache, sore throat or sinus congestion. NECK: No pain or stiffness. CARDIOVASCULAR: No chest pain or pressure. No palpitations. PULMONARY: No shortness of breath, cough or wheeze. GASTROINTESTINAL: No abdominal pain, nausea, vomiting or diarrhea, melena or bright red blood per rectum. GENITOURINARY: No urinary frequency, urgency, hesitancy or dysuria. MUSCULOSKELETAL: No joint or muscle pain, no back pain, no recent trauma. DERMATOLOGIC: No rash, no itching, no lesions. ENDOCRINE: No polyuria, polydipsia, no heat or cold intolerance. No recent change in weight. HEMATOLOGICAL: No anemia or easy bruising or bleeding. NEUROLOGIC: No headache, seizures, numbness, tingling or weakness. PSYCHIATRIC: No depression, no loss of interest in normal activity or change in sleep pattern.     Exam:   BP 110/76   Pulse 90   Ht 5' 4.75" (1.645  m)   Wt 222 lb (100.7 kg)   SpO2 99%   BMI 37.23 kg/m   Body mass index is 37.23 kg/m.  General appearance : Well developed well nourished female. No acute distress HEENT: Eyes: no retinal hemorrhage or exudates,  Neck supple, trachea midline, no carotid bruits, no thyroidmegaly Lungs: Clear to auscultation, no rhonchi or wheezes, or rib retractions  Heart: Regular rate and rhythm, no murmurs or gallops Breast:Examined in sitting and supine position were symmetrical in appearance, no palpable masses or tenderness,  no skin retraction, no nipple inversion, no nipple discharge, no skin discoloration, no axillary or supraclavicular lymphadenopathy Abdomen: no palpable masses or tenderness, no rebound or guarding Extremities: no edema or skin discoloration or tenderness  Pelvic: Vulva: Normal             Vagina: No gross lesions or discharge  Cervix: No gross lesions or discharge.  Pap reflex done.  Uterus  AV, normal size, shape and consistency, non-tender and mobile  Adnexa  Without masses or tenderness  Anus: Normal   Assessment/Plan:  61 y.o. female for annual exam   1. Encounter for routine gynecological examination with Papanicolaou smear of cervix Postmenopause, well on no HRT.  No PMB.  No pelvic pain. Abstinent currently.  Pap smear Neg 09/2021.  Pap reflex today.  Breasts normal.  Mammo scheduled 10/2022.  BMI 37.23.  Health labs with Fam MD. Colonoscopy 2023.  Flu shot done with pcp  - Cytology - PAP( [place])  2. Postmenopause Postmenopause, well on no HRT.  No PMB.  No pelvic pain. Abstinent currently.   3. Class 2 obesity due to excess calories without serious comorbidity with body mass index (BMI) of 37.0 to 37.9 in adult  Lower calorie/carb diet.  Recommend regular fitness activities.  Princess Bruins MD, 4:09 PM 10/04/2022

## 2022-10-07 LAB — CYTOLOGY - PAP
Diagnosis: NEGATIVE
Diagnosis: REACTIVE

## 2022-10-25 ENCOUNTER — Telehealth: Payer: Self-pay | Admitting: Physician Assistant

## 2022-10-25 NOTE — Telephone Encounter (Signed)
Yes she can take prednisone, no interaction with her Tikosyn.

## 2022-10-25 NOTE — Telephone Encounter (Signed)
Advised patient per pharmacist she can take prednisone, there is no interaction with Tykosyn. Patient verbalized understanding.

## 2022-10-25 NOTE — Telephone Encounter (Signed)
Pt c/o medication issue:  1. Name of Medication: Tikosyn  2. How are you currently taking this medication (dosage and times per day)? 2 times a day  3. Are you having a reaction (difficulty breathing--STAT)?   4. What is your medication issue? Wants to know if she can take Prednisone with her Tikosyn?

## 2022-11-08 ENCOUNTER — Ambulatory Visit
Admission: RE | Admit: 2022-11-08 | Discharge: 2022-11-08 | Disposition: A | Discharge status: Home / Self Care | Payer: BC Managed Care – PPO | Source: Ambulatory Visit | Attending: Obstetrics & Gynecology | Admitting: Obstetrics & Gynecology

## 2022-11-08 DIAGNOSIS — Z1231 Encounter for screening mammogram for malignant neoplasm of breast: Secondary | ICD-10-CM

## 2022-12-27 ENCOUNTER — Other Ambulatory Visit (HOSPITAL_COMMUNITY): Payer: Self-pay | Admitting: Nurse Practitioner

## 2023-01-05 ENCOUNTER — Encounter (HOSPITAL_COMMUNITY): Payer: Self-pay | Admitting: *Deleted

## 2023-01-31 ENCOUNTER — Ambulatory Visit: Payer: BC Managed Care – PPO | Admitting: Cardiology

## 2023-02-10 ENCOUNTER — Other Ambulatory Visit: Payer: Self-pay | Admitting: *Deleted

## 2023-02-10 DIAGNOSIS — I4819 Other persistent atrial fibrillation: Secondary | ICD-10-CM

## 2023-02-10 MED ORDER — APIXABAN 5 MG PO TABS: ORAL_TABLET | ORAL | 5 refills | Status: DC

## 2023-02-10 NOTE — Telephone Encounter (Signed)
Eliquis 5mg  refill request received. Patient is 62 years old, weight-100.7kg, Crea-0.93 on 09/28/22, Diagnosis-Afib, and last seen by Tommye Standard on 09/28/22. Dose is appropriate based on dosing criteria. Will send in refill to requested pharmacy.

## 2023-04-13 ENCOUNTER — Other Ambulatory Visit: Payer: Self-pay | Admitting: Sports Medicine

## 2023-04-13 DIAGNOSIS — M25551 Pain in right hip: Secondary | ICD-10-CM

## 2023-04-14 ENCOUNTER — Other Ambulatory Visit: Payer: Self-pay | Admitting: Sports Medicine

## 2023-04-14 ENCOUNTER — Ambulatory Visit
Admission: RE | Admit: 2023-04-14 | Discharge: 2023-04-14 | Disposition: A | Discharge status: Home / Self Care | Payer: BC Managed Care – PPO | Source: Ambulatory Visit | Attending: Sports Medicine | Admitting: Sports Medicine

## 2023-04-14 DIAGNOSIS — M25551 Pain in right hip: Secondary | ICD-10-CM

## 2023-04-26 ENCOUNTER — Encounter: Payer: Self-pay | Admitting: Obstetrics & Gynecology

## 2023-04-26 ENCOUNTER — Ambulatory Visit: Payer: BC Managed Care – PPO | Admitting: Obstetrics & Gynecology

## 2023-04-26 ENCOUNTER — Telehealth: Payer: Self-pay

## 2023-04-26 VITALS — BP 108/80 | HR 80

## 2023-04-26 DIAGNOSIS — F4321 Adjustment disorder with depressed mood: Secondary | ICD-10-CM | POA: Diagnosis not present

## 2023-04-26 MED ORDER — ESCITALOPRAM OXALATE 10 MG PO TABS: 10.0000 mg | ORAL_TABLET | Freq: Every day | ORAL | 5 refills | Status: DC

## 2023-04-26 MED ORDER — BUPROPION HCL ER (XL) 150 MG PO TB24: 150.0000 mg | ORAL_TABLET | Freq: Every day | ORAL | 5 refills | Status: DC

## 2023-04-26 NOTE — Progress Notes (Signed)
    Emily Nunez 02-21-1961 [id number]        62 y.o.  G3P3L3 Widowed.  Husband deceased from Lung Cancer.  RP: Counseling on HRT/management of depressive Sxs  HPI: Husband died of Lung Ca in 11/04/22.  Patient in the grieving process.  Situational depression.  Recently stopped her Grieving therapy.  No suicidal ideas or plans.  Not having much hot flushes or night sweats.  C/O joint pains.  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  Didn't respond to CS injections.  Appointment scheduled with Orthopedist at [facility], patient expects bilateral hip replacements in the near future.   OB History  Gravida Para Term Preterm AB Living  3 3 3     3   SAB IAB Ectopic Multiple Live Births          3    # Outcome Date GA Lbr Len/2nd Weight Sex Delivery Anes PTL Lv  3 Term     F Vag-Spont  N LIV  2 Term     F Vag-Spont  N LIV  1 Term     F Vag-Spont  N LIV    Past medical history,surgical history, problem list, medications, allergies, family history and social history were all reviewed and documented in the EPIC chart.   Directed ROS with pertinent positives and negatives documented in the history of present illness/assessment and plan.  Exam:  Vitals:   04/26/23 1111  BP: 108/80  Pulse: 80  SpO2: 98%   General appearance:  Normal  Gynecologic exam: Deferred   Assessment/Plan:  62 y.o. G3P3003   1. Situational depression Husband died of Lung Ca in 11-04-22.  Patient in the grieving process.  Situational depression.  Recently stopped her Grieving therapy.  No suicidal ideas or plans.  Not having much hot flushes or night sweats.  C/O joint pains.  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  Didn't respond to CS injections.  Appointment scheduled with Orthopedist at [facility], patient expects bilateral hip replacements in the near future. Counseling on management of situational depression/grieving.  Decision to start on Wellbutrin XL 150 mg PO daily (Allergy to Sertraline and med interaction with risk of  prolonged QT interval with Lexapro).  Risks/benefits/usage reviewed.  Prescription sent to pharmacy.  Recommend Psychotherapy as well.   HRT not recommended in the setting of probable bilateral hip replacement in the near future.  2. Grieving As above.  Other orders - MAGNESIUM PO; Take by mouth. - buPROPion (WELLBUTRIN XL) 150 MG 24 hr tablet; Take 1 tablet (150 mg total) by mouth daily.   Genia Del MD, 11:17 AM 04/26/2023

## 2023-04-26 NOTE — Telephone Encounter (Signed)
Pharmacy and patient notified and voiced understanding. Will route to provider for review and close.

## 2023-04-26 NOTE — Telephone Encounter (Signed)
-----   Message from Genia Del, MD sent at 04/26/2023 11:41 AM EDT ----- Regarding: Call Pharmacy [phone] to cancel Lexapro. Please call pharmacy to cancel Lexapro and fill Wellbutrin instead.  Prescription sent to pharmacy.  Also inform patient.  Reason to change to Wellbutrin:  Patient has an "Allergy" to Sertraline, which could happen with Lexapro, no details about the previous reaction.  Also, medication interaction with Lexapro that could cause prolonged QT interval at the level of the heart.

## 2023-04-27 ENCOUNTER — Encounter: Payer: Self-pay | Admitting: Obstetrics & Gynecology

## 2023-06-14 ENCOUNTER — Other Ambulatory Visit: Payer: Self-pay

## 2023-06-14 MED ORDER — POTASSIUM CHLORIDE CRYS ER 20 MEQ PO TBCR: 20.0000 meq | EXTENDED_RELEASE_TABLET | Freq: Every day | ORAL | 0 refills | Status: DC

## 2023-06-16 ENCOUNTER — Telehealth: Payer: Self-pay | Admitting: Cardiology

## 2023-06-16 NOTE — Telephone Encounter (Signed)
Returned pt's call. Pt states her numbers were high at her PCP and would like to do a lipid panel here with [place]. Pt states she was not fasting. Pt has appointment on August 1 to see Dr. Lalla Brothers.

## 2023-06-16 NOTE — Telephone Encounter (Signed)
Pt calling to request that an order for Lipid Panel be put in. She would like a callback regarding this matter. Please advise

## 2023-06-17 ENCOUNTER — Other Ambulatory Visit: Payer: Self-pay | Admitting: Cardiology

## 2023-06-19 NOTE — Telephone Encounter (Signed)
Left message for patient to call back  

## 2023-06-19 NOTE — Telephone Encounter (Signed)
Patient is following up. She would like to know if another doctor is able to place Lipid order if Dr. Lalla Brothers is not available. Please advise.

## 2023-06-29 ENCOUNTER — Encounter: Payer: Self-pay | Admitting: Cardiology

## 2023-06-29 ENCOUNTER — Ambulatory Visit: Payer: BC Managed Care – PPO | Attending: Cardiology | Admitting: Cardiology

## 2023-06-29 VITALS — BP 114/74 | HR 83 | Ht 66.0 in | Wt 240.0 lb

## 2023-06-29 DIAGNOSIS — E7849 Other hyperlipidemia: Secondary | ICD-10-CM | POA: Diagnosis not present

## 2023-06-29 DIAGNOSIS — I4819 Other persistent atrial fibrillation: Secondary | ICD-10-CM

## 2023-06-29 DIAGNOSIS — Z79899 Other long term (current) drug therapy: Secondary | ICD-10-CM | POA: Diagnosis not present

## 2023-06-29 DIAGNOSIS — I420 Dilated cardiomyopathy: Secondary | ICD-10-CM | POA: Diagnosis not present

## 2023-06-29 NOTE — Progress Notes (Signed)
  Electrophysiology Office Follow up Visit Note:    Date:  06/29/2023   ID:  Emily Nunez, DOB October 24, 1961, MRN [id number]  PCP:  Joycelyn Rua, MD  [facility] Cardiologist:  None  [facility] Electrophysiologist:  Lanier Prude, MD    Interval History:    Emily Nunez is a 62 y.o. female who presents for a follow up visit.   Last seen September 28, 2022 by [facility].  Her atrial fibrillation was well-controlled at that appointment.  Tikosyn was started for her in September 2022.  She is on Eliquis for stroke prophylaxis.  Today she is in pain.  She has an upcoming orthopedic surgery on her right hip.  She will also ultimately need her left hip replaced.      Past medical, surgical, social and family history were reviewed.  ROS:   Please see the history of present illness.    All other systems reviewed and are negative.  EKGs/Labs/Other Studies Reviewed:    The following studies were reviewed today:  September 28, 2022 creatinine 0.93  EKG Interpretation Date/Time:  Thursday June 29 2023 08:28:09 EDT Ventricular Rate:  83 PR Interval:  176 QRS Duration:  92 QT Interval:  402 QTC Calculation: 472 R Axis:   10  Text Interpretation: Sinus rhythm with occasional Premature ventricular complexes Confirmed by Steffanie Dunn [phone]) on 06/29/2023 8:29:18 AM    Physical Exam:    VS:  BP 114/74   Pulse 83   Ht 5\' 6"  (1.676 m)   Wt 240 lb (108.9 kg)   LMP  (LMP Unknown) Comment: not sexually active  SpO2 95%   BMI 38.74 kg/m     Wt Readings from Last 3 Encounters:  06/29/23 240 lb (108.9 kg)  10/04/22 222 lb (100.7 kg)  09/28/22 225 lb (102.1 kg)     GEN:  Well nourished, well developed in no acute distress uncomfortable appearing.  Obese CARDIAC: RRR, no murmurs, rubs, gallops RESPIRATORY:  Clear to auscultation without rales, wheezing or rhonchi       ASSESSMENT:    1. Persistent atrial fibrillation (HCC)   2. Encounter for long-term (current)  use of high-risk medication   3. Dilated cardiomyopathy (HCC)   4. Other hyperlipidemia    PLAN:    In order of problems listed above:  # Type persistent atrial fibrillation #High risk drug monitoring-Tikosyn Patient is doing well with Tikosyn. QTc acceptable for ongoing Tikosyn use Recent blood work at [facility] in June [place].  The patient has an upcoming orthopedic surgery at [facility].  Okay to hold the blood thinner for 3 days prior to the procedure with plans to restart it when felt safe from a postop perspective.  #Hyperlipidemia The patient brings in lipid panel from her primary care physician which shows an elevated LDL and total cholesterol.  She has been intolerant to statins in the past with diarrhea and muscle pain.  She tells me she has tried both rosuvastatin and atorvastatin.  I will put a referral in for our lipid clinic to consider alternative therapies.  Follow-up 4 months with APP    Signed, Steffanie Dunn, MD, [facility], [facility] 06/29/2023 8:41 AM    Electrophysiology [facility]

## 2023-06-29 NOTE — Patient Instructions (Signed)
Medication Instructions:  Your physician recommends that you continue on your current medications as directed. Please refer to the Current Medication list given to you today.  *If you need a refill on your cardiac medications before your next appointment, please call your pharmacy*  Follow-Up: At [facility], you and your health needs are our priority.  As part of our continuing mission to provide you with exceptional heart care, we have created designated Provider Care Teams.  These Care Teams include your primary Cardiologist (physician) and Advanced Practice Providers (APPs -  Physician Assistants and Nurse Practitioners) who all work together to provide you with the care you need, when you need it.  Your next appointment:   4 months  Provider:   You will see one of the following Advanced Practice Providers on your designated Care Team:   Francis Dowse, Charlott Holler [address]" [place], [place] Sherie Don, NP Canary Brim, NP

## 2023-07-13 ENCOUNTER — Ambulatory Visit: Payer: BC Managed Care – PPO | Attending: Cardiovascular Disease | Admitting: Student

## 2023-07-13 DIAGNOSIS — E7800 Pure hypercholesterolemia, unspecified: Secondary | ICD-10-CM

## 2023-07-13 MED ORDER — EZETIMIBE 10 MG PO TABS: 10.0000 mg | ORAL_TABLET | Freq: Every day | ORAL | 3 refills | Status: DC

## 2023-07-13 NOTE — Patient Instructions (Signed)
Your Results:             Your most recent labs Goal  Total Cholesterol 298 < 200  Triglycerides 140 < 150  HDL (happy/good cholesterol) 63 > 40  LDL (lousy/bad cholesterol 209 < 100   Medication changes: We will start the process to get PCSK9i (Repatha or Praluent)  covered by your insurance.  Once the prior authorization is complete, we will call you to let you know and confirm pharmacy information.    Praluent is a cholesterol medication that improved your body's ability to get rid of "bad cholesterol" known as LDL. It can lower your LDL up to 60%. It is an injection that is given under the skin every 2 weeks. The most common side effects of Praluent include runny nose, symptoms of the common cold, rarely flu or flu-like symptoms, back/muscle pain in about 3-4% of the patients, and redness, pain, or bruising at the injection site.    Repatha is a cholesterol medication that improved your body's ability to get rid of "bad cholesterol" known as LDL. It can lower your LDL up to 60%! It is an injection that is given under the skin every 2 weeks. The most common side effects of Repatha include runny nose, symptoms of the common cold, rarely flu or flu-like symptoms, back/muscle pain in about 3-4% of the patients, and redness, pain, or bruising at the injection site.   Lab orders: We want to repeat labs after 2-3 months.  We will send you a lab order to remind you once we get closer to that time.

## 2023-07-13 NOTE — Progress Notes (Signed)
Patient ID: MCKENSIE FOUNDS                 DOB: November 03, 1961                    MRN: [id number]      HPI: Emily Nunez is a 62 y.o. female patient referred to lipid clinic by Dr. Lalla Brothers . PMH is significant for PAF, dilated cardiomyopathy, dyslipidemia, hypothyroidism   Patient saw Dr. Lalla Brothers on 06/29/2023. Patient brought in her lipid results from PCP - LDLc and TC elevated. Patient reports she has been intolerant to multiple statins , including rosuvastatin and atorvastatin secondary to upset stomach and myalgia.   Patient presented today for lipid clinic.Reports she was put on Crestor first them Lipitor they both worsened her joint pain affected her mobility. Her joint pain improved in 2 weeks of discontinuing the therapy.she lost her husband earlier this year and she is dealing with depression. Her diet is not that great but she is slowing gaining control over that.  She is due for both hip replacement. Unable to exercise due to hip pain.  Go to physical therapy twice week comfortable on stationary bike.  Reviewed options for lowering LDL cholesterol, including ezetimibe, PCSK-9 inhibitors, bempedoic acid and inclisiran.  Discussed mechanisms of action, dosing, side effects and potential decreases in LDL cholesterol.  Also reviewed cost information and potential options for patient assistance.  Current Medications: none  Intolerances: Crestor, Lipitor - diarrhea, myalgia  Risk Factors: PAF, dilated cardiomyopathy, dyslipidemia, 10 years ASCVD risk score 3.9%  LDL goal: <100 mg /dl  Last LDLc 956 , TC 213, TG 140, HDL 63 (06/12/2023 -[facility])  Diet: on factor meal kit - usually 400-500 calories meal twice daily and protein shake in the morning  Snacks: none  Drink: water   Exercise: limited - hip pain Go to physical therapy twice a week - biking   Family History:  Relation Problem Comments  Mother (Deceased) Cancer COLON    Father (Deceased) Heart disease- CHF in his 53's valve replaced  at age 49      Social History:  Alcohol: none  Smoking: never    Past Medical History:  Diagnosis Date   Abnormality of right breast on screening mammogram 06/05/2019   Atrial fibrillation (HCC)    Cancer (HCC)    BASAL CELL /NOSE   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 fib   Hypothyroid    Urinary incontinence    Vitamin D deficiency 09/2010   VIT D = 22    Current Outpatient Medications on File Prior to Visit  Medication Sig Dispense Refill   acetaminophen (TYLENOL) 500 MG tablet Take 1,000 mg by mouth every 6 (six) hours as needed for mild pain or fever.     apixaban (ELIQUIS) 5 MG TABS tablet TAKE 1 TABLET(5 MG) BY MOUTH TWICE DAILY 60 tablet 5   diltiazem (CARDIZEM CD) 240 MG 24 hr capsule TAKE 1 CAPSULE(240 MG) BY MOUTH DAILY 30 capsule 3   dofetilide (TIKOSYN) 500 MCG capsule TAKE ONE CAPSULE BY MOUTH TWICE A DAY 180 capsule 3   levothyroxine (SYNTHROID) 150 MCG tablet Take 150-225 mcg by mouth See admin instructions. Take 150 mcg daily except take 225 mcg on Mon and Tues     MAGNESIUM PO Take by mouth.     Omega-3 Fatty Acids (OMEGA 3 PO) Take 1,350 mg by mouth daily.  potassium chloride SA (KLOR-CON M) 20 MEQ tablet Take 1 tablet (20 mEq total) by mouth daily. 90 tablet 0   Vitamin D, Ergocalciferol, (DRISDOL) 1.25 MG (50000 UNIT) CAPS capsule Take 50,000 Units by mouth every Sunday.     zolpidem (AMBIEN) 10 MG tablet Take 10 mg by mouth at bedtime as needed for sleep.     No current facility-administered medications on file prior to visit.    Allergies  Allergen Reactions   Vibegron Other (See Comments) and Nausea Only   Ciprofloxacin Nausea Only   Duloxetine Other (See Comments)    Exhaustion, nausea, headache   Gabapentin     Felt weird    Macrobid [Nitrofurantoin Monohydrate Macrocrystals] Nausea Only   Phentermine     Dizziness (intolerance)   Septra [Bactrim] Nausea Only   Sertraline Other (See Comments)    Unknown     Assessment/Plan:  1. Hyperlipidemia -  Problem  Hypercholesteremia   Current Medications: none  Intolerances: Crestor, Lipitor - diarrhea, myalgia  Risk Factors: PAF, dilated cardiomyopathy, dyslipidemia, 10 years ASCVD risk score 3.9%  LDL goal: <100 mg /dl  Last LDLc 272 , TC 536, TG 140, HDL 63 (06/12/2023 -[facility])    Hypercholesteremia Assessment:  LDL goal: <100  mg/dl last LDLc 644 mg/dl (03/47/4259) Intolerance to statins -Crestor, Lipitor - diarrhea, myalgia  Discussed next potential options (Ezetimibe,PCSK-9 inhibitors, bempedoic acid and inclisiran); cost, dosing efficacy, side effects  Reiterated importance of diet and exercise   Plan: Start taking ezetimibe 10 mg daily  Will apply for PA for PCSK9i; will inform patient upon approval  Lipid lab due in 2-3 months after starting PCSK9i    Thank you,  Carmela Hurt, Pharm.D [facility] A Division of [facility] [address] [place], [place] [address]  Phone: [phone]; Fax: [phone]

## 2023-07-14 ENCOUNTER — Telehealth: Payer: Self-pay | Admitting: Pharmacist

## 2023-07-14 ENCOUNTER — Other Ambulatory Visit (HOSPITAL_COMMUNITY): Payer: Self-pay

## 2023-07-14 ENCOUNTER — Telehealth: Payer: Self-pay | Admitting: Pharmacy Technician

## 2023-07-14 ENCOUNTER — Encounter: Payer: Self-pay | Admitting: Student

## 2023-07-14 DIAGNOSIS — E7800 Pure hypercholesterolemia, unspecified: Secondary | ICD-10-CM

## 2023-07-14 NOTE — Telephone Encounter (Signed)
Pharmacy Patient Advocate Encounter  Received notification from [facility] that Prior Authorization for Repatha SureClick 140MG /ML auto-injectors has been APPROVED from 07/14/23 to 07/12/24. Ran test claim, Copay is $30.00. This test claim was processed through [facility]- copay amounts may vary at other pharmacies due to pharmacy/plan contracts, or as the patient moves through the different stages of their insurance plan.   PA #/Case ID/Reference #: [id number]

## 2023-07-14 NOTE — Assessment & Plan Note (Signed)
Assessment:  LDL goal: <100  mg/dl last LDLc 454 mg/dl (09/81/1914) Intolerance to statins -Crestor, Lipitor - diarrhea, myalgia  Discussed next potential options (Ezetimibe,PCSK-9 inhibitors, bempedoic acid and inclisiran); cost, dosing efficacy, side effects  Reiterated importance of diet and exercise   Plan: Start taking ezetimibe 10 mg daily  Will apply for PA for PCSK9i; will inform patient upon approval  Lipid lab due in 2-3 months after starting [facility]

## 2023-07-17 MED ORDER — REPATHA SURECLICK 140 MG/ML ~~LOC~~ SOAJ: 140.0000 mg | SUBCUTANEOUS | 3 refills | Status: DC

## 2023-07-17 NOTE — Telephone Encounter (Signed)
PA approved for Repatha 

## 2023-07-17 NOTE — Addendum Note (Signed)
Addended by: Tylene Fantasia on: 07/17/2023 05:02 PM   Modules accepted: Orders

## 2023-07-17 NOTE — Telephone Encounter (Signed)
Patient informed about approval, will be starting both new cholesterol medication after hip replacement ( most likely beginning of Sept) will be due for 2nd hip replacement in Nov so wont be able to go for follow up lab until mid Dec.  Will call patient Mid Dec to remind for lab.

## 2023-08-16 ENCOUNTER — Other Ambulatory Visit: Payer: Self-pay | Admitting: Family Medicine

## 2023-08-16 DIAGNOSIS — Z1231 Encounter for screening mammogram for malignant neoplasm of breast: Secondary | ICD-10-CM

## 2023-08-21 ENCOUNTER — Other Ambulatory Visit: Payer: Self-pay

## 2023-08-21 DIAGNOSIS — I4819 Other persistent atrial fibrillation: Secondary | ICD-10-CM

## 2023-08-21 MED ORDER — APIXABAN 5 MG PO TABS
ORAL_TABLET | ORAL | 5 refills | Status: DC
Start: 2023-08-21 — End: 2024-03-11

## 2023-08-21 NOTE — Telephone Encounter (Signed)
Prescription refill request for Eliquis received. Indication:afib Last office visit:8/24 Scr:1.0  6/24 Age: 62 Weight:108.9  kg  Prescription refilled

## 2023-09-11 ENCOUNTER — Telehealth: Payer: Self-pay | Admitting: Cardiology

## 2023-09-11 NOTE — Telephone Encounter (Signed)
*  STAT* If patient is at the pharmacy, call can be transferred to refill team.   1. Which medications need to be refilled? (please list name of each medication and dose if known) diltiazem (CARDIZEM CD) 240 MG 24 hr capsule    2. Would you like to learn more about the convenience, safety, & potential cost savings by using the [facility]? No      3. Are you open to using the [facility] (Type [facility]. No ).   4. Which pharmacy/location (including street and city if local pharmacy) is medication to be sent to? [facility] [address] [place], [place] - [address] AT [facility] [address]    5. Do they need a 30 day or 90 day supply? 90

## 2023-09-12 ENCOUNTER — Other Ambulatory Visit: Payer: Self-pay

## 2023-09-12 MED ORDER — DILTIAZEM HCL ER COATED BEADS 240 MG PO CP24: 240.0000 mg | ORAL_CAPSULE | Freq: Every day | ORAL | 0 refills | Status: DC

## 2023-09-14 ENCOUNTER — Other Ambulatory Visit: Payer: Self-pay

## 2023-09-14 MED ORDER — DILTIAZEM HCL ER COATED BEADS 240 MG PO CP24: 240.0000 mg | ORAL_CAPSULE | Freq: Every day | ORAL | 3 refills | Status: DC

## 2023-10-10 ENCOUNTER — Other Ambulatory Visit: Payer: Self-pay

## 2023-10-10 MED ORDER — POTASSIUM CHLORIDE CRYS ER 20 MEQ PO TBCR: 20.0000 meq | EXTENDED_RELEASE_TABLET | Freq: Every day | ORAL | 2 refills | Status: DC

## 2023-10-24 NOTE — Progress Notes (Addendum)
Cardiology Office Note Date:  10/24/2023  Patient ID:  Emily Nunez, Emily Nunez 03/01/1961, MRN [id number] PCP:  Joycelyn Rua, MD  Electrophysiologist: Dr. Lalla Brothers    Chief Complaint:   62mo, [name] visit  History of Present Illness: Emily Nunez is a 62 y.o. female with history of hypothyroidism, Afib, CM (suspected tachy-mediated), obesity  I saw her 09/28/22 She is doing PT, making progress, no neuropathy pain RLE at all any more, remains with pain L lateral thigh that her therapist thinks is local related, not back. Prior to her ER visit she thinks the last episode of Afib was about 49mo ago, sounds fairly brief, did not last the whole day. No CP SOB No near syncope or syncope. No bleeding No changes made, stable QTc  She saw Dr. Lalla Brothers 06/29/23, pending hip surgery, in pain with expectation of getting the left hip done eventually as well. Hx of some degree of statin intolerance and referred to lipid clinic  Started on Zetia with plans to pursue PCSK9i if auth obtained >> started on repatha  TODAY She had now had both her hips done, most recentlythe L, started walking without cane last week, feels like she is making good progress She is surprised to hear that she is in AFib and gouing fast. Once the MA reported her HRs, she feels like she is now aware of her heart fast, but not having any symptoms otherwise  She held her Eliquis for her hip surgery, resumed on Nov 8th, and she is certain has not missed any doses since then. 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 fib   Hypothyroid     Urinary incontinence    Vitamin D deficiency 09/2010   VIT D = 22    Past Surgical History:  Procedure Laterality Date   BASAL CELL CARCINOMA EXCISION     BREAST BIOPSY Right 12/2018   BREAST EXCISIONAL BIOPSY Right 05/2019   BREAST LUMPECTOMY WITH RADIOACTIVE SEED LOCALIZATION Right 06/05/2019   Procedure: RIGHT BREAST LUMPECTOMY WITH RADIOACTIVE SEED LOCALIZATION;  Surgeon: Claud Kelp, MD;  Location: [facility];  Service: General;  Laterality: Right;   BUBBLE STUDY  06/28/2021   Procedure: BUBBLE STUDY;  Surgeon: Pricilla Riffle, MD;  Location: [facility] ENDOSCOPY;  Service: Cardiovascular;;   CARDIOVERSION N/A 06/28/2021   Procedure: CARDIOVERSION;  Surgeon: Pricilla Riffle, MD;  Location: [facility] ENDOSCOPY;  Service: Cardiovascular;  Laterality: N/A;   COLONOSCOPY     DILATATION & CURETTAGE/HYSTEROSCOPY WITH MYOSURE N/A 06/25/2019   Procedure: DILATATION & CURETTAGE/HYSTEROSCOPY WITH MYOSURE;  Surgeon: Dara Lords, MD;  Location: [facility];  Service: Gynecology;  Laterality: N/A;   ENDOMETRIAL ABLATION  2005   HYDROTHERMAL ABLATION   MAXIMUM ACCESS (MAS)POSTERIOR LUMBAR INTERBODY FUSION (PLIF) 3 LEVEL  07/22/2022   PELVIC LAPAROSCOPY  04/2004   ENDO/CYST   SPINAL FUSION  07/22/2022   TEE WITHOUT CARDIOVERSION N/A 06/28/2021   Procedure: TRANSESOPHAGEAL ECHOCARDIOGRAM (TEE);  Surgeon: Pricilla Riffle, MD;  Location: [facility] ENDOSCOPY;  Service: Cardiovascular;  Laterality: N/A;  TONSILLECTOMY  1966   URETHRAL SLING      Current Outpatient Medications  Medication Sig Dispense Refill   acetaminophen (TYLENOL) 500 MG tablet Take 1,000 mg by mouth every 6 (six) hours as needed for mild pain or fever.     apixaban (ELIQUIS) 5 MG TABS tablet TAKE 1 TABLET(5 MG) BY MOUTH TWICE DAILY 60 tablet 5   diltiazem (CARDIZEM CD) 240 MG 24 hr capsule Take 1 capsule (240 mg total) by mouth daily. 90 capsule 3   dofetilide (TIKOSYN) 500 MCG capsule TAKE ONE CAPSULE BY MOUTH  TWICE A DAY 180 capsule 3   Evolocumab (REPATHA SURECLICK) 140 MG/ML SOAJ Inject 140 mg into the skin every 14 (fourteen) days. 6 mL 3   ezetimibe (ZETIA) 10 MG tablet Take 1 tablet (10 mg total) by mouth daily. 90 tablet 3   levothyroxine (SYNTHROID) 150 MCG tablet Take 150-225 mcg by mouth See admin instructions. Take 150 mcg daily except take 225 mcg on Mon and Tues     MAGNESIUM PO Take by mouth.     Omega-3 Fatty Acids (OMEGA 3 PO) Take 1,350 mg by mouth daily.     potassium chloride SA (KLOR-CON M) 20 MEQ tablet Take 1 tablet (20 mEq total) by mouth daily. 90 tablet 2   Vitamin D, Ergocalciferol, (DRISDOL) 1.25 MG (50000 UNIT) CAPS capsule Take 50,000 Units by mouth every Sunday.     zolpidem (AMBIEN) 10 MG tablet Take 10 mg by mouth at bedtime as needed for sleep.     No current facility-administered medications for this visit.    Allergies:   Vibegron, Ciprofloxacin, Duloxetine, Gabapentin, Macrobid [nitrofurantoin monohydrate macrocrystals], Phentermine, Septra [bactrim], and Sertraline   Social History:  The patient  reports that she has never smoked. She has never used smokeless tobacco. She reports that she does not currently use alcohol. She reports that she does not use drugs.   Family History:  The patient's family history includes Cancer in her mother; Heart disease in her father.  ROS:  Please see the history of present illness.    All other systems are reviewed and otherwise negative.   PHYSICAL EXAM:  VS:  LMP  (LMP Unknown) Comment: not sexually active BMI: There is no height or weight on file to calculate BMI. Well nourished, well developed, in no acute distress HEENT: normocephalic, atraumatic Neck: no JVD, carotid bruits or masses Cardiac:  irreg-irreg, tachycardic, no significant murmurs, no rubs, or gallops Lungs: CTA b/l, no wheezing, rhonchi or rales Abd: soft, nontender MS: no deformity or atrophy Ext: no edema Skin: warm and dry, no rash Neuro:  No gross  deficits appreciated Psych: euthymic mood, full affect  EKG:  Done today and reviewed by myself shows  AFib 135bpm, QTc (by machine measurement)    08/04/21: TTE IMPRESSIONS   1. Left ventricular ejection fraction, by estimation, is 50 to 55%. The  left ventricle has low normal function. The left ventricle has no regional  wall motion abnormalities. Left ventricular diastolic parameters are  indeterminate.   2. Right ventricular systolic function is normal. The right ventricular  size is normal. There is normal pulmonary artery systolic pressure. The  estimated right ventricular systolic pressure is 23.6 mmHg.   3. The mitral valve is normal in structure. Trivial mitral valve  regurgitation. No evidence of mitral stenosis.   4. The aortic valve is normal in structure. Aortic valve regurgitation is  trivial. No aortic stenosis is present.   5. The  inferior vena cava is normal in size with greater than 50%  respiratory variability, suggesting right atrial pressure of 3 mmHg.    06/28/21:TEE=   1. The left ventricle has moderately decreased function.   2. Right ventricular systolic function is mildly reduced. The right  ventricular size is normal.   3. Left atrial size was mildly dilated. No left atrial/left atrial  appendage thrombus was detected.   4. The mitral valve is normal in structure. Trivial mitral valve  regurgitation.   5. The aortic valve is tricuspid. Aortic valve regurgitation is trivial.   6. Agitated saline contrast bubble study was negative, with no evidence  of any interatrial shunt.   Recent Labs: No results found for requested labs within last 365 days.  No results found for requested labs within last 365 days.   CrCl cannot be calculated (Patient's most recent lab result is older than the maximum 21 days allowed.).   Wt Readings from Last 3 Encounters:  06/29/23 240 lb (108.9 kg)  10/04/22 222 lb (100.7 kg)  09/28/22 225 lb (102.1 kg)     Other  studies reviewed: Additional studies/records reviewed today include: summarized above  ASSESSMENT AND PLAN:  Persistent AFib CHA2DS2Vasc is 2 with gender, maintained on Eliquis, appropriately dosed Tikosyn  w/ stable QTc  This is the 1st known AFib episode since starting Tikosyn (2 years) She reports excellent eliquis (and Tikosyn) compliance with no missed doses of her Eliquis since her hip surgery resume 10/06/23  Able to get her DCCV Wed 4th, d/w her, she is agreeable  Toprol 25mg  daily for today and Tuesday only None Wed or going forward post DCCV Discussed ER precautions should she developed any symptoms  D/w EP MD In office today Given minimally if any symptoms, stable BP, agreeable with the plan  Suspect the recent surgeries/stressors may have provoked her AFib If we start to see more, would consider discussion for ablation, she would be more open to that discussion if she has more AFib   Informed Consent   Shared Decision Making/Informed Consent The risks (stroke, cardiac arrhythmias rarely resulting in the need for a temporary or permanent pacemaker, skin irritation or burns and complications associated with conscious sedation including aspiration, arrhythmia, respiratory failure and death), benefits (restoration of normal sinus rhythm) and alternatives of a direct current cardioversion were explained in detail to Ms. Aye and she agrees to proceed.         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 Secondary hypercoagulable state   Disposition:  back in 6 weeks, sooner if needed  Current medicines are reviewed at length with the patient today.  The patient did not have any concerns regarding medicines.  Norma Fredrickson, PA-C 10/24/2023 1:20 PM     [facility] [address] [place] [address] [phone] (office)  [phone] (fax)

## 2023-10-30 ENCOUNTER — Ambulatory Visit: Payer: BC Managed Care – PPO | Attending: Physician Assistant | Admitting: Physician Assistant

## 2023-10-30 ENCOUNTER — Encounter: Payer: Self-pay | Admitting: *Deleted

## 2023-10-30 ENCOUNTER — Encounter: Payer: Self-pay | Admitting: Physician Assistant

## 2023-10-30 VITALS — BP 122/84 | HR 135 | Ht 66.0 in | Wt 237.8 lb

## 2023-10-30 DIAGNOSIS — D6869 Other thrombophilia: Secondary | ICD-10-CM | POA: Diagnosis not present

## 2023-10-30 DIAGNOSIS — Z5181 Encounter for therapeutic drug level monitoring: Secondary | ICD-10-CM

## 2023-10-30 DIAGNOSIS — I420 Dilated cardiomyopathy: Secondary | ICD-10-CM

## 2023-10-30 DIAGNOSIS — I4819 Other persistent atrial fibrillation: Secondary | ICD-10-CM | POA: Diagnosis not present

## 2023-10-30 DIAGNOSIS — Z79899 Other long term (current) drug therapy: Secondary | ICD-10-CM

## 2023-10-30 MED ORDER — METOPROLOL SUCCINATE ER 25 MG PO TB24: 25.0000 mg | ORAL_TABLET | Freq: Every day | ORAL | 0 refills | Status: DC

## 2023-10-30 NOTE — Patient Instructions (Addendum)
Medication Instructions:   FOR TWO DAYS ONLY:  TAKE  TOPROL XL 25 MG ONE TABLET TODAY AND ONE TABLET TOMORROW    THEN STOP: DO NOT TAKE ANYMORE AFTER CARDIOVERSION   *If you need a refill on your cardiac medications before your next appointment, please call your pharmacy*   Lab Work:  BMET MAG AND  CBD TODAY   If you have labs (blood work) drawn today and your tests are completely normal, you will receive your results only by: MyChart Message (if you have MyChart) OR A paper copy in the mail If you have any lab test that is abnormal or we need to change your treatment, we will call you to review the results.   Testing/Procedures: ON 11-01-23  Your physician has recommended that you have a Cardioversion (DCCV). Electrical Cardioversion uses a jolt of electricity to your heart either through paddles or wired patches attached to your chest. This is a controlled, usually prescheduled, procedure. Defibrillation is done under light anesthesia in the hospital, and you usually go home the day of the procedure. This is done to get your heart back into a normal rhythm. You are not awake for the procedure. Please see the instruction sheet given to you today.     Follow-Up: At [facility], you and your health needs are our priority.  As part of our continuing mission to provide you with exceptional heart care, we have created designated Provider Care Teams.  These Care Teams include your primary Cardiologist (physician) and Advanced Practice Providers (APPs -  Physician Assistants and Nurse Practitioners) who all work together to provide you with the care you need, when you need it.  We recommend signing up for the patient portal called "MyChart".  Sign up information is provided on this After Visit Summary.  MyChart is used to connect with patients for Virtual Visits (Telemedicine).  Patients are able to view lab/test results, encounter notes, upcoming appointments, etc.  Non-urgent messages  can be sent to your provider as well.   To learn more about what you can do with MyChart, go to [email or web address].    Your next appointment:    6 WEEKS AFTER 12-4- 24 CARDIOVERSION   Provider:   Steffanie Dunn, MD or Francis Dowse, PA-C  Other Instructions

## 2023-10-31 LAB — CBC
Hematocrit: 35.8 % (ref 34.0–46.6)
Hemoglobin: 11 g/dL — ABNORMAL LOW (ref 11.1–15.9)
MCH: 27.4 pg (ref 26.6–33.0)
MCHC: 30.7 g/dL — ABNORMAL LOW (ref 31.5–35.7)
MCV: 89 fL (ref 79–97)
Platelets: 393 10*3/uL (ref 150–450)
RBC: 4.01 x10E6/uL (ref 3.77–5.28)
RDW: 13.7 % (ref 11.7–15.4)
WBC: 7.4 10*3/uL (ref 3.4–10.8)

## 2023-10-31 LAB — BASIC METABOLIC PANEL
BUN/Creatinine Ratio: 22 (ref 12–28)
BUN: 19 mg/dL (ref 8–27)
CO2: 23 mmol/L (ref 20–29)
Calcium: 9.7 mg/dL (ref 8.7–10.3)
Chloride: 103 mmol/L (ref 96–106)
Creatinine, Ser: 0.88 mg/dL (ref 0.57–1.00)
Glucose: 106 mg/dL — ABNORMAL HIGH (ref 70–99)
Potassium: 4.3 mmol/L (ref 3.5–5.2)
Sodium: 141 mmol/L (ref 134–144)
eGFR: 74 mL/min/{1.73_m2} (ref >59)

## 2023-10-31 LAB — MAGNESIUM: Magnesium: 2 mg/dL (ref 1.6–2.3)

## 2023-10-31 NOTE — Progress Notes (Signed)
Unable to reach patient about procedure, but was able to leave a detailed message. Stated that the patient needed to arrive at the hospital at 0730 , remain NPO after 0000, needs to have a ride home and a responsible adult to stay with them for 24 hours after the procedure. Instructed the patient to call back if they had any questions.

## 2023-11-01 ENCOUNTER — Encounter (HOSPITAL_COMMUNITY)
Admission: RE | Disposition: A | Discharge status: Home / Self Care | Payer: Self-pay | Source: Home / Self Care | Attending: Cardiology

## 2023-11-01 ENCOUNTER — Ambulatory Visit (HOSPITAL_COMMUNITY)
Admission: RE | Admit: 2023-11-01 | Discharge: 2023-11-01 | Disposition: A | Discharge status: Home / Self Care | Payer: BC Managed Care – PPO | Attending: Cardiology | Admitting: Cardiology

## 2023-11-01 DIAGNOSIS — Z539 Procedure and treatment not carried out, unspecified reason: Secondary | ICD-10-CM | POA: Diagnosis not present

## 2023-11-01 DIAGNOSIS — I4891 Unspecified atrial fibrillation: Secondary | ICD-10-CM

## 2023-11-01 DIAGNOSIS — I4819 Other persistent atrial fibrillation: Secondary | ICD-10-CM | POA: Diagnosis present

## 2023-11-01 SURGERY — CARDIOVERSION (CATH LAB)
Anesthesia: General

## 2023-11-01 NOTE — H&P (Signed)
Patient presented for cardioversion for Afib.  Noted on presentation to be in sinus rhythm with rate 70s, confirmed on EKG.  Cardioversion cancelled.  Little Ishikawa, MD

## 2023-11-06 ENCOUNTER — Encounter: Payer: Self-pay | Admitting: Obstetrics and Gynecology

## 2023-11-06 ENCOUNTER — Ambulatory Visit (INDEPENDENT_AMBULATORY_CARE_PROVIDER_SITE_OTHER): Payer: BC Managed Care – PPO | Admitting: Obstetrics and Gynecology

## 2023-11-06 VITALS — BP 122/68 | HR 98 | Ht 65.5 in | Wt 236.0 lb

## 2023-11-06 DIAGNOSIS — Z01419 Encounter for gynecological examination (general) (routine) without abnormal findings: Secondary | ICD-10-CM | POA: Insufficient documentation

## 2023-11-06 NOTE — Assessment & Plan Note (Signed)
Cervical cancer screening performed according to ASCCP guidelines. Encouraged annual mammogram screening Colonoscopy UTD DXA N/A, consider next year given vit D deficiency Labs and immunizations with her primary Encouraged safe sexual practices as indicated Encouraged healthy lifestyle practices with diet and exercise For patients under 50-62yo, I recommend 1200mg  calcium daily and 600IU of vitamin D daily.

## 2023-11-06 NOTE — Progress Notes (Signed)
62 y.o. 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 Widowed, lost her husband 2023. 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  Pt states she lost her husband a year ago and is currently seeing a therapist for her emotional state. Has been in therapy for 9 months. Going well.  Had bilateral hip replacements this year, planning to start at [employer] in the pool this week for exercise.  Postmenopausal bleeding: none Pelvic discharge or pain: none Breast mass, nipple discharge or skin changes : none Last PAP:     Component Value Date/Time   DIAGPAP  10/04/2022 1627    - Negative for Intraepithelial Lesions or Malignancy (NILM)   DIAGPAP - Benign reactive/reparative changes 10/04/2022 1627   DIAGPAP  10/01/2021 0923    - Negative for intraepithelial lesion or malignancy (NILM)   ADEQPAP  10/04/2022 1627    Satisfactory for evaluation; transformation zone component PRESENT.   ADEQPAP  10/01/2021 0923    Satisfactory for evaluation; transformation zone component PRESENT.   Last mammogram: 11/08/22 BIRADS 1, density c, scheduled for 2024 Last colonoscopy: 2023 Last DXA: never Sexually active: no  Exercising: No.  Smoker: no   GYN HISTORY: Right breast lumpectomy, 2020, benign  OB History  Gravida Para Term Preterm AB Living  3 3 3     3   SAB IAB Ectopic Multiple Live Births          3    # Outcome Date GA Lbr Len/2nd Weight Sex Type Anes PTL Lv  3 Term     F Vag-Spont  N LIV  2 Term     F Vag-Spont  N LIV  1 Term     F Vag-Spont  N LIV    Past Medical History:  Diagnosis Date   Abnormality of right breast on screening mammogram 06/05/2019   Atrial fibrillation (HCC)    Cancer (HCC)    BASAL CELL /NOSE   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 fib   Hypothyroid    Urinary incontinence    Vitamin D deficiency 09/2010   VIT D  = 22    Past Surgical History:  Procedure Laterality Date   BASAL CELL CARCINOMA EXCISION     BREAST BIOPSY Right 12/2018   BREAST EXCISIONAL BIOPSY Right 05/2019   BREAST LUMPECTOMY WITH RADIOACTIVE SEED LOCALIZATION Right 06/05/2019   Procedure: RIGHT BREAST LUMPECTOMY WITH RADIOACTIVE SEED LOCALIZATION;  Surgeon: Claud Kelp, MD;  Location: [facility];  Service: General;  Laterality: Right;   BUBBLE STUDY  06/28/2021   Procedure: BUBBLE STUDY;  Surgeon: Pricilla Riffle, MD;  Location: [facility] ENDOSCOPY;  Service: Cardiovascular;;   CARDIOVERSION N/A 06/28/2021   Procedure: CARDIOVERSION;  Surgeon: Pricilla Riffle, MD;  Location: [facility] ENDOSCOPY;  Service: Cardiovascular;  Laterality: N/A;   COLONOSCOPY     DILATATION & CURETTAGE/HYSTEROSCOPY WITH MYOSURE N/A 06/25/2019   Procedure: DILATATION & CURETTAGE/HYSTEROSCOPY WITH MYOSURE;  Surgeon: Dara Lords, MD;  Location: [facility];  Service: Gynecology;  Laterality: N/A;   ENDOMETRIAL ABLATION  2005   HYDROTHERMAL ABLATION   MAXIMUM ACCESS (MAS)POSTERIOR LUMBAR INTERBODY FUSION (PLIF) 3 LEVEL  07/22/2022   PELVIC LAPAROSCOPY  04/2004   ENDO/CYST   SPINAL FUSION  07/22/2022   TEE WITHOUT CARDIOVERSION N/A 06/28/2021   Procedure: TRANSESOPHAGEAL ECHOCARDIOGRAM (TEE);  Surgeon: Pricilla Riffle,  MD;  Location: [facility] ENDOSCOPY;  Service: Cardiovascular;  Laterality: N/A;   TONSILLECTOMY  1966   URETHRAL SLING      Current Outpatient Medications on File Prior to Visit  Medication Sig Dispense Refill   acetaminophen (TYLENOL) 500 MG tablet Take 500 mg by mouth every 6 (six) hours as needed for mild pain (pain score 1-3) or fever.     apixaban (ELIQUIS) 5 MG TABS tablet TAKE 1 TABLET(5 MG) BY MOUTH TWICE DAILY 60 tablet 5   diltiazem (CARDIZEM CD) 240 MG 24 hr capsule Take 1 capsule (240 mg total) by mouth daily. 90 capsule 3   dofetilide (TIKOSYN) 500 MCG capsule TAKE ONE CAPSULE BY MOUTH TWICE A DAY 180 capsule  3   Evolocumab (REPATHA SURECLICK) 140 MG/ML SOAJ Inject 140 mg into the skin every 14 (fourteen) days. 6 mL 3   levothyroxine (SYNTHROID) 150 MCG tablet Take 150-225 mcg by mouth See admin instructions. Take 150 mcg daily except take 225 mcg on Mon and Tues     MAGNESIUM PO Take 1 tablet by mouth daily.     metoprolol succinate (TOPROL XL) 25 MG 24 hr tablet Take 1 tablet (25 mg total) by mouth daily. 30 tablet 0   Omega-3 Fatty Acids (OMEGA 3 PO) Take 1,350 mg by mouth daily.     pantoprazole (PROTONIX) 40 MG tablet Take 40 mg by mouth daily.     potassium chloride SA (KLOR-CON M) 20 MEQ tablet Take 1 tablet (20 mEq total) by mouth daily. 90 tablet 2   Vitamin D, Ergocalciferol, (DRISDOL) 1.25 MG (50000 UNIT) CAPS capsule Take 50,000 Units by mouth every Sunday.     zolpidem (AMBIEN) 10 MG tablet Take 10 mg by mouth at bedtime as needed for sleep.     ezetimibe (ZETIA) 10 MG tablet Take 1 tablet (10 mg total) by mouth daily. 90 tablet 3   No current facility-administered medications on file prior to visit.    Social History   Socioeconomic History   Marital status: Widowed    Spouse name: Not on file   Number of children: Not on file   Years of education: Not on file   Highest education level: Not on file  Occupational History   Not on file  Tobacco Use   Smoking status: Never   Smokeless tobacco: Never  Vaping Use   Vaping status: Never Used  Substance and Sexual Activity   Alcohol use: Not Currently   Drug use: No   Sexual activity: Not Currently    Partners: Male    Birth control/protection: Post-menopausal    Comment: -1st intercourse 62 yo-Fewer than 5 partners  Other Topics Concern   Not on file  Social History Narrative   Not on file   Social Determinants of Health   Financial Resource Strain: Low Risk  (10/04/2023)   Received from [facility]   Overall Financial Resource Strain (CARDIA)    Difficulty of Paying Living Expenses: Not very hard   Food Insecurity: No Food Insecurity (10/04/2023)   Received from [facility]   Hunger Vital Sign    Worried About Running Out of Food in the Last Year: Never true    Ran Out of Food in the Last Year: Never true  Transportation Needs: Unknown (10/04/2023)   Received from [facility] - Transportation    In the past 12 months, has lack of transportation kept you from medical appointments or from  getting medications?: No    Lack of Transportation (Non-Medical): Not on file  Physical Activity: Not on file  Stress: Not on file  Social Connections: Unknown (04/08/2022)   Received from [facility]   Social Network    Social Network: Not on file  Intimate Partner Violence: Unknown (02/28/2022)   Received from [facility]   HITS    Physically Hurt: Not on file    Insult or Talk Down To: Not on file    Threaten Physical Harm: Not on file    Scream or Curse: Not on file    Family History  Problem Relation Age of Onset   Cancer Mother        COLON   Heart disease Father     Allergies  Allergen Reactions   Rosuvastatin Diarrhea    Nausea, diarrhea, and just feels awful     Vibegron Other (See Comments) and Nausea Only   Ciprofloxacin Nausea Only   Duloxetine Other (See Comments)    Exhaustion, nausea, headache   Gabapentin     Felt weird    Macrobid [Nitrofurantoin Monohydrate Macrocrystals] Nausea Only   Phentermine     Dizziness (intolerance)   Septra [Bactrim] Nausea Only   Sertraline Other (See Comments)    Unknown      PE Today's Vitals   11/06/23 1352  BP: 122/68  Pulse: 98  SpO2: 97%  Weight: 236 lb (107 kg)  Height: 5' 5.5" (1.664 m)   Body mass index is 38.68 kg/m.  Physical Exam Vitals reviewed. Exam conducted with a chaperone present.  Constitutional:      General: She is not in acute distress.    Appearance: Normal appearance.  HENT:     Head: Normocephalic and atraumatic.      Nose: Nose normal.  Eyes:     Extraocular Movements: Extraocular movements intact.     Conjunctiva/sclera: Conjunctivae normal.  Neck:     Thyroid: No thyroid mass, thyromegaly or thyroid tenderness.  Pulmonary:     Effort: Pulmonary effort is normal.  Chest:     Chest wall: No mass or tenderness.  Breasts:    Right: Normal. No swelling, mass, nipple discharge, skin change or tenderness.     Left: Normal. No swelling, mass, nipple discharge, skin change or tenderness.       Comments: Right breast lumpectomy scar Abdominal:     General: There is no distension.     Palpations: Abdomen is soft.     Tenderness: There is no abdominal tenderness.  Genitourinary:    General: Normal vulva.     Exam position: Lithotomy position.     Urethra: No prolapse.     Vagina: Normal. No vaginal discharge or bleeding.     Cervix: Normal. No lesion.     Uterus: Normal. Not enlarged and not tender.      Adnexa: Right adnexa normal and left adnexa normal.  Musculoskeletal:        General: Normal range of motion.     Cervical back: Normal range of motion.  Lymphadenopathy:     Upper Body:     Right upper body: No axillary adenopathy.     Left upper body: No axillary adenopathy.     Lower Body: No right inguinal adenopathy. No left inguinal adenopathy.  Skin:    General: Skin is warm and dry.  Neurological:     General: No focal deficit present.     Mental Status: She is  alert.  Psychiatric:        Mood and Affect: Mood normal.        Behavior: Behavior normal.       Assessment and Plan:        Well woman exam with routine gynecological exam Assessment & Plan: Cervical cancer screening performed according to ASCCP guidelines. Encouraged annual mammogram screening Colonoscopy UTD DXA N/A, consider next year given vit D deficiency Labs and immunizations with her primary Encouraged safe sexual practices as indicated Encouraged healthy lifestyle practices with diet and exercise For  patients under 50-70yo, I recommend 1200mg  calcium daily and 600IU of vitamin D daily.    Rosalyn Gess, MD

## 2023-11-06 NOTE — Patient Instructions (Signed)

## 2023-11-09 ENCOUNTER — Telehealth: Payer: Self-pay | Admitting: Pharmacist

## 2023-11-09 NOTE — Telephone Encounter (Signed)
Call pt to remind for Lipid lab and LFT - started Repatha and Zetia Aug 2024

## 2023-11-13 ENCOUNTER — Other Ambulatory Visit: Payer: Self-pay

## 2023-11-13 ENCOUNTER — Ambulatory Visit
Admission: RE | Admit: 2023-11-13 | Discharge: 2023-11-13 | Disposition: A | Discharge status: Home / Self Care | Payer: BC Managed Care – PPO | Source: Ambulatory Visit | Attending: Family Medicine | Admitting: Family Medicine

## 2023-11-13 DIAGNOSIS — Z1231 Encounter for screening mammogram for malignant neoplasm of breast: Secondary | ICD-10-CM

## 2023-11-18 ENCOUNTER — Telehealth: Payer: Self-pay | Admitting: Student in an Organized Health Care Education/Training Program

## 2023-11-19 NOTE — Telephone Encounter (Signed)
Patient called requesting refill diltiazem. She has [facility] refills [facility] to [facility]. She will call today.

## 2023-11-28 ENCOUNTER — Telehealth: Payer: Self-pay | Admitting: Pharmacist

## 2023-11-28 LAB — HEPATIC FUNCTION PANEL
ALT: 13 [IU]/L (ref 0–32)
AST: 15 [IU]/L (ref 0–40)
Albumin: 4.4 g/dL (ref 3.9–4.9)
Alkaline Phosphatase: 114 [IU]/L (ref 44–121)
Bilirubin Total: <0.2 mg/dL (ref 0.0–1.2)
Bilirubin, Direct: <0.08 mg/dL (ref 0.00–0.40)
Total Protein: 7.3 g/dL (ref 6.0–8.5)

## 2023-11-28 LAB — LIPID PANEL
Chol/HDL Ratio: 2.4 {ratio} (ref 0.0–4.4)
Cholesterol, Total: 188 mg/dL (ref 100–199)
HDL: 78 mg/dL (ref >39)
LDL Chol Calc (NIH): 85 mg/dL (ref 0–99)
Triglycerides: 146 mg/dL (ref 0–149)
VLDL Cholesterol Cal: 25 mg/dL (ref 5–40)

## 2023-11-28 NOTE — Telephone Encounter (Signed)
 Last LDLc 209 , TC 298, TG 140, HDL 63 (06/12/2023 -[facility]) started Repatha  and Zetia  aug 2024 updated LDLc 85, TG 146, TC 188 and HDL 78  Risk Factors: dyslipidemia, 10 years ASCVD risk score 3.9%  LDL goal: <100 mg /dl  LFT - WNL   Lab result discussed with patient advised to continue with current therapy

## 2023-12-04 ENCOUNTER — Telehealth: Payer: Self-pay

## 2023-12-04 DIAGNOSIS — Z79899 Other long term (current) drug therapy: Secondary | ICD-10-CM

## 2023-12-04 DIAGNOSIS — E7849 Other hyperlipidemia: Secondary | ICD-10-CM

## 2023-12-04 DIAGNOSIS — E7800 Pure hypercholesterolemia, unspecified: Secondary | ICD-10-CM

## 2023-12-04 NOTE — Telephone Encounter (Signed)
-----   Message from Rossie Muskrat [facility] sent at 11/30/2023  3:32 PM EST ----- Can you put in a referral for her to see the lipid clinic? Thanks!  Sheria Lang T. Lalla Brothers, MD, [facility], [facility] Cardiac Electrophysiology

## 2023-12-04 NOTE — Telephone Encounter (Signed)
 Referral placed.

## 2023-12-20 ENCOUNTER — Ambulatory Visit: Payer: BC Managed Care – PPO | Admitting: Cardiology

## 2024-01-10 ENCOUNTER — Ambulatory Visit: Payer: Self-pay

## 2024-01-28 ENCOUNTER — Other Ambulatory Visit: Payer: Self-pay | Admitting: Physician Assistant

## 2024-02-16 ENCOUNTER — Other Ambulatory Visit: Payer: Self-pay

## 2024-02-16 ENCOUNTER — Ambulatory Visit: Payer: 59

## 2024-02-16 MED ORDER — DILTIAZEM HCL ER COATED BEADS 240 MG PO CP24: 240.0000 mg | ORAL_CAPSULE | Freq: Every day | ORAL | 2 refills | Status: DC

## 2024-03-11 ENCOUNTER — Other Ambulatory Visit: Payer: Self-pay

## 2024-03-11 DIAGNOSIS — I4819 Other persistent atrial fibrillation: Secondary | ICD-10-CM

## 2024-03-11 MED ORDER — APIXABAN 5 MG PO TABS: ORAL_TABLET | ORAL | 5 refills | Status: DC

## 2024-03-11 NOTE — Telephone Encounter (Signed)
 Prescription refill request for Eliquis received. Indication:afib Last office visit:12/24 Scr:0.88  12/24 Age: 63 Weight:107  kg  Prescription refilled

## 2024-04-05 ENCOUNTER — Ambulatory Visit: Attending: Cardiology | Admitting: Pharmacist

## 2024-04-05 ENCOUNTER — Other Ambulatory Visit (HOSPITAL_COMMUNITY): Payer: Self-pay

## 2024-04-05 ENCOUNTER — Telehealth: Payer: Self-pay

## 2024-04-05 ENCOUNTER — Telehealth: Payer: Self-pay | Admitting: Pharmacist

## 2024-04-05 ENCOUNTER — Encounter: Payer: Self-pay | Admitting: Pharmacist

## 2024-04-05 DIAGNOSIS — E7800 Pure hypercholesterolemia, unspecified: Secondary | ICD-10-CM | POA: Diagnosis not present

## 2024-04-05 NOTE — Telephone Encounter (Signed)
 PA request has been Submitted. New Encounter has been or will be created for follow up. For additional info see Pharmacy Prior Auth telephone encounter from 04/05/24.

## 2024-04-05 NOTE — Patient Instructions (Addendum)
 Your Results:             Your most recent labs Goal  Total Cholesterol 188 < 200  Triglycerides 146 < 150  HDL (happy/good cholesterol) 78 > 40  LDL (lousy/bad cholesterol 85 < 70   Medication changes:  Will start assessment for Nexlizet 180/10 mg. Once you go on that new medication discontinue ezetimibe  as it is already in [employer] your Uric acid checked today and we will repeat in one month  We will re-check your cholesterol in 8-12 weeks after therapy modification

## 2024-04-05 NOTE — Telephone Encounter (Signed)
 Pharmacy Patient Advocate Encounter  Received notification from [facility] that Prior Authorization for NEXLIZET has been APPROVED from 04/05/24 to 04/05/27. Ran test claim, Copay is $25. This test claim was processed through [facility]- copay amounts may vary at other pharmacies due to pharmacy/plan contracts, or as the patient moves through the different stages of their insurance plan.

## 2024-04-05 NOTE — Assessment & Plan Note (Signed)
 Assessment:  LDL goal: < 70 mg/dl last LDLc 85  mg/dl (29/5284) Tolerates Zetia  and Repatha   well without any side effects  Intolerance to statins  Discussed next potential options bempedoic acid; cost, dosing efficacy, side effects    Plan: Continue taking current medications (Repatha  and Zetia ) as prescribed  Will apply for PA for Nexlizet ; will inform patient upon approval  Get baseline uric acid today and will repeat in 1 month  Once start Nexlizet stop taking Zetia  10 mg daily  Will repeat lipid lab in 8-12 weeks post therapy modification

## 2024-04-05 NOTE — Progress Notes (Signed)
 Patient ID: Emily Nunez                 DOB: 1961/10/24                    MRN: [id number]      HPI: Emily Nunez is a 63 y.o. female patient referred to lipid clinic by [facility]. PMH is significant for PAF, dilated cardiomyopathy, dyslipidemia, hypothyroidism   Patient saw Dr. Marven Slimmer on 06/29/2023. Patient brought in her lipid results from PCP - LDLc and TC elevated. Patient reports she has been intolerant to multiple statins , including rosuvastatin and atorvastatin secondary to upset stomach and myalgia. 06/2023 patient saw me Repatha  was added to Zetia  LDL decreased from 209 to 85 mg/dl from Repatha . It is slightly above the goal (<70 mg/dl) today we reviewed bempedoic acid.  Discussed mechanisms of action, dosing, side effects and potential decreases in LDL cholesterol.  Also reviewed cost information and potential options for patient assistance.   Current Medications: Repatha  140 mg every 14 days, and Zetia  10 mg daily  Intolerances: satins -Crestor, Lipitor - diarrhea, myalgia  Risk Factors: possible FH due to Pretreatment LDL lavel >190 (LDLc 209 , TC 298, TG 140, HDL 63 (06/12/2023 -[facility])   LDL goal: <70   Diet:  Breakfast: protein bar and water  Lunch; sandwich, grapes, chips  Dinner: chillie, side salads Drink: water only   Exercise: walk at work   Family History:  Relation Problem Comments  Mother (Deceased) Cancer COLON    Father (Deceased) Heart disease at age 76     Grandmother died from MI at age of 59  Social History:  Alcohol: 1-2 drink per week  Smoking: never  Labs:  Lipid Panel     Component Value Date/Time   CHOL 188 11/27/2023 1138   TRIG 146 11/27/2023 1138   HDL 78 11/27/2023 1138   CHOLHDL 2.4 11/27/2023 1138   LDLCALC 85 11/27/2023 1138   LABVLDL 25 11/27/2023 1138    Past Medical History:  Diagnosis Date   Abnormality of right breast on screening mammogram 06/05/2019   Atrial fibrillation (HCC)    Cancer (HCC)    BASAL CELL /NOSE    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 fib   Hypothyroid    Urinary incontinence    Vitamin D  deficiency 09/2010   VIT D = 22    Current Outpatient Medications on File Prior to Visit  Medication Sig Dispense Refill   acetaminophen  (TYLENOL ) 500 MG tablet Take 500 mg by mouth every 6 (six) hours as needed for mild pain (pain score 1-3) or fever.     apixaban  (ELIQUIS ) 5 MG TABS tablet TAKE 1 TABLET(5 MG) BY MOUTH TWICE DAILY 60 tablet 5   diltiazem  (CARDIZEM  CD) 240 MG 24 hr capsule Take 1 capsule (240 mg total) by mouth daily. 90 capsule 2   dofetilide  (TIKOSYN ) 500 MCG capsule Take 1 capsule by mouth twice daily 180 capsule 2   Evolocumab  (REPATHA  SURECLICK) 140 MG/ML SOAJ Inject 140 mg into the skin every 14 (fourteen) days. 6 mL 3   ezetimibe  (ZETIA ) 10 MG tablet Take 1 tablet (10 mg total) by mouth daily. 90 tablet 3   levothyroxine  (SYNTHROID ) 150 MCG tablet Take 150-225 mcg by mouth See admin instructions. Take 150 mcg daily except take 225 mcg on Mon and Tues     MAGNESIUM  PO Take 1 tablet by mouth daily.  metoprolol  succinate (TOPROL  XL) 25 MG 24 hr tablet Take 1 tablet (25 mg total) by mouth daily. 30 tablet 0   Omega-3 Fatty Acids (OMEGA 3 PO) Take 1,350 mg by mouth daily.     pantoprazole  (PROTONIX ) 40 MG tablet Take 40 mg by mouth daily.     potassium chloride  SA (KLOR-CON  M) 20 MEQ tablet Take 1 tablet (20 mEq total) by mouth daily. 90 tablet 2   Vitamin D , Ergocalciferol , (DRISDOL ) 1.25 MG (50000 UNIT) CAPS capsule Take 50,000 Units by mouth every Sunday.     zolpidem  (AMBIEN ) 10 MG tablet Take 10 mg by mouth at bedtime as needed for sleep.     No current facility-administered medications on file prior to visit.    Allergies  Allergen Reactions   Rosuvastatin Diarrhea    Nausea, diarrhea, and just feels awful     Vibegron Other (See Comments) and Nausea Only   Ciprofloxacin  Nausea Only   Duloxetine Other (See Comments)    Exhaustion, nausea,  headache   Gabapentin      Felt weird    Macrobid [Nitrofurantoin Monohydrate Macrocrystals] Nausea Only   Phentermine     Dizziness (intolerance)   Septra [Bactrim] Nausea Only   Sertraline Other (See Comments)    Unknown    Assessment/Plan:  1. Hyperlipidemia -  Problem  Hypercholesteremia   Current Medications: Repatha  140 mg [place] every 14 days and Zetia  10 mg daily  Intolerances: Crestor, Lipitor - diarrhea, myalgia  Risk Factors: PAF, dilated cardiomyopathy, dyslipidemia, 10 years ASCVD risk score 3.9%  LDL goal: <100 mg /dl  LDLc 098 , TC 119, TG 140, HDL 63 (06/12/2023 -[facility]) while not on therapy LDLc  85, HDL 78, TG 146, TC 188 (11/27/2023) while on Zetia  & Repatha      Hypercholesteremia Assessment:  LDL goal: < 70 mg/dl last LDLc 85  mg/dl (14/7829) Tolerates Zetia  and Repatha   well without any side effects  Intolerance to statins  Discussed next potential options bempedoic acid; cost, dosing efficacy, side effects    Plan: Continue taking current medications (Repatha  and Zetia ) as prescribed  Will apply for PA for Nexlizet ; will inform patient upon approval  Get baseline uric acid today and will repeat in 1 month  Once start Nexlizet stop taking Zetia  10 mg daily  Will repeat lipid lab in 8-12 weeks post therapy modification     Thank you,  Nickola Baron, Pharm.D [place] [name] [facility] [address], [place], [place] [address] Phone: [phone]; Fax: [phone]

## 2024-04-05 NOTE — Telephone Encounter (Signed)
 Pharmacy Patient Advocate Encounter   Received notification from Physician's Office that prior authorization for NEXLIZET is required/requested.   Insurance verification completed.   The patient is insured through [facility] .   Per test claim: PA required; PA submitted to above mentioned insurance via CoverMyMeds Key/confirmation #/EOC [facility] Status is pending

## 2024-04-06 LAB — URIC ACID: Uric Acid: 5.3 mg/dL (ref 3.0–7.2)

## 2024-04-08 ENCOUNTER — Other Ambulatory Visit: Payer: Self-pay | Admitting: Pharmacist

## 2024-04-08 MED ORDER — REPATHA SURECLICK 140 MG/ML ~~LOC~~ SOAJ: 140.0000 mg | SUBCUTANEOUS | 3 refills | Status: AC

## 2024-04-08 MED ORDER — NEXLIZET 180-10 MG PO TABS: 1.0000 | ORAL_TABLET | Freq: Every day | ORAL | 11 refills | Status: AC

## 2024-04-08 NOTE — Addendum Note (Signed)
 Addended by: Chip Canepa K on: 04/08/2024 09:49 AM   Modules accepted: Orders

## 2024-05-28 ENCOUNTER — Other Ambulatory Visit: Payer: Self-pay | Admitting: *Deleted

## 2024-05-28 NOTE — Telephone Encounter (Signed)
 Opened encounter for refill for Zetia , pt no longer on it.

## 2024-06-19 NOTE — Telephone Encounter (Signed)
 Follow up lipid and uric acid lab is due. LVM to remind patient.

## 2024-06-19 NOTE — Addendum Note (Signed)
 Addended by: Jaymen Fetch K on: 06/19/2024 03:05 PM   Modules accepted: Orders

## 2024-06-25 LAB — LIPID PANEL
Chol/HDL Ratio: 2.1 ratio (ref 0.0–4.4)
Cholesterol, Total: 112 mg/dL (ref 100–199)
HDL: 53 mg/dL (ref >39)
LDL Chol Calc (NIH): 32 mg/dL (ref 0–99)
Triglycerides: 165 mg/dL — ABNORMAL HIGH (ref 0–149)
VLDL Cholesterol Cal: 27 mg/dL (ref 5–40)

## 2024-06-25 LAB — URIC ACID: Uric Acid: 7.3 mg/dL — ABNORMAL HIGH (ref 3.0–7.2)

## 2024-06-26 ENCOUNTER — Ambulatory Visit: Payer: Self-pay | Admitting: Pharmacist

## 2024-07-01 ENCOUNTER — Other Ambulatory Visit: Payer: Self-pay

## 2024-07-01 MED ORDER — POTASSIUM CHLORIDE CRYS ER 20 MEQ PO TBCR: 20.0000 meq | EXTENDED_RELEASE_TABLET | Freq: Every day | ORAL | 1 refills | Status: AC

## 2024-07-26 ENCOUNTER — Telehealth: Payer: Self-pay | Admitting: Cardiology

## 2024-07-26 NOTE — Telephone Encounter (Signed)
 Pt c/o medication issue:  1. Name of Medication: Bempedoic Acid-Ezetimibe  (NEXLIZET ) 180-10 MG TABS   2. How are you currently taking this medication (dosage and times per day)? As directed  3. Are you having a reaction (difficulty breathing--STAT)? no  4. What is your medication issue? Patient is having a hard time getting this medication refilled. She states that the pharmacy is saying it is denied. Please advise.

## 2024-07-26 NOTE — Telephone Encounter (Signed)
 Returned call. Stated [place], [place] and [place] [employer] can not accept messages.

## 2024-08-01 ENCOUNTER — Other Ambulatory Visit (HOSPITAL_COMMUNITY): Payer: Self-pay

## 2024-08-01 NOTE — Telephone Encounter (Signed)
 Left message for patient to call back

## 2024-08-01 NOTE — Telephone Encounter (Signed)
 Per test claim drug is covered. Copay is $10 for 1 month.PA approval on file till 04/05/27. Is patient asking for a refill?

## 2024-09-08 ENCOUNTER — Other Ambulatory Visit: Payer: Self-pay | Admitting: Cardiology

## 2024-09-20 ENCOUNTER — Other Ambulatory Visit: Payer: Self-pay | Admitting: Obstetrics and Gynecology

## 2024-09-20 DIAGNOSIS — Z1231 Encounter for screening mammogram for malignant neoplasm of breast: Secondary | ICD-10-CM

## 2024-10-04 NOTE — Telephone Encounter (Signed)
 Patient called back to r/s phone screening, I advised her to call back in January. Patient does not want to wait and wants to been this year.  [phone]

## 2024-10-04 NOTE — Telephone Encounter (Signed)
 Letter sent as we can not schedule screenings at this time

## 2024-10-04 NOTE — Telephone Encounter (Signed)
 LVM for patient to call and reschedule colonoscopy OA phone visit.

## 2024-10-05 ENCOUNTER — Encounter: Payer: Self-pay | Admitting: Emergency Medicine

## 2024-10-05 ENCOUNTER — Ambulatory Visit
Admission: EM | Admit: 2024-10-05 | Discharge: 2024-10-05 | Disposition: A | Discharge status: Home / Self Care | Attending: Family Medicine | Admitting: Family Medicine

## 2024-10-05 ENCOUNTER — Ambulatory Visit: Admitting: Radiology

## 2024-10-05 DIAGNOSIS — J069 Acute upper respiratory infection, unspecified: Secondary | ICD-10-CM

## 2024-10-05 DIAGNOSIS — R051 Acute cough: Secondary | ICD-10-CM

## 2024-10-05 DIAGNOSIS — J189 Pneumonia, unspecified organism: Secondary | ICD-10-CM | POA: Diagnosis not present

## 2024-10-05 LAB — POC COVID19/FLU A&B COMBO
Covid Antigen, POC: NEGATIVE
Influenza A Antigen, POC: NEGATIVE
Influenza B Antigen, POC: NEGATIVE

## 2024-10-05 MED ORDER — DOXYCYCLINE HYCLATE 100 MG PO CAPS: 100.0000 mg | ORAL_CAPSULE | Freq: Two times a day (BID) | ORAL | 0 refills | Status: AC

## 2024-10-05 MED ORDER — GUAIFENESIN-CODEINE 100-10 MG/5ML PO SOLN: 5.0000 mL | Freq: Four times a day (QID) | ORAL | 0 refills | Status: DC | PRN

## 2024-10-05 NOTE — ED Triage Notes (Signed)
 Pt c/o congestion, fatigue, headache, ear fullness, since coughing since Thursday

## 2024-10-05 NOTE — ED Provider Notes (Addendum)
 [facility]    CSN: [id number] Arrival date & time: 10/05/24  1226      History   Chief Complaint No chief complaint on file.   HPI Emily Nunez is a 63 y.o. female.   HPI Here for cough and nasal congestion and chest congestion.  Symptoms began on November 6.  She has not noted any fever.  No nausea or vomiting or diarrhea.  She states that the cough is a very deep cough and she feels tight in her chest.  She is having some headache.  She also has been very tired  Past medical history is significant for atrial fibrillation, and she takes Eliquis .  She states she has never been diagnosed with asthma or COPD.  She has not heard wheezing since this began.  Allergies include Cipro  and nitrofurantoin and sulfa. Past Medical History:  Diagnosis Date   Abnormality of right breast on screening mammogram 06/05/2019   Atrial fibrillation (HCC)    Cancer (HCC)    BASAL CELL /NOSE   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 fib   Hypothyroid    Urinary incontinence    Vitamin D  deficiency 09/2010   VIT D = 22    Patient Active Problem List   Diagnosis Date Noted   Well woman exam with routine gynecological exam 11/06/2023   Hypercholesteremia 07/14/2023   UTI (urinary tract infection) 07/28/2022   Lumbar radiculopathy, acute 07/27/2022   Atrial fibrillation with rapid ventricular response (HCC) 07/26/2022   Intractable back pain 07/26/2022   Spondylolisthesis at L4-L5 level 07/22/2022   Afib (HCC) 08/03/2021   Persistent atrial fibrillation (HCC) 05/28/2021   Abnormality of right breast on screening mammogram 06/05/2019   Hypothyroid    Cancer (HCC)    Vitamin D  deficiency     Past Surgical History:  Procedure Laterality Date   BASAL CELL CARCINOMA EXCISION     BREAST BIOPSY Right 12/2018   BREAST EXCISIONAL BIOPSY Right 05/2019   BREAST LUMPECTOMY WITH RADIOACTIVE SEED LOCALIZATION Right 06/05/2019   Procedure: RIGHT BREAST  LUMPECTOMY WITH RADIOACTIVE SEED LOCALIZATION;  Surgeon: Gail Favorite, MD;  Location: [facility];  Service: General;  Laterality: Right;   BUBBLE STUDY  06/28/2021   Procedure: BUBBLE STUDY;  Surgeon: Okey Vina GAILS, MD;  Location: [facility] ENDOSCOPY;  Service: Cardiovascular;;   CARDIOVERSION N/A 06/28/2021   Procedure: CARDIOVERSION;  Surgeon: Okey Vina GAILS, MD;  Location: [facility] ENDOSCOPY;  Service: Cardiovascular;  Laterality: N/A;   COLONOSCOPY     DILATATION & CURETTAGE/HYSTEROSCOPY WITH MYOSURE N/A 06/25/2019   Procedure: DILATATION & CURETTAGE/HYSTEROSCOPY WITH MYOSURE;  Surgeon: Rockney Evalene SQUIBB, MD;  Location: [facility];  Service: Gynecology;  Laterality: N/A;   ENDOMETRIAL ABLATION  2005   HYDROTHERMAL ABLATION   MAXIMUM ACCESS (MAS)POSTERIOR LUMBAR INTERBODY FUSION (PLIF) 3 LEVEL  07/22/2022   PELVIC LAPAROSCOPY  04/2004   ENDO/CYST   SPINAL FUSION  07/22/2022   TEE WITHOUT CARDIOVERSION N/A 06/28/2021   Procedure: TRANSESOPHAGEAL ECHOCARDIOGRAM (TEE);  Surgeon: Okey Vina GAILS, MD;  Location: [facility] ENDOSCOPY;  Service: Cardiovascular;  Laterality: N/A;   TONSILLECTOMY  1966   URETHRAL SLING      OB History     Gravida  3   Para  3   Term  3   Preterm      AB      Living  3      SAB  IAB      Ectopic      Multiple      Live Births  3            Home Medications    Prior to Admission medications   Medication Sig Start Date End Date Taking? Authorizing Provider  doxycycline (VIBRAMYCIN) 100 MG capsule Take 1 capsule (100 mg total) by mouth 2 (two) times daily for 7 days. 10/05/24 10/12/24 Yes Duana Benedict K, MD  guaiFENesin-codeine 100-10 MG/5ML syrup Take 5 mLs by mouth every 6 (six) hours as needed for cough. 10/05/24  Yes Joellyn Grandt, Sharlet POUR, MD  acetaminophen  (TYLENOL ) 500 MG tablet Take 500 mg by mouth every 6 (six) hours as needed for mild pain (pain score 1-3) or fever.    [provider]  apixaban   (ELIQUIS ) 5 MG TABS tablet TAKE 1 TABLET(5 MG) BY MOUTH TWICE DAILY 03/11/24   Cindie Ole DASEN, MD  Bempedoic Acid-Ezetimibe  (NEXLIZET ) 180-10 MG TABS Take 1 tablet by mouth daily. 04/08/24   Cindie Ole DASEN, MD  diltiazem  (CARDIZEM  CD) 240 MG 24 hr capsule TAKE ONE CAPSULE BY MOUTH ONE TIME DAILY 09/10/24   Cindie Ole DASEN, MD  dofetilide  (TIKOSYN ) 500 MCG capsule Take 1 capsule by mouth twice daily 01/31/24   Ursuy, Renee Lynn, PA-C  Evolocumab  (REPATHA  SURECLICK) 140 MG/ML SOAJ Inject 140 mg into the skin every 14 (fourteen) days. 04/08/24   Cindie Ole DASEN, MD  levothyroxine  (SYNTHROID ) 150 MCG tablet Take 150-225 mcg by mouth See admin instructions. Take 150 mcg daily except take 225 mcg on Mon and Tues    [provider]  MAGNESIUM  PO Take 1 tablet by mouth daily.    [provider]  metoprolol  succinate (TOPROL  XL) 25 MG 24 hr tablet Take 1 tablet (25 mg total) by mouth daily. 10/30/23   Leverne Charlies Helling, PA-C  Omega-3 Fatty Acids (OMEGA 3 PO) Take 1,350 mg by mouth daily.    [provider]  potassium chloride  SA (KLOR-CON  M) 20 MEQ tablet Take 1 tablet (20 mEq total) by mouth daily. 07/01/24   Cindie Ole DASEN, MD  Vitamin D , Ergocalciferol , (DRISDOL ) 1.25 MG (50000 UNIT) CAPS capsule Take 50,000 Units by mouth every Sunday. 01/15/21   [provider]  zolpidem  (AMBIEN ) 10 MG tablet Take 10 mg by mouth at bedtime as needed for sleep.    [provider]    Family History Family History  Problem Relation Age of Onset   Cancer Mother        COLON   Heart disease Father     Social History Social History   Tobacco Use   Smoking status: Never   Smokeless tobacco: Never  Vaping Use   Vaping status: Never Used  Substance Use Topics   Alcohol use: Not Currently   Drug use: No     Allergies   Rosuvastatin, Vibegron, Ciprofloxacin , Duloxetine, Gabapentin , Macrobid [nitrofurantoin monohydrate macrocrystals], Phentermine, Septra  [bactrim], and Sertraline   Review of Systems Review of Systems   Physical Exam Triage Vital Signs ED Triage Vitals  Encounter Vitals Group     BP 10/05/24 1303 116/80     Girls Systolic BP Percentile --      Girls Diastolic BP Percentile --      Boys Systolic BP Percentile --      Boys Diastolic BP Percentile --      Pulse Rate 10/05/24 1303 88     Resp 10/05/24 1303 17  Temp 10/05/24 1303 98 F (36.7 C)     Temp Source 10/05/24 1303 Oral     SpO2 10/05/24 1303 93 %     Weight --      Height --      Head Circumference --      Peak Flow --      Pain Score 10/05/24 1302 4     Pain Loc --      Pain Education --      Exclude from Growth Chart --    No data found.  Updated Vital Signs BP 116/80 (BP Location: Right Arm)   Pulse 88   Temp 98 F (36.7 C) (Oral)   Resp 17   LMP  (LMP Unknown) Comment: not sexually active  SpO2 93%   Visual Acuity Right Eye Distance:   Left Eye Distance:   Bilateral Distance:    Right Eye Near:   Left Eye Near:    Bilateral Near:     Physical Exam Vitals reviewed.  Constitutional:      General: She is not in acute distress.    Appearance: She is not ill-appearing, toxic-appearing or diaphoretic.  HENT:     Right Ear: Tympanic membrane and ear canal normal.     Left Ear: Tympanic membrane and ear canal normal.     Nose: Congestion present.     Mouth/Throat:     Mouth: Mucous membranes are moist.     Comments: No erythema of the oropharynx.  There is clear exudate draining Eyes:     Extraocular Movements: Extraocular movements intact.     Conjunctiva/sclera: Conjunctivae normal.     Pupils: Pupils are equal, round, and reactive to light.  Cardiovascular:     Rate and Rhythm: Normal rate and regular rhythm.     Heart sounds: No murmur heard. Pulmonary:     Effort: No respiratory distress.     Breath sounds: No stridor. No wheezing, rhonchi or rales.     Comments: Breath sounds are coarse but air movement is  good. Musculoskeletal:     Cervical back: Neck supple.  Lymphadenopathy:     Cervical: No cervical adenopathy.  Skin:    Capillary Refill: Capillary refill takes less than 2 seconds.     Coloration: Skin is not jaundiced or pale.  Neurological:     General: No focal deficit present.     Mental Status: She is alert and oriented to person, place, and time.  Psychiatric:        Behavior: Behavior normal.      UC Treatments / Results  Labs (all labs ordered are listed, but only abnormal results are displayed) Labs Reviewed  POC COVID19/FLU A&B COMBO    EKG   Radiology DG Chest 2 View Result Date: 10/05/2024 EXAM: 2 VIEW(S) XRAY OF THE CHEST 10/05/2024 01:40:07 PM COMPARISON: 05/17/2021. CLINICAL HISTORY: cough, chest tightness x 2 days FINDINGS: LUNGS AND PLEURA: Low lung volumes with resultant pulmonary interstitial prominence. No focal pulmonary opacity. No pulmonary edema. No pleural effusion. No pneumothorax. HEART AND MEDIASTINUM: Cardiomegaly. BONES AND SOFT TISSUES: No acute osseous abnormality. IMPRESSION: 1. Low lung volumes with resultant pulmonary interstitial prominence. 2. Cardiomegaly. Electronically signed by: Rockey Kilts MD 10/05/2024 02:17 PM EST RP Workstation: [id number]    Procedures Procedures (including critical care time)  Medications Ordered in UC Medications - No data to display  Initial Impression / Assessment and Plan / UC Course  I have reviewed the triage vital signs and the nursing  notes.  Pertinent labs & imaging results that were available during my care of the patient were reviewed by me and considered in my medical decision making (see chart for details).     By my review there is a possible right lower lobe infiltrate.  She is advised to radiology overread.  Doxycycline is sent in for possible pneumonia and Robitussin with codeine is sent in for cough.  Of note she was unhappy that I asked her to wait the 30 minutes after the x-ray had  been done to see if radiology could give us  a report before she left.  She did acquiesce to stay.  She states I want to have to have some very strong medicine to make this get better.  Her COVID and flu test was negative.  I have sent in medication for possible pneumonia.  I want her to take it even if she is negative for pneumonia on the chest x-ray report.  At discharge, she stated are you sure I do not need prednisone .  Of note I did not hear any wheezing on exam and her lungs were clear.  She also did not give a history of wheezing and she stated she had never had asthma or COPD.  I do not see that there is an indication for steroids at this time. To the ER if worse Follow-up with primary care  Of note after the patient had just been discharged radiology report came through and confirmed that there was some low lung volumes that cause some appearance of possible pulmonary congestion which would explain my seeing possible infiltrate.  I standby my decision to treat her with antibiotics.    Final Clinical Impressions(s) / UC Diagnoses   Final diagnoses:  Acute cough  Community acquired pneumonia of right lower lobe of lung  Viral URI     Discharge Instructions      By my review, there is a probable pneumonia in your right lower lung.  The radiologist will also read your x-ray, and if their interpretation differs significantly from mine, and the management of your condition would change, we will call you.  The tests for flu and COVID were negative I still think a different viral illness probably started all this.  Take doxycycline 100 mg --1 capsule 2 times daily for 7 days  Robitussin with codeine cough syrup--take 5 mL or 1 teaspoon every 6 hours as needed for cough.  Make sure you are getting plenty of fluids and  These medicines do not interact with your current medications.  Please go to the emergency room if you start feeling worse in any way.  Follow-up with  your primary care     ED Prescriptions     Medication Sig Dispense Auth. Provider   doxycycline (VIBRAMYCIN) 100 MG capsule Take 1 capsule (100 mg total) by mouth 2 (two) times daily for 7 days. 14 capsule Annaly Skop K, MD   guaiFENesin-codeine 100-10 MG/5ML syrup Take 5 mLs by mouth every 6 (six) hours as needed for cough. 120 mL Vonna Sharlet POUR, MD      I have reviewed the PDMP during this encounter.   Vonna Sharlet POUR, MD 10/05/24 1534    Vonna Sharlet POUR, MD 10/05/24 1536

## 2024-10-05 NOTE — Discharge Instructions (Signed)
 By my review, there is a probable pneumonia in your right lower lung.  The radiologist will also read your x-ray, and if their interpretation differs significantly from mine, and the management of your condition would change, we will call you.  The tests for flu and COVID were negative I still think a different viral illness probably started all this.  Take doxycycline 100 mg --1 capsule 2 times daily for 7 days  Robitussin with codeine cough syrup--take 5 mL or 1 teaspoon every 6 hours as needed for cough.  Make sure you are getting plenty of fluids and  These medicines do not interact with your current medications.  Please go to the emergency room if you start feeling worse in any way.  Follow-up with your primary care

## 2024-10-08 ENCOUNTER — Telehealth: Payer: Self-pay | Admitting: Pharmacy Technician

## 2024-10-08 NOTE — Telephone Encounter (Signed)
 Pharmacy Patient Advocate Encounter  Received notification from [facility] that Prior Authorization for repatha  has been APPROVED from 10/08/24 to 10/08/25   PA #/Case ID/Reference #: [id number]

## 2024-10-08 NOTE — Telephone Encounter (Signed)
   Pharmacy Patient Advocate Encounter   Received notification from CoverMyMeds that prior authorization for repatha  is required/requested.   Insurance verification completed.   The patient is insured through [facility].   Per test claim: PA required; PA submitted to above mentioned insurance via Latent Key/confirmation #/EOC [id number] Status is pending

## 2024-10-15 ENCOUNTER — Other Ambulatory Visit: Payer: Self-pay | Admitting: Cardiology

## 2024-10-15 DIAGNOSIS — I4819 Other persistent atrial fibrillation: Secondary | ICD-10-CM

## 2024-10-15 MED ORDER — APIXABAN 5 MG PO TABS: ORAL_TABLET | ORAL | 5 refills | Status: AC

## 2024-10-15 NOTE — Telephone Encounter (Signed)
 Prescription refill request for Eliquis  received. Indication:afib Last office visit:5/25 Scr:0.88  12/24 Age: 63 Weight:107  kg  Prescription refilled

## 2024-11-06 ENCOUNTER — Other Ambulatory Visit: Payer: Self-pay | Admitting: Physician Assistant

## 2024-11-13 ENCOUNTER — Other Ambulatory Visit: Payer: Self-pay | Admitting: Physician Assistant

## 2024-11-25 ENCOUNTER — Ambulatory Visit: Admitting: Obstetrics and Gynecology

## 2024-11-25 ENCOUNTER — Encounter: Payer: Self-pay | Admitting: Obstetrics and Gynecology

## 2024-11-25 VITALS — BP 106/62 | HR 73 | Temp 98.1°F | Ht 67.0 in | Wt 270.0 lb

## 2024-11-25 DIAGNOSIS — Z01419 Encounter for gynecological examination (general) (routine) without abnormal findings: Secondary | ICD-10-CM | POA: Diagnosis not present

## 2024-11-25 DIAGNOSIS — Z1331 Encounter for screening for depression: Secondary | ICD-10-CM | POA: Diagnosis not present

## 2024-11-25 NOTE — Assessment & Plan Note (Signed)
 Cervical cancer screening performed according to ASCCP guidelines. Encouraged annual mammogram screening Colonoscopy UTD DXA N/A, consider before 63yo given hx of vit D deficiency Labs and immunizations with her primary Encouraged safe sexual practices as indicated Encouraged healthy lifestyle practices with diet and exercise For patients under 50-70yo, I recommend 1200mg  calcium daily and 600IU of vitamin D  daily.

## 2024-11-25 NOTE — Patient Instructions (Signed)
 For patients under 50-63yo, I recommend 1200mg  calcium  daily and 600IU of vitamin D daily. For patients over 63yo, I recommend 1200mg  calcium  daily and 800IU of vitamin D daily.  Health Maintenance, Female Adopting a healthy lifestyle and getting preventive care are important in promoting health and wellness. Ask your health care provider about: The right schedule for you to have regular tests and exams. Things you can do on your own to prevent diseases and keep yourself healthy. What should I know about diet, weight, and exercise? Eat a healthy diet  Eat a diet that includes plenty of vegetables, fruits, low-fat dairy products, and lean protein. Do not eat a lot of foods that are high in solid fats, added sugars, or sodium. Maintain a healthy weight Body mass index (BMI) is used to identify weight problems. It estimates body fat based on height and weight. Your health care provider can help determine your BMI and help you achieve or maintain a healthy weight. Get regular exercise Get regular exercise. This is one of the most important things you can do for your health. Most adults should: Exercise for at least 150 minutes each week. The exercise should increase your heart rate and make you sweat (moderate-intensity exercise). Do strengthening exercises at least twice a week. This is in addition to the moderate-intensity exercise. Spend less time sitting. Even light physical activity can be beneficial. Watch cholesterol and blood lipids Have your blood tested for lipids and cholesterol at 63 years of age, then have this test every 5 years. Have your cholesterol levels checked more often if: Your lipid or cholesterol levels are high. You are older than 63 years of age. You are at high risk for heart disease. What should I know about cancer screening? Depending on your health history and family history, you may need to have cancer screening at various ages. This may include screening  for: Breast cancer. Cervical cancer. Colorectal cancer. Skin cancer. Lung cancer. What should I know about heart disease, diabetes, and high blood pressure? Blood pressure and heart disease High blood pressure causes heart disease and increases the risk of stroke. This is more likely to develop in people who have high blood pressure readings or are overweight. Have your blood pressure checked: Every 3-5 years if you are 25-57 years of age. Every year if you are 24 years old or older. Diabetes Have regular diabetes screenings. This checks your fasting blood sugar level. Have the screening done: Once every three years after age 62 if you are at a normal weight and have a low risk for diabetes. More often and at a younger age if you are overweight or have a high risk for diabetes. What should I know about preventing infection? Hepatitis B If you have a higher risk for hepatitis B, you should be screened for this virus. Talk with your health care provider to find out if you are at risk for hepatitis B infection. Hepatitis C Testing is recommended for: Everyone born from [birth year] through 1965. Anyone with known risk factors for hepatitis C. Sexually transmitted infections (STIs) Get screened for STIs, including gonorrhea and chlamydia, if: You are sexually active and are younger than 63 years of age. You are older than 63 years of age and your health care provider tells you that you are at risk for this type of infection. Your sexual activity has changed since you were last screened, and you are at increased risk for chlamydia or gonorrhea. Ask your health care provider if  you are at risk. Ask your health care provider about whether you are at high risk for HIV. Your health care provider may recommend a prescription medicine to help prevent HIV infection. If you choose to take medicine to prevent HIV, you should first get tested for HIV. You should then be tested every 3 months for as long as you  are taking the medicine. Osteoporosis and menopause Osteoporosis is a disease in which the bones lose minerals and strength with aging. This can result in bone fractures. If you are 72 years old or older, or if you are at risk for osteoporosis and fractures, ask your health care provider if you should: Be screened for bone loss. Take a calcium  or vitamin D supplement to lower your risk of fractures. Be given hormone replacement therapy (HRT) to treat symptoms of menopause. Follow these instructions at home: Alcohol use Do not drink alcohol if: Your health care provider tells you not to drink. You are pregnant, may be pregnant, or are planning to become pregnant. If you drink alcohol: Limit how much you have to: 0-1 drink a day. Know how much alcohol is in your drink. In the [place] one drink equals one 12 oz bottle of beer (355 mL), one 5 oz glass of wine (148 mL), or one 1 oz glass of hard liquor (44 mL). Lifestyle Do not use any products that contain nicotine or tobacco. These products include cigarettes, chewing tobacco, and vaping devices, such as e-cigarettes. If you need help quitting, ask your health care provider. Do not use street drugs. Do not share needles. Ask your health care provider for help if you need support or information about quitting drugs. General instructions Schedule regular health, dental, and eye exams. Stay current with your vaccines. Tell your health care provider if: You often feel depressed. You have ever been abused or do not feel safe at home. Summary Adopting a healthy lifestyle and getting preventive care are important in promoting health and wellness. Follow your health care provider's instructions about healthy diet, exercising, and getting tested or screened for diseases. Follow your health care provider's instructions on monitoring your cholesterol and blood pressure. This information is not intended to replace advice given to you by your health  care provider. Make sure you discuss any questions you have with your health care provider. Document Revised: 04/05/2021 Document Reviewed: 04/05/2021 Elsevier Patient Education  2024 [employer].

## 2024-11-25 NOTE — Progress Notes (Signed)
 "  63 y.o. [id number] postmenopausal female with situational depression, atrial fibrillation on anticoagulation, OAB (managed by urology) here for annual exam. Widowed, lost her husband 2023. Retired runner, broadcasting/film/video, now engineer, manufacturing. PCP: Nanci Senior, MD   Had bilateral hip replacements in 2024. She fell walking in to the exam room. She denies headache or extremity pain during our visit. She reports no concerns for her appointment.  She is followed by urology for OAB, managed with botox instillations  Postmenopausal bleeding: none Pelvic discharge or pain: none Breast mass, nipple discharge or skin changes : none  Last PAP:     Component Value Date/Time   DIAGPAP  10/04/2022 1627    - Negative for Intraepithelial Lesions or Malignancy (NILM)   DIAGPAP - Benign reactive/reparative changes 10/04/2022 1627   DIAGPAP  10/01/2021 0923    - Negative for intraepithelial lesion or malignancy (NILM)   ADEQPAP  10/04/2022 1627    Satisfactory for evaluation; transformation zone component PRESENT.   ADEQPAP  10/01/2021 0923    Satisfactory for evaluation; transformation zone component PRESENT.  Last mammogram: 11/13/23 Birads 1, Density C - scheduled tomorrow  Last colonoscopy: 2023, scheduled April 2026 Last DXA: never  Sexually active: no  Exercising: No.  Smoker: no   GYN HISTORY: Right breast lumpectomy, 2020, benign  OB History  Gravida Para Term Preterm AB Living  3 3 3   3   SAB IAB Ectopic Multiple Live Births      3    # Outcome Date GA Lbr Len/2nd Weight Sex Type Anes PTL Lv  3 Term     F Vag-Spont  N LIV  2 Term     F Vag-Spont  N LIV  1 Term     F Vag-Spont  N LIV    Past Medical History:  Diagnosis Date   Abnormality of right breast on screening mammogram 06/05/2019   Atrial fibrillation (HCC)    Cancer (HCC)    BASAL CELL /NOSE   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 fib   Hypothyroid    Urinary incontinence    Vitamin D  deficiency  09/2010   VIT D = 22    Past Surgical History:  Procedure Laterality Date   BASAL CELL CARCINOMA EXCISION     BREAST BIOPSY Right 12/2018   BREAST EXCISIONAL BIOPSY Right 05/2019   BREAST LUMPECTOMY WITH RADIOACTIVE SEED LOCALIZATION Right 06/05/2019   Procedure: RIGHT BREAST LUMPECTOMY WITH RADIOACTIVE SEED LOCALIZATION;  Surgeon: Gail Favorite, MD;  Location: [facility];  Service: General;  Laterality: Right;   BUBBLE STUDY  06/28/2021   Procedure: BUBBLE STUDY;  Surgeon: Okey Vina GAILS, MD;  Location: [facility] ENDOSCOPY;  Service: Cardiovascular;;   CARDIOVERSION N/A 06/28/2021   Procedure: CARDIOVERSION;  Surgeon: Okey Vina GAILS, MD;  Location: [facility] ENDOSCOPY;  Service: Cardiovascular;  Laterality: N/A;   COLONOSCOPY     DILATATION & CURETTAGE/HYSTEROSCOPY WITH MYOSURE N/A 06/25/2019   Procedure: DILATATION & CURETTAGE/HYSTEROSCOPY WITH MYOSURE;  Surgeon: Rockney Evalene SQUIBB, MD;  Location: [facility];  Service: Gynecology;  Laterality: N/A;   ENDOMETRIAL ABLATION  2005   HYDROTHERMAL ABLATION   MAXIMUM ACCESS (MAS)POSTERIOR LUMBAR INTERBODY FUSION (PLIF) 3 LEVEL  07/22/2022   PELVIC LAPAROSCOPY  04/2004   ENDO/CYST   SPINAL FUSION  07/22/2022   TEE WITHOUT CARDIOVERSION N/A 06/28/2021   Procedure: TRANSESOPHAGEAL ECHOCARDIOGRAM (TEE);  Surgeon: Okey Vina GAILS, MD;  Location: [facility] ENDOSCOPY;  Service: Cardiovascular;  Laterality: N/A;   TONSILLECTOMY  1966   URETHRAL SLING      Current Outpatient Medications on File Prior to Visit  Medication Sig Dispense Refill   acetaminophen  (TYLENOL ) 500 MG tablet Take 500 mg by mouth every 6 (six) hours as needed for mild pain (pain score 1-3) or fever.     apixaban  (ELIQUIS ) 5 MG TABS tablet TAKE 1 TABLET(5 MG) BY MOUTH TWICE DAILY 60 tablet 5   Bempedoic Acid-Ezetimibe  (NEXLIZET ) 180-10 MG TABS Take 1 tablet by mouth daily. 30 tablet 11   diltiazem  (CARDIZEM  CD) 240 MG 24 hr capsule TAKE ONE CAPSULE BY MOUTH ONE TIME  DAILY 90 capsule 0   dofetilide  (TIKOSYN ) 500 MCG capsule Take 1 capsule by mouth twice daily 60 capsule 0   Evolocumab  (REPATHA  SURECLICK) 140 MG/ML SOAJ Inject 140 mg into the skin every 14 (fourteen) days. 6 mL 3   levothyroxine  (SYNTHROID ) 150 MCG tablet Take 150-225 mcg by mouth See admin instructions. Take 150 mcg daily except take 225 mcg on Mon and Tues     MAGNESIUM  PO Take 1 tablet by mouth daily.     Omega-3 Fatty Acids (OMEGA 3 PO) Take 1,350 mg by mouth daily.     potassium chloride  SA (KLOR-CON  M) 20 MEQ tablet Take 1 tablet (20 mEq total) by mouth daily. 90 tablet 1   Vitamin D , Ergocalciferol , (DRISDOL ) 1.25 MG (50000 UNIT) CAPS capsule Take 50,000 Units by mouth every Sunday.     zolpidem  (AMBIEN ) 10 MG tablet Take 10 mg by mouth at bedtime as needed for sleep.     No current facility-administered medications on file prior to visit.    Social History   Socioeconomic History   Marital status: Widowed    Spouse name: Not on file   Number of children: Not on file   Years of education: Not on file   Highest education level: Not on file  Occupational History   Not on file  Tobacco Use   Smoking status: Never   Smokeless tobacco: Never  Vaping Use   Vaping status: Never Used  Substance and Sexual Activity   Alcohol use: Not Currently   Drug use: No   Sexual activity: Not Currently    Partners: Male    Birth control/protection: Post-menopausal    Comment: -1st intercourse 63 yo-Fewer than 5 partners  Other Topics Concern   Not on file  Social History Narrative   Not on file   Social Drivers of Health   Tobacco Use: Low Risk (11/25/2024)   Patient History    Smoking Tobacco Use: Never    Smokeless Tobacco Use: Never    Passive Exposure: Not on file  Financial Resource Strain: Low Risk  (10/04/2023)   Received from [facility]   Overall Financial Resource Strain (CARDIA)    Difficulty of Paying Living Expenses: Not very hard  Food  Insecurity: Low Risk (06/20/2024)   Received from [facility]   Epic    Within the past 12 months, you worried that your food would run out before you got money to buy more: Never true    Within the past 12 months, the food you bought just didn't last and you didn't have money to get more. : Never true  Transportation Needs: No Transportation Needs (06/20/2024)   Received from [facility]    In the past 12 months, has lack of reliable transportation kept you from medical appointments, meetings, work or from  getting things needed for daily living? : No  Physical Activity: Not on file  Stress: Not on file  Social Connections: Unknown (04/08/2022)   Received from [facility]   Social Network    Social Network: Not on file  Intimate Partner Violence: Unknown (02/28/2022)   Received from [facility]   HITS    Physically Hurt: Not on file    Insult or Talk Down To: Not on file    Threaten Physical Harm: Not on file    Scream or Curse: Not on file  Depression (PHQ2-9): Low Risk (11/25/2024)   Depression (PHQ2-9)    PHQ-2 Score: 0  Alcohol Screen: Not on file  Housing: Low Risk (06/20/2024)   Received from [facility]   Epic    What is your living situation today?: I have a steady place to live    Think about the place you live. Do you have problems with any of the following? Choose all that apply:: None/None on this list  Utilities: Low Risk (06/20/2024)   Received from [facility]   Utilities    In the past 12 months has the electric, gas, oil, or water company threatened to shut off services in your home? : No  Health Literacy: Not on file    Family History  Problem Relation Age of Onset   Cancer Mother        COLON   Heart disease Father     Allergies  Allergen Reactions   Rosuvastatin Diarrhea    Nausea, diarrhea, and just feels awful     Vibegron Other (See Comments) and Nausea Only   Ciprofloxacin  Nausea Only   Duloxetine Other (See Comments)     Exhaustion, nausea, headache   Gabapentin      Felt weird    Macrobid [Nitrofurantoin Monohydrate Macrocrystals] Nausea Only   Phentermine     Dizziness (intolerance)   Septra [Bactrim] Nausea Only   Sertraline Other (See Comments)    Unknown      PE Today's Vitals   11/25/24 1351  BP: 106/62  Pulse: 73  Temp: 98.1 F (36.7 C)  TempSrc: Oral  SpO2: 95%  Weight: 270 lb (122.5 kg)  Height: 5' 7 (1.702 m)   Body mass index is 42.29 kg/m.  Physical Exam Vitals reviewed. Exam conducted with a chaperone present.  Constitutional:      General: She is not in acute distress.    Appearance: Normal appearance.  HENT:     Head: Normocephalic and atraumatic.     Nose: Nose normal.  Eyes:     Extraocular Movements: Extraocular movements intact.     Conjunctiva/sclera: Conjunctivae normal.  Pulmonary:     Effort: Pulmonary effort is normal.  Chest:     Chest wall: No mass or tenderness.  Breasts:    Right: Normal. No swelling, mass, nipple discharge, skin change or tenderness.     Left: Normal. No swelling, mass, nipple discharge, skin change or tenderness.       Comments: Right breast lumpectomy scar Abdominal:     General: There is no distension.     Palpations: Abdomen is soft.     Tenderness: There is no abdominal tenderness.  Genitourinary:    General: Normal vulva.     Exam position: Lithotomy position.     Urethra: No prolapse.     Vagina: Normal. No vaginal discharge or bleeding.     Cervix: Normal. No lesion.     Uterus: Normal. Not enlarged and not  tender.      Adnexa: Right adnexa normal and left adnexa normal.  Musculoskeletal:        General: Normal range of motion.  Lymphadenopathy:     Upper Body:     Right upper body: No axillary adenopathy.     Left upper body: No axillary adenopathy.     Lower Body: No right inguinal adenopathy. No left inguinal adenopathy.  Skin:    General: Skin is warm and dry.  Neurological:     General: No focal  deficit present.     Mental Status: She is alert.  Psychiatric:        Mood and Affect: Mood normal.        Behavior: Behavior normal.      Assessment and Plan:        Well woman exam with routine gynecological exam Assessment & Plan: Cervical cancer screening performed according to ASCCP guidelines. Encouraged annual mammogram screening Colonoscopy UTD DXA N/A, consider before 63yo given hx of vit D deficiency Labs and immunizations with her primary Encouraged safe sexual practices as indicated Encouraged healthy lifestyle practices with diet and exercise For patients under 50-70yo, I recommend 1200mg  calcium daily and 600IU of vitamin D  daily.    Negative depression screening   Vera LULLA Pa, MD  "

## 2024-11-26 ENCOUNTER — Ambulatory Visit
Admission: RE | Admit: 2024-11-26 | Discharge: 2024-11-26 | Disposition: A | Discharge status: Home / Self Care | Source: Ambulatory Visit | Attending: Obstetrics and Gynecology | Admitting: Obstetrics and Gynecology

## 2024-11-26 ENCOUNTER — Telehealth: Payer: Self-pay | Admitting: Cardiology

## 2024-11-26 DIAGNOSIS — Z1231 Encounter for screening mammogram for malignant neoplasm of breast: Secondary | ICD-10-CM

## 2024-11-26 NOTE — Telephone Encounter (Signed)
 Spoke with Emily Nunez to discuss pt complaints.  Per NP pt continue on current medications and continue monitoring HR.  Schedule pt follow up appointment for further review and recommendation. Pt advised of these recommendations and verbalizes understanding.

## 2024-11-26 NOTE — Telephone Encounter (Signed)
 Patient c/o Palpitations:  STAT if patient reporting lightheadedness, shortness of breath, or chest pain  How long have you had palpitations/irregular HR/ Afib? Are you having the symptoms now?   Yes  Are you currently experiencing lightheadedness, SOB or CP?   SOB  Do you have a history of afib (atrial fibrillation) or irregular heart rhythm?    Yes  Have you checked your BP or HR? (document readings if available):   106/62  HR 78 at her GYN appointment today  Are you experiencing any other symptoms?   No  Patient is concerned she has been in Afib and thinks her medication needs to be adjusted.

## 2024-11-26 NOTE — Telephone Encounter (Signed)
 Spoke with pt who complains of possible intermittent episodes of Afib over the last 24 hours.  Pt  states HR has been as high as 106 but currently 82.  Pt reports she is taking Dofetilide , Diltiazem  and Eliquis  as prescribed.  She has recently consumed more caffeine.  She denies current CP or dizziness but does note some shortness of breath. Pt advised Dr Cindie is out of the office this week.  Due to the late nature of her call will try to arrange appointment and contact pt in the morning. Reviewed ED precautions.  Pt verbalizes understanding and agrees with current plan.

## 2024-11-29 ENCOUNTER — Ambulatory Visit: Payer: Self-pay | Admitting: Obstetrics and Gynecology

## 2024-12-06 ENCOUNTER — Encounter (HOSPITAL_COMMUNITY): Payer: Self-pay | Admitting: Internal Medicine

## 2024-12-06 ENCOUNTER — Ambulatory Visit (HOSPITAL_COMMUNITY)
Admission: RE | Admit: 2024-12-06 | Discharge: 2024-12-06 | Disposition: A | Source: Ambulatory Visit | Attending: Internal Medicine | Admitting: Internal Medicine

## 2024-12-06 VITALS — BP 126/88 | HR 84 | Ht 67.0 in | Wt 261.6 lb

## 2024-12-06 DIAGNOSIS — Z79899 Other long term (current) drug therapy: Secondary | ICD-10-CM | POA: Diagnosis not present

## 2024-12-06 DIAGNOSIS — D6869 Other thrombophilia: Secondary | ICD-10-CM

## 2024-12-06 DIAGNOSIS — I4819 Other persistent atrial fibrillation: Secondary | ICD-10-CM | POA: Diagnosis not present

## 2024-12-06 DIAGNOSIS — Z5181 Encounter for therapeutic drug level monitoring: Secondary | ICD-10-CM

## 2024-12-06 DIAGNOSIS — I4891 Unspecified atrial fibrillation: Secondary | ICD-10-CM

## 2024-12-06 DIAGNOSIS — I480 Paroxysmal atrial fibrillation: Secondary | ICD-10-CM

## 2024-12-06 MED ORDER — DILTIAZEM HCL ER COATED BEADS 240 MG PO CP24: 240.0000 mg | ORAL_CAPSULE | Freq: Every day | ORAL | 3 refills | Status: AC

## 2024-12-06 MED ORDER — DOFETILIDE 500 MCG PO CAPS: 500.0000 ug | ORAL_CAPSULE | Freq: Two times a day (BID) | ORAL | 2 refills | Status: AC

## 2024-12-06 NOTE — Patient Instructions (Signed)
 Wear monitor for 14 days remove 12/20/24 and send in the mail   May use a Kardia mobile - 1 lead device

## 2024-12-06 NOTE — Progress Notes (Signed)
 "   Primary Care Physician: Nanci Senior, MD Primary Cardiologist: None Electrophysiologist: OLE ONEIDA HOLTS, MD (Inactive)     Referring Physician: Daphne Barrack, NP     Emily Nunez is a 64 y.o. female with a history of HDL, recovered EF, and persistent atrial fibrillation who presents for consultation in the [facility].  Patient is on Tikosyn  500 mcg twice daily since 2022.  She contacted office on 11/26/2024 noting possible intermittent A-fib episodes.  Patient is on Eliquis  for stroke prevention.  On evaluation today, patient is currently in NSR.  She stopped alcohol approximately 3 months ago.  She stopped coffee last week after her most recent A-fib episode.  She is currently being evaluated for sleep apnea.  No missed doses of Tikosyn  or Eliquis .  Today, she denies symptoms of chest pain, shortness of breath, orthopnea, PND, lower extremity edema, dizziness, presyncope, syncope, bleeding, or neurologic sequela. The patient is tolerating medications without difficulties and is otherwise without complaint today.    she has a BMI of Body mass index is 40.97 kg/m.SABRA Filed Weights   12/06/24 0839  Weight: 118.7 kg    Current Outpatient Medications  Medication Sig Dispense Refill   acetaminophen  (TYLENOL ) 500 MG tablet Take 500 mg by mouth every 6 (six) hours as needed for mild pain (pain score 1-3) or fever.     apixaban  (ELIQUIS ) 5 MG TABS tablet TAKE 1 TABLET(5 MG) BY MOUTH TWICE DAILY 60 tablet 5   Bempedoic Acid-Ezetimibe  (NEXLIZET ) 180-10 MG TABS Take 1 tablet by mouth daily. 30 tablet 11   diltiazem  (CARDIZEM  CD) 240 MG 24 hr capsule Take 1 capsule (240 mg total) by mouth daily. 90 capsule 3   dofetilide  (TIKOSYN ) 500 MCG capsule Take 1 capsule (500 mcg total) by mouth 2 (two) times daily. 180 capsule 2   Evolocumab  (REPATHA  SURECLICK) 140 MG/ML SOAJ Inject 140 mg into the skin every 14 (fourteen) days. 6 mL 3   levothyroxine  (SYNTHROID ) 150  MCG tablet Take 150-225 mcg by mouth See admin instructions. Take 150 mcg daily except take 225 mcg on Mon and Tues     MAGNESIUM  PO Take 1 tablet by mouth daily.     Omega-3 Fatty Acids (OMEGA 3 PO) Take 1,350 mg by mouth daily.     potassium chloride  SA (KLOR-CON  M) 20 MEQ tablet Take 1 tablet (20 mEq total) by mouth daily. 90 tablet 1   Vitamin D , Ergocalciferol , (DRISDOL ) 1.25 MG (50000 UNIT) CAPS capsule Take 50,000 Units by mouth every Sunday.     zolpidem  (AMBIEN ) 10 MG tablet Take 10 mg by mouth at bedtime as needed for sleep.     No current facility-administered medications for this encounter.    Atrial Fibrillation Management history:  Previous antiarrhythmic drugs: Tikosyn  Previous cardioversions: 06/28/2021 Previous ablations: none Anticoagulation history: Eliquis    ROS- All systems are reviewed and negative except as per the HPI above.  Physical Exam: BP 126/88   Pulse 84   Ht 5' 7 (1.702 m)   Wt 118.7 kg   LMP  (LMP Unknown) Comment: not sexually active  BMI 40.97 kg/m   GEN: Well nourished, well developed in no acute distress NECK: No JVD; No carotid bruits CARDIAC: Regular rate and rhythm, no murmurs, rubs, gallops RESPIRATORY:  Clear to auscultation without rales, wheezing or rhonchi  ABDOMEN: Soft, non-tender, non-distended EXTREMITIES:  No edema; No deformity   EKG today demonstrates   EKG Interpretation Date/Time:  Friday December 06 2024  08:42:18 EST Ventricular Rate:  84 PR Interval:  168 QRS Duration:  92 QT Interval:  404 QTC Calculation: 477 R Axis:   14  Text Interpretation: Normal sinus rhythm Cannot rule out Anterior infarct , age undetermined Abnormal ECG When compared with ECG of 01-Nov-2023 08:46, PREVIOUS ECG IS PRESENT Confirmed by Terra Pac ([id number]) on 12/06/2024 8:55:56 AM        Echo 08/04/2021 demonstrated   1. Left ventricular ejection fraction, by estimation, is 50 to 55%. The  left ventricle has low normal function. The left  ventricle has no regional  wall motion abnormalities. Left ventricular diastolic parameters are  indeterminate.   2. Right ventricular systolic function is normal. The right ventricular  size is normal. There is normal pulmonary artery systolic pressure. The  estimated right ventricular systolic pressure is 23.6 mmHg.   3. The mitral valve is normal in structure. Trivial mitral valve  regurgitation. No evidence of mitral stenosis.   4. The aortic valve is normal in structure. Aortic valve regurgitation is  trivial. No aortic stenosis is present.   5. The inferior vena cava is normal in size with greater than 50%  respiratory variability, suggesting right atrial pressure of 3 mmHg.   ASSESSMENT & PLAN CHA2DS2-VASc Score = 2  The patient's score is based upon: CHF History: 1 HTN History: 0 Diabetes History: 0 Stroke History: 0 Vascular Disease History: 0 Age Score: 0 Gender Score: 1       ASSESSMENT AND PLAN: Persistent Atrial Fibrillation (ICD10:  I48.19) The patient's CHA2DS2-VASc score is 2, indicating a 2.2% annual risk of stroke.    Patient is currently in NSR.  We discussed weight loss reduction and her evaluation for sleep apnea as possibly a successful way to decrease A-fib burden.  She is currently on Tikosyn  500 mcg twice daily for rhythm control.  After discussion, we will place a 2-week ZIO monitor to assess A-fib burden.  I will help patient establish with a new EP; if monitor shows significant A-fib burden can discuss potential candidacy for ablation.   High risk medication monitoring (ICD10: [id number]) Patient requires ongoing monitoring for anti-arrhythmic medication which has the potential to cause life threatening arrhythmias or AV block. Qtc stable. Continue Tikosyn  500 mcg twice daily. BMET and mag drawn today.    Referral to EP to establish care; discuss candidacy for ablation if monitor shows increased burden.    Terra Pac, [facility] [address] [place], [place] [address] [phone]  "

## 2024-12-07 LAB — BASIC METABOLIC PANEL WITH GFR
BUN/Creatinine Ratio: 24 (ref 12–28)
BUN: 24 mg/dL (ref 8–27)
CO2: 25 mmol/L (ref 20–29)
Calcium: 9.8 mg/dL (ref 8.7–10.3)
Chloride: 102 mmol/L (ref 96–106)
Creatinine, Ser: 1 mg/dL (ref 0.57–1.00)
Glucose: 94 mg/dL (ref 70–99)
Potassium: 4.6 mmol/L (ref 3.5–5.2)
Sodium: 141 mmol/L (ref 134–144)
eGFR: 63 mL/min/1.73

## 2024-12-07 LAB — MAGNESIUM: Magnesium: 2.1 mg/dL (ref 1.6–2.3)

## 2024-12-12 ENCOUNTER — Ambulatory Visit (HOSPITAL_COMMUNITY): Payer: Self-pay | Admitting: Internal Medicine

## 2024-12-31 ENCOUNTER — Telehealth: Payer: Self-pay | Admitting: Emergency Medicine

## 2024-12-31 NOTE — Telephone Encounter (Signed)
-----   Message from Nurse Nancy DEL, RN sent at 12/06/2024 11:23 AM EST ----- Regarding: appt Pt will  need appt to discuss ablation. Thanks -Karlene RN [facility]

## 2024-12-31 NOTE — Telephone Encounter (Signed)
 Call x1; lvmtcb + MyChart message sent to patient to schedule w/ new EP MD to establish care from Dr. Cindie; and to discuss candidacy for ablation if monitor shows increased burden per Thom Heinrich, PA.

## 2025-01-01 NOTE — Telephone Encounter (Signed)
 Spoke w/ patient - she is scheduled for 3/27 with Dr. Nancey to discuss ablation candidacy and establish care.   Stacy/Karlene - she would like a call back to discuss the results of her heart monitor. 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

## 2025-01-31 ENCOUNTER — Ambulatory Visit: Admitting: Pulmonary Disease

## 2025-02-21 ENCOUNTER — Ambulatory Visit: Admitting: Cardiovascular Disease

## 2025-11-26 ENCOUNTER — Ambulatory Visit: Admitting: Obstetrics and Gynecology
